# Patient Record
Sex: Female | Born: 1945 | Race: White | Hispanic: No | Smoking: Former smoker
Health system: Southern US, Community
[De-identification: ages and names within clinical notes are randomized; demographics above are authoritative.]

## PROBLEM LIST (undated history)

## (undated) DIAGNOSIS — I10 Essential (primary) hypertension: Secondary | ICD-10-CM

## (undated) DIAGNOSIS — F419 Anxiety disorder, unspecified: Secondary | ICD-10-CM

## (undated) DIAGNOSIS — I509 Heart failure, unspecified: Secondary | ICD-10-CM

## (undated) DIAGNOSIS — Z87891 Personal history of nicotine dependence: Principal | ICD-10-CM

## (undated) DIAGNOSIS — E785 Hyperlipidemia, unspecified: Secondary | ICD-10-CM

## (undated) DIAGNOSIS — J449 Chronic obstructive pulmonary disease, unspecified: Secondary | ICD-10-CM

## (undated) HISTORY — DX: Personal history of nicotine dependence: Z87.891

## (undated) HISTORY — DX: Essential (primary) hypertension: I10

## (undated) HISTORY — PX: OTHER SURGICAL HISTORY: SHX169

## (undated) HISTORY — PX: TUBAL LIGATION: SHX77

## (undated) HISTORY — PX: CATARACT EXTRACTION: SUR2

## (undated) HISTORY — DX: Heart failure, unspecified: I50.9

---

## 2004-12-01 ENCOUNTER — Ambulatory Visit: Payer: Self-pay

## 2005-01-19 ENCOUNTER — Ambulatory Visit: Payer: Self-pay

## 2005-12-18 ENCOUNTER — Ambulatory Visit: Payer: Self-pay | Admitting: Family Medicine

## 2006-12-23 ENCOUNTER — Ambulatory Visit: Payer: Self-pay | Admitting: Family Medicine

## 2008-01-20 ENCOUNTER — Ambulatory Visit: Payer: Self-pay | Admitting: Family Medicine

## 2009-05-18 ENCOUNTER — Ambulatory Visit: Payer: Self-pay | Admitting: Obstetrics and Gynecology

## 2010-12-05 ENCOUNTER — Ambulatory Visit: Payer: Self-pay | Admitting: Family Medicine

## 2011-01-30 ENCOUNTER — Ambulatory Visit: Payer: Self-pay | Admitting: Gastroenterology

## 2011-02-01 LAB — PATHOLOGY REPORT

## 2011-03-12 ENCOUNTER — Ambulatory Visit: Payer: Self-pay | Admitting: Gastroenterology

## 2011-03-14 LAB — PATHOLOGY REPORT

## 2012-01-08 ENCOUNTER — Ambulatory Visit: Payer: Self-pay | Admitting: Family Medicine

## 2013-01-15 ENCOUNTER — Ambulatory Visit: Payer: Self-pay | Admitting: Family Medicine

## 2014-03-15 ENCOUNTER — Ambulatory Visit: Payer: Self-pay | Admitting: Family Medicine

## 2014-03-25 ENCOUNTER — Ambulatory Visit: Payer: Self-pay | Admitting: Podiatry

## 2015-02-04 ENCOUNTER — Ambulatory Visit
Admit: 2015-02-04 | Disposition: A | Discharge status: Home / Self Care | Payer: Self-pay | Attending: Gastroenterology | Admitting: Gastroenterology

## 2015-02-11 ENCOUNTER — Other Ambulatory Visit: Payer: Self-pay | Admitting: Obstetrics and Gynecology

## 2015-02-11 DIAGNOSIS — Z1231 Encounter for screening mammogram for malignant neoplasm of breast: Secondary | ICD-10-CM

## 2015-02-18 ENCOUNTER — Ambulatory Visit
Admit: 2015-02-18 | Disposition: A | Discharge status: Home / Self Care | Payer: Self-pay | Attending: Gastroenterology | Admitting: Gastroenterology

## 2015-02-21 LAB — SURGICAL PATHOLOGY

## 2015-03-17 ENCOUNTER — Ambulatory Visit
Admission: RE | Admit: 2015-03-17 | Discharge: 2015-03-17 | Disposition: A | Discharge status: Home / Self Care | Payer: Medicare Other | Source: Ambulatory Visit | Attending: Obstetrics and Gynecology | Admitting: Obstetrics and Gynecology

## 2015-03-17 ENCOUNTER — Other Ambulatory Visit: Payer: Self-pay | Admitting: Obstetrics and Gynecology

## 2015-03-17 DIAGNOSIS — Z1231 Encounter for screening mammogram for malignant neoplasm of breast: Secondary | ICD-10-CM | POA: Insufficient documentation

## 2015-12-05 ENCOUNTER — Telehealth: Payer: Self-pay | Admitting: *Deleted

## 2015-12-05 NOTE — Telephone Encounter (Signed)
Received referral for low dose lung cancer screening CT scan from Dr. Feliberto Gottron . Voicemail left at phone number listed in EMR for patient to call me back to facilitate scheduling scan.

## 2015-12-06 ENCOUNTER — Other Ambulatory Visit: Payer: Self-pay | Admitting: Family Medicine

## 2015-12-06 ENCOUNTER — Encounter: Payer: Self-pay | Admitting: Family Medicine

## 2015-12-06 DIAGNOSIS — Z720 Tobacco use: Secondary | ICD-10-CM | POA: Insufficient documentation

## 2015-12-06 DIAGNOSIS — Z87891 Personal history of nicotine dependence: Secondary | ICD-10-CM

## 2015-12-06 HISTORY — DX: Personal history of nicotine dependence: Z87.891

## 2015-12-07 ENCOUNTER — Encounter: Payer: Self-pay | Admitting: Family Medicine

## 2015-12-07 ENCOUNTER — Inpatient Hospital Stay: Payer: Medicare Other | Attending: Family Medicine | Admitting: Family Medicine

## 2015-12-07 ENCOUNTER — Ambulatory Visit
Admission: RE | Admit: 2015-12-07 | Discharge: 2015-12-07 | Disposition: A | Discharge status: Home / Self Care | Payer: Medicare Other | Source: Ambulatory Visit | Attending: Family Medicine | Admitting: Family Medicine

## 2015-12-07 DIAGNOSIS — Z87891 Personal history of nicotine dependence: Secondary | ICD-10-CM

## 2015-12-07 DIAGNOSIS — Z122 Encounter for screening for malignant neoplasm of respiratory organs: Secondary | ICD-10-CM

## 2015-12-07 DIAGNOSIS — I2510 Atherosclerotic heart disease of native coronary artery without angina pectoris: Secondary | ICD-10-CM | POA: Insufficient documentation

## 2015-12-07 NOTE — Progress Notes (Signed)
In accordance with CMS guidelines, patient has meet eligibility criteria including age, absence of signs or symptoms of lung cancer, the specific calculation of cigarette smoking pack-years was 40 years and is a current smoker.   A shared decision-making session was conducted prior to the performance of CT scan. This includes one or more decision aids, includes benefits and harms of screening, follow-up diagnostic testing, over-diagnosis, false positive rate, and total radiation exposure.  Counseling on the importance of adherence to annual lung cancer LDCT screening, impact of co-morbidities, and ability or willingness to undergo diagnosis and treatment is imperative for compliance of the program.  Counseling on the importance of continued smoking cessation for former smokers; the importance of smoking cessation for current smokers and information about tobacco cessation interventions have been given to patient including the [place] [facility], [address] quit [place], as well as Cancer Center specific smoking cessation programs.  Written order for lung cancer screening with LDCT has been given to the patient and any and all questions have been answered to the best of my abilities.   Yearly follow up will be scheduled by Shawn Perkins, Thoracic Navigator.   

## 2015-12-09 ENCOUNTER — Telehealth: Payer: Self-pay | Admitting: *Deleted

## 2015-12-09 NOTE — Telephone Encounter (Signed)
Notified patient of LDCT lung cancer screening results of Lung Rads 1 finding with recommendation for 12 month follow up imaging. Also notified of incidental finding noted below. Patient verbalizes understanding.   IMPRESSION: 1. Lung-RADS Category 1, negative. Continue annual screening with low-dose chest CT without contrast in 12 months. 2. Coronary artery calcification.  

## 2016-04-09 ENCOUNTER — Other Ambulatory Visit: Payer: Self-pay | Admitting: Family Medicine

## 2016-04-09 DIAGNOSIS — Z1231 Encounter for screening mammogram for malignant neoplasm of breast: Secondary | ICD-10-CM

## 2016-04-18 ENCOUNTER — Ambulatory Visit
Admission: RE | Admit: 2016-04-18 | Discharge: 2016-04-18 | Disposition: A | Discharge status: Home / Self Care | Payer: Medicare Other | Source: Ambulatory Visit | Attending: Family Medicine | Admitting: Family Medicine

## 2016-04-18 ENCOUNTER — Other Ambulatory Visit: Payer: Self-pay | Admitting: Family Medicine

## 2016-04-18 DIAGNOSIS — Z1231 Encounter for screening mammogram for malignant neoplasm of breast: Secondary | ICD-10-CM

## 2016-11-20 ENCOUNTER — Telehealth: Payer: Self-pay | Admitting: *Deleted

## 2016-11-20 NOTE — Telephone Encounter (Signed)
Left voicemail for patient notifyng them that it is time to schedule annual low dose lung cancer screening CT scan. Instructed patient to call back to verify information prior to the scan being scheduled.  

## 2016-11-26 ENCOUNTER — Telehealth: Payer: Self-pay | Admitting: *Deleted

## 2016-11-26 DIAGNOSIS — Z87891 Personal history of nicotine dependence: Secondary | ICD-10-CM

## 2016-11-26 NOTE — Telephone Encounter (Signed)
Notified patient that annual lung cancer screening low dose CT scan is due. Confirmed that patient is within the age range of 55-77, and asymptomatic, (no signs or symptoms of lung cancer). Patient denies illness that would prevent curative treatment for lung cancer if found. The patient is a current smoker, with a 41 pack year history. The shared decision making visit was done 12/07/15. Patient is agreeable for CT scan being scheduled.

## 2016-12-10 ENCOUNTER — Ambulatory Visit: Payer: Medicare Other

## 2016-12-10 ENCOUNTER — Ambulatory Visit
Admission: RE | Admit: 2016-12-10 | Discharge: 2016-12-10 | Disposition: A | Discharge status: Home / Self Care | Payer: Medicare Other | Source: Ambulatory Visit | Attending: Oncology | Admitting: Oncology

## 2016-12-10 DIAGNOSIS — I2510 Atherosclerotic heart disease of native coronary artery without angina pectoris: Secondary | ICD-10-CM | POA: Insufficient documentation

## 2016-12-10 DIAGNOSIS — I700 Atherosclerosis of aorta: Secondary | ICD-10-CM | POA: Insufficient documentation

## 2016-12-10 DIAGNOSIS — J439 Emphysema, unspecified: Secondary | ICD-10-CM | POA: Insufficient documentation

## 2016-12-10 DIAGNOSIS — Z87891 Personal history of nicotine dependence: Secondary | ICD-10-CM

## 2016-12-14 ENCOUNTER — Encounter: Payer: Self-pay | Admitting: *Deleted

## 2017-03-21 ENCOUNTER — Other Ambulatory Visit: Payer: Self-pay | Admitting: Family Medicine

## 2017-03-21 DIAGNOSIS — Z1231 Encounter for screening mammogram for malignant neoplasm of breast: Secondary | ICD-10-CM

## 2017-04-19 ENCOUNTER — Ambulatory Visit
Admission: RE | Admit: 2017-04-19 | Discharge: 2017-04-19 | Disposition: A | Discharge status: Home / Self Care | Payer: Medicare Other | Source: Ambulatory Visit | Attending: Family Medicine | Admitting: Family Medicine

## 2017-04-19 DIAGNOSIS — Z1231 Encounter for screening mammogram for malignant neoplasm of breast: Secondary | ICD-10-CM | POA: Diagnosis present

## 2017-12-03 ENCOUNTER — Telehealth: Payer: Self-pay | Admitting: *Deleted

## 2017-12-03 DIAGNOSIS — Z87891 Personal history of nicotine dependence: Secondary | ICD-10-CM

## 2017-12-03 DIAGNOSIS — Z122 Encounter for screening for malignant neoplasm of respiratory organs: Secondary | ICD-10-CM

## 2017-12-03 NOTE — Telephone Encounter (Signed)
Notified patient that annual lung cancer screening low dose CT scan is due currently or will be in near future. Confirmed that patient is within the age range of 55-77, and asymptomatic, (no signs or symptoms of lung cancer). Patient denies illness that would prevent curative treatment for lung cancer if found. Verified smoking history, (current, 42 pack year). The shared decision making visit was done 12/07/15. Patient is agreeable for CT scan being scheduled.

## 2017-12-16 ENCOUNTER — Ambulatory Visit
Admission: RE | Admit: 2017-12-16 | Discharge: 2017-12-16 | Disposition: A | Discharge status: Home / Self Care | Payer: Medicare Other | Source: Ambulatory Visit | Attending: Nurse Practitioner | Admitting: Nurse Practitioner

## 2017-12-16 DIAGNOSIS — J439 Emphysema, unspecified: Secondary | ICD-10-CM | POA: Diagnosis not present

## 2017-12-16 DIAGNOSIS — Z87891 Personal history of nicotine dependence: Secondary | ICD-10-CM | POA: Diagnosis not present

## 2017-12-16 DIAGNOSIS — Z122 Encounter for screening for malignant neoplasm of respiratory organs: Secondary | ICD-10-CM | POA: Diagnosis not present

## 2017-12-16 DIAGNOSIS — I700 Atherosclerosis of aorta: Secondary | ICD-10-CM | POA: Insufficient documentation

## 2017-12-23 ENCOUNTER — Telehealth: Payer: Self-pay | Admitting: *Deleted

## 2017-12-23 NOTE — Telephone Encounter (Signed)
Notified patient of LDCT lung cancer screening program results with recommendation for 12 month follow up imaging. Also notified of incidental findings noted below and is encouraged to discuss further with PCP who will receive a copy of this note and/or the CT report. Patient verbalizes understanding.   IMPRESSION: 1. Lung-RADS 2, benign appearance or behavior. Continue annual screening with low-dose chest CT without contrast in 12 months. 2. Aortic atherosclerosis (ICD10-170.0). Coronary artery calcification. 3.  Emphysema (ICD10-J43.9). 

## 2017-12-31 ENCOUNTER — Inpatient Hospital Stay
Admission: EM | Admit: 2017-12-31 | Discharge: 2018-01-02 | DRG: 191 | Disposition: A | Discharge status: Home / Self Care | Payer: Medicare Other | Attending: Internal Medicine | Admitting: Internal Medicine

## 2017-12-31 ENCOUNTER — Emergency Department: Payer: Medicare Other

## 2017-12-31 ENCOUNTER — Encounter: Payer: Self-pay | Admitting: Emergency Medicine

## 2017-12-31 ENCOUNTER — Other Ambulatory Visit: Payer: Self-pay

## 2017-12-31 DIAGNOSIS — Z79899 Other long term (current) drug therapy: Secondary | ICD-10-CM

## 2017-12-31 DIAGNOSIS — E440 Moderate protein-calorie malnutrition: Secondary | ICD-10-CM | POA: Diagnosis present

## 2017-12-31 DIAGNOSIS — J209 Acute bronchitis, unspecified: Secondary | ICD-10-CM | POA: Diagnosis present

## 2017-12-31 DIAGNOSIS — F17210 Nicotine dependence, cigarettes, uncomplicated: Secondary | ICD-10-CM | POA: Diagnosis present

## 2017-12-31 DIAGNOSIS — E871 Hypo-osmolality and hyponatremia: Secondary | ICD-10-CM | POA: Diagnosis present

## 2017-12-31 DIAGNOSIS — R739 Hyperglycemia, unspecified: Secondary | ICD-10-CM | POA: Diagnosis present

## 2017-12-31 DIAGNOSIS — J440 Chronic obstructive pulmonary disease with acute lower respiratory infection: Secondary | ICD-10-CM | POA: Diagnosis present

## 2017-12-31 DIAGNOSIS — T380X5A Adverse effect of glucocorticoids and synthetic analogues, initial encounter: Secondary | ICD-10-CM | POA: Diagnosis present

## 2017-12-31 DIAGNOSIS — E785 Hyperlipidemia, unspecified: Secondary | ICD-10-CM | POA: Diagnosis present

## 2017-12-31 DIAGNOSIS — Z681 Body mass index (BMI) 19 or less, adult: Secondary | ICD-10-CM

## 2017-12-31 DIAGNOSIS — I10 Essential (primary) hypertension: Secondary | ICD-10-CM | POA: Diagnosis present

## 2017-12-31 DIAGNOSIS — F419 Anxiety disorder, unspecified: Secondary | ICD-10-CM | POA: Diagnosis present

## 2017-12-31 DIAGNOSIS — J441 Chronic obstructive pulmonary disease with (acute) exacerbation: Principal | ICD-10-CM

## 2017-12-31 DIAGNOSIS — Z720 Tobacco use: Secondary | ICD-10-CM | POA: Diagnosis present

## 2017-12-31 HISTORY — DX: Hyperlipidemia, unspecified: E78.5

## 2017-12-31 HISTORY — DX: Anxiety disorder, unspecified: F41.9

## 2017-12-31 HISTORY — DX: Chronic obstructive pulmonary disease, unspecified: J44.9

## 2017-12-31 LAB — CBC WITH DIFFERENTIAL/PLATELET
BASOS PCT: 1 %
Basophils Absolute: 0.1 10*3/uL (ref 0–0.1)
EOS ABS: 0 10*3/uL (ref 0–0.7)
EOS PCT: 0 %
HCT: 42.9 % (ref 35.0–47.0)
HEMOGLOBIN: 15.1 g/dL (ref 12.0–16.0)
Lymphocytes Relative: 6 %
Lymphs Abs: 0.7 10*3/uL — ABNORMAL LOW (ref 1.0–3.6)
MCH: 33.3 pg (ref 26.0–34.0)
MCHC: 35.3 g/dL (ref 32.0–36.0)
MCV: 94.4 fL (ref 80.0–100.0)
MONO ABS: 0.9 10*3/uL (ref 0.2–0.9)
Monocytes Relative: 8 %
NEUTROS PCT: 85 %
Neutro Abs: 10.4 10*3/uL — ABNORMAL HIGH (ref 1.4–6.5)
PLATELETS: 233 10*3/uL (ref 150–440)
RBC: 4.54 MIL/uL (ref 3.80–5.20)
RDW: 13.5 % (ref 11.5–14.5)
WBC: 12.1 10*3/uL — ABNORMAL HIGH (ref 3.6–11.0)

## 2017-12-31 MED ORDER — IPRATROPIUM-ALBUTEROL 0.5-2.5 (3) MG/3ML IN SOLN
3.0000 mL | Freq: Once | RESPIRATORY_TRACT | Status: AC
  Administered 2017-12-31: 3 mL via RESPIRATORY_TRACT

## 2017-12-31 MED ORDER — METHYLPREDNISOLONE SODIUM SUCC 125 MG IJ SOLR
125.0000 mg | Freq: Once | INTRAMUSCULAR | Status: AC
  Administered 2017-12-31: 125 mg via INTRAVENOUS

## 2017-12-31 MED ORDER — METHYLPREDNISOLONE SODIUM SUCC 125 MG IJ SOLR
INTRAMUSCULAR | Status: AC
  Administered 2017-12-31: 125 mg via INTRAVENOUS
  Filled 2017-12-31: qty 2

## 2017-12-31 NOTE — ED Triage Notes (Signed)
Pt with history of COPD arrived to the ED accompanied by friend for complaints of shortness of breath. Pt reports that she has been experiencing difficulty breathing. Pt arrived using accessory muscles and having retractions with O2 sats on 88% on room air. Pt was brought straight back to the room, Dr. Manson PasseyBrown at bedside.

## 2017-12-31 NOTE — ED Provider Notes (Signed)
[facility] Emergency Department Provider Note  ____________________________________________   First MD Initiated Contact with Patient 12/31/17 2315     (approximate)  I have reviewed the triage vital signs and the nursing notes.   HISTORY  Chief Complaint Shortness of Breath   HPI Ashley Brooks is a 72 y.o. female with history of COPD presents to the emergency department with a 1 day history of progressive dyspnea.  Patient currently speaking 2 word phrases with accessory respiratory muscle use and as such history limited at this time.  Patient however denies any fever no known sick contact.   Past Medical History:  Diagnosis Date  . Anxiety   . COPD (chronic obstructive pulmonary disease) (HCC)   . HLD (hyperlipidemia)   . Personal history of tobacco use, presenting hazards to health 12/06/2015    Patient Active Problem List   Diagnosis Date Noted  . COPD with acute exacerbation (HCC) 01/01/2018  . Anxiety 01/01/2018  . HLD (hyperlipidemia) 01/01/2018  . Tobacco use 12/06/2015    Past Surgical History:  Procedure Laterality Date  . CATARACT EXTRACTION    . right foot fusion    . TUBAL LIGATION      Prior to Admission medications   Medication Sig Start Date End Date Taking? Authorizing Provider  ALPRAZolam Prudy Feeler) 0.5 MG tablet Take 0.25 mg by mouth as needed. 04/08/17  Yes [provider]  FLUoxetine (PROZAC) 40 MG capsule TAKE ONE CAPSULE BY MOUTH DAILY 10/01/17  Yes [provider]  Multiple Vitamins-Minerals (ICAPS AREDS 2) CAPS Take 2 capsules by mouth daily.   Yes [provider]  simvastatin (ZOCOR) 40 MG tablet Take 1 tablet by mouth at bedtime. 08/28/16  Yes [provider]  albuterol (PROVENTIL HFA) 108 (90 Base) MCG/ACT inhaler Inhale 2 puffs into the lungs every 4 (four) hours. 04/08/17   [provider]    Allergies No known drug allergies  Family History  Problem Relation Age  of Onset  . Colon cancer Mother   . Breast cancer Neg Hx     Social History Social History   Tobacco Use  . Smoking status: Current Every Day Smoker    Packs/day: 1.00    Years: 50.00    Pack years: 50.00  . Smokeless tobacco: Never Used  Substance Use Topics  . Alcohol use: No    Frequency: Never  . Drug use: No    Review of Systems Constitutional: No fever/chills Eyes: No visual changes. ENT: No sore throat. Cardiovascular: Denies chest pain. Respiratory: Positive for shortness of breath. Gastrointestinal: No abdominal pain.  No nausea, no vomiting.  No diarrhea.  No constipation. Genitourinary: Negative for dysuria. Musculoskeletal: Negative for neck pain.  Negative for back pain. Integumentary: Negative for rash. Neurological: Negative for headaches, focal weakness or numbness.   ____________________________________________   PHYSICAL EXAM:  VITAL SIGNS: ED Triage Vitals  Enc Vitals Group     BP      Pulse      Resp      Temp      Temp src      SpO2      Weight      Height      Head Circumference      Peak Flow      Pain Score      Pain Loc      Pain Edu?      Excl. in GC?     Constitutional: Alert and oriented.  Apparent respiratory distress  eyes: Conjunctivae are normal.  Head: Atraumatic. Mouth/Throat: Mucous membranes are moist. Oropharynx non-erythematous. Neck: No stridor.   Cardiovascular: Normal rate, regular rhythm. Good peripheral circulation. Grossly normal heart sounds. Respiratory: Tachypnea, positive accessory respiratory muscle use, speaking 2-3 word phrases.  Gastrointestinal: Soft and nontender. No distention.  Musculoskeletal: No lower extremity tenderness nor edema. No gross deformities of extremities. Neurologic:  Normal speech and language. No gross focal neurologic deficits are appreciated.  Skin:  Skin is warm, dry and intact. No rash noted. Psychiatric: Mood and affect are normal. Speech and behavior are  normal.  ____________________________________________   LABS (all labs ordered are listed, but only abnormal results are displayed)  Labs Reviewed  CBC WITH DIFFERENTIAL/PLATELET - Abnormal; Notable for the following components:      Result Value   WBC 12.1 (*)    Neutro Abs 10.4 (*)    Lymphs Abs 0.7 (*)    All other components within normal limits  BASIC METABOLIC PANEL - Abnormal; Notable for the following components:   Sodium 129 (*)    Potassium 3.4 (*)    Chloride 92 (*)    Glucose, Bld 152 (*)    All other components within normal limits  CBC - Abnormal; Notable for the following components:   Platelets 128 (*)    All other components within normal limits  BASIC METABOLIC PANEL - Abnormal; Notable for the following components:   Sodium 129 (*)    Chloride 95 (*)    Glucose, Bld 160 (*)    Calcium 8.6 (*)    All other components within normal limits  TROPONIN I  INFLUENZA PANEL BY PCR (TYPE A & B)  BRAIN NATRIURETIC PEPTIDE  CREATININE, SERUM   ____________________________________________  EKG  ED ECG REPORT I, Mulhall N Timera Windt, the attending physician, personally viewed and interpreted this ECG.   Date: 12/31/2017  EKG Time: 11:21 PM  Rate: 101  Rhythm: Normal sinus rhythm  Axis: Normal  Intervals: Normal  ST&T Change: None  ____________________________________________  RADIOLOGY I, Nondalton N Errin Chewning, personally viewed and evaluated these images (plain radiographs) as part of my medical decision making, as well as reviewing the written report by the radiologist.  ED MD interpretation: No acute pulmonary findings  Official radiology report(s): Dg Chest Port 1 View  Result Date: 12/31/2017 CLINICAL DATA:  Shortness of breath.  Dyspnea. EXAM: PORTABLE CHEST 1 VIEW COMPARISON:  Chest CT 12/16/2017 FINDINGS: The lungs are hyperinflated. Emphysema on prior CT not well demonstrated radiographically. The cardiomediastinal contours are normal. Pulmonary  vasculature is normal. No consolidation, pleural effusion, or pneumothorax. No acute osseous abnormalities are seen. Remote left lower rib fractures. IMPRESSION: Chronic hyperinflation.  No acute abnormality. Electronically Signed   By: Rubye OaksMelanie  Ehinger M.D.   On: 12/31/2017 23:45    ____________________________________________   PROCEDURES  Critical Care performed: CRITICAL CARE Performed by: Darci CurrentANDOLPH N Eirene Rather   Total critical care time: 45 minutes  Critical care time was exclusive of separately billable procedures and treating other patients.  Critical care was necessary to treat or prevent imminent or life-threatening deterioration.  Critical care was time spent personally by me on the following activities: development of treatment plan with patient and/or surrogate as well as nursing, discussions with consultants, evaluation of patient's response to treatment, examination of patient, obtaining history from patient or surrogate, ordering and performing treatments and interventions, ordering and review of laboratory studies, ordering and review of radiographic studies, pulse oximetry and re-evaluation of patient's  condition.   Procedures   ____________________________________________   INITIAL IMPRESSION / ASSESSMENT AND PLAN / ED COURSE  As part of my medical decision making, I reviewed the following data within the electronic MEDICAL RECORD NUMBER   72 year old female presenting emerged department with above-stated history and physical exam consistent with possible COPD exacerbation versus pneumonia.Given presentation of respiratory distress Duoneb x 3 given Solu-Medrol 125 mg.  BiPAP applied to the patient as well given increased work of breathing.  Following these interventions patient's work of breathing dramatically improved no longer tachypneic no longer requiring accessory respiratory muscle use.  Patient discussed with Dr. Izola Price for hospital admission for further evaluation and  management for COPD exacerbation as chest x-ray did not reveal any evidence of pneumonia    ____________________________________________  FINAL CLINICAL IMPRESSION(S) / ED DIAGNOSES  Final diagnoses:  COPD exacerbation (HCC)     MEDICATIONS GIVEN DURING THIS VISIT:  Medications  ipratropium-albuterol (DUONEB) 0.5-2.5 (3) MG/3ML nebulizer solution 3 mL (not administered)  methylPREDNISolone sodium succinate (SOLU-MEDROL) 125 mg/2 mL injection 60 mg (60 mg Intravenous Given 01/01/18 0506)  ALPRAZolam (XANAX) tablet 0.25 mg (not administered)  FLUoxetine (PROZAC) capsule 40 mg (not administered)  simvastatin (ZOCOR) tablet 40 mg (not administered)  enoxaparin (LOVENOX) injection 40 mg (not administered)  acetaminophen (TYLENOL) tablet 650 mg (not administered)    Or  acetaminophen (TYLENOL) suppository 650 mg (not administered)  ondansetron (ZOFRAN) tablet 4 mg (not administered)    Or  ondansetron (ZOFRAN) injection 4 mg (not administered)  azithromycin (ZITHROMAX) tablet 500 mg (500 mg Oral Given 01/01/18 0325)  methylPREDNISolone sodium succinate (SOLU-MEDROL) 125 mg/2 mL injection 125 mg (125 mg Intravenous Given 12/31/17 2335)  ipratropium-albuterol (DUONEB) 0.5-2.5 (3) MG/3ML nebulizer solution 3 mL (3 mLs Nebulization Given 12/31/17 2334)  ipratropium-albuterol (DUONEB) 0.5-2.5 (3) MG/3ML nebulizer solution 3 mL (3 mLs Nebulization Given 12/31/17 2334)  ipratropium-albuterol (DUONEB) 0.5-2.5 (3) MG/3ML nebulizer solution 3 mL (3 mLs Nebulization Given 12/31/17 2334)     ED Discharge Orders    None       Note:  This document was prepared using Dragon voice recognition software and may include unintentional dictation errors.    Darci Current, MD 01/01/18 [phone]

## 2018-01-01 ENCOUNTER — Other Ambulatory Visit: Payer: Self-pay

## 2018-01-01 ENCOUNTER — Encounter: Payer: Self-pay | Admitting: Internal Medicine

## 2018-01-01 DIAGNOSIS — E785 Hyperlipidemia, unspecified: Secondary | ICD-10-CM | POA: Diagnosis present

## 2018-01-01 DIAGNOSIS — I10 Essential (primary) hypertension: Secondary | ICD-10-CM | POA: Diagnosis present

## 2018-01-01 DIAGNOSIS — F419 Anxiety disorder, unspecified: Secondary | ICD-10-CM | POA: Diagnosis present

## 2018-01-01 DIAGNOSIS — Z681 Body mass index (BMI) 19 or less, adult: Secondary | ICD-10-CM | POA: Diagnosis not present

## 2018-01-01 DIAGNOSIS — E440 Moderate protein-calorie malnutrition: Secondary | ICD-10-CM | POA: Diagnosis present

## 2018-01-01 DIAGNOSIS — Z79899 Other long term (current) drug therapy: Secondary | ICD-10-CM | POA: Diagnosis not present

## 2018-01-01 DIAGNOSIS — J441 Chronic obstructive pulmonary disease with (acute) exacerbation: Secondary | ICD-10-CM | POA: Diagnosis present

## 2018-01-01 DIAGNOSIS — E871 Hypo-osmolality and hyponatremia: Secondary | ICD-10-CM | POA: Diagnosis present

## 2018-01-01 DIAGNOSIS — J440 Chronic obstructive pulmonary disease with acute lower respiratory infection: Secondary | ICD-10-CM | POA: Diagnosis present

## 2018-01-01 DIAGNOSIS — T380X5A Adverse effect of glucocorticoids and synthetic analogues, initial encounter: Secondary | ICD-10-CM | POA: Diagnosis present

## 2018-01-01 DIAGNOSIS — R739 Hyperglycemia, unspecified: Secondary | ICD-10-CM | POA: Diagnosis present

## 2018-01-01 DIAGNOSIS — F17210 Nicotine dependence, cigarettes, uncomplicated: Secondary | ICD-10-CM | POA: Diagnosis present

## 2018-01-01 DIAGNOSIS — J209 Acute bronchitis, unspecified: Secondary | ICD-10-CM | POA: Diagnosis present

## 2018-01-01 LAB — BASIC METABOLIC PANEL
ANION GAP: 12 (ref 5–15)
Anion gap: 12 (ref 5–15)
BUN: 8 mg/dL (ref 6–20)
BUN: 8 mg/dL (ref 6–20)
CALCIUM: 8.6 mg/dL — AB (ref 8.9–10.3)
CO2: 25 mmol/L (ref 22–32)
CO2: 22 mmol/L (ref 22–32)
CREATININE: 0.61 mg/dL (ref 0.44–1.00)
Calcium: 9 mg/dL (ref 8.9–10.3)
Chloride: 92 mmol/L — ABNORMAL LOW (ref 101–111)
Chloride: 95 mmol/L — ABNORMAL LOW (ref 101–111)
Creatinine, Ser: 0.55 mg/dL (ref 0.44–1.00)
GFR calc Af Amer: >60 mL/min (ref >60)
GFR calc Af Amer: >60 mL/min (ref >60)
GFR calc non Af Amer: >60 mL/min (ref >60)
GFR calc non Af Amer: >60 mL/min (ref >60)
GLUCOSE: 152 mg/dL — AB (ref 65–99)
Glucose, Bld: 160 mg/dL — ABNORMAL HIGH (ref 65–99)
POTASSIUM: 3.4 mmol/L — AB (ref 3.5–5.1)
Potassium: 3.7 mmol/L (ref 3.5–5.1)
SODIUM: 129 mmol/L — AB (ref 135–145)
Sodium: 129 mmol/L — ABNORMAL LOW (ref 135–145)

## 2018-01-01 LAB — TROPONIN I

## 2018-01-01 LAB — GLUCOSE, CAPILLARY
Glucose-Capillary: 135 mg/dL — ABNORMAL HIGH (ref 65–99)
Glucose-Capillary: 255 mg/dL — ABNORMAL HIGH (ref 65–99)

## 2018-01-01 LAB — CBC
HEMATOCRIT: 39.5 % (ref 35.0–47.0)
HEMOGLOBIN: 13.9 g/dL (ref 12.0–16.0)
MCH: 33.4 pg (ref 26.0–34.0)
MCHC: 35.1 g/dL (ref 32.0–36.0)
MCV: 95.1 fL (ref 80.0–100.0)
Platelets: 128 10*3/uL — ABNORMAL LOW (ref 150–440)
RBC: 4.16 MIL/uL (ref 3.80–5.20)
RDW: 13.4 % (ref 11.5–14.5)
WBC: 8 10*3/uL (ref 3.6–11.0)

## 2018-01-01 LAB — INFLUENZA PANEL BY PCR (TYPE A & B)
Influenza A By PCR: NEGATIVE
Influenza B By PCR: NEGATIVE

## 2018-01-01 LAB — BRAIN NATRIURETIC PEPTIDE: B Natriuretic Peptide: 36 pg/mL (ref 0.0–100.0)

## 2018-01-01 LAB — CREATININE, SERUM
Creatinine, Ser: 0.46 mg/dL (ref 0.44–1.00)
GFR calc Af Amer: >60 mL/min (ref >60)
GFR calc non Af Amer: >60 mL/min (ref >60)

## 2018-01-01 LAB — HEMOGLOBIN A1C
Hgb A1c MFr Bld: 4.7 % — ABNORMAL LOW (ref 4.8–5.6)
MEAN PLASMA GLUCOSE: 88.19 mg/dL

## 2018-01-01 MED ORDER — ONDANSETRON HCL 4 MG PO TABS: 4.0000 mg | ORAL_TABLET | Freq: Four times a day (QID) | ORAL | Status: DC | PRN

## 2018-01-01 MED ORDER — INSULIN ASPART 100 UNIT/ML ~~LOC~~ SOLN: 0.0000 [IU] | Freq: Every day | SUBCUTANEOUS | Status: DC

## 2018-01-01 MED ORDER — SODIUM CHLORIDE 0.9 % IV SOLN
INTRAVENOUS | Status: DC
  Administered 2018-01-01 – 2018-01-02 (×2): via INTRAVENOUS

## 2018-01-01 MED ORDER — INSULIN ASPART 100 UNIT/ML ~~LOC~~ SOLN
0.0000 [IU] | Freq: Three times a day (TID) | SUBCUTANEOUS | Status: DC
  Administered 2018-01-01: 1 [IU] via SUBCUTANEOUS
  Administered 2018-01-02 (×2): 2 [IU] via SUBCUTANEOUS
  Filled 2018-01-01 (×3): qty 1

## 2018-01-01 MED ORDER — ACETAMINOPHEN 650 MG RE SUPP: 650.0000 mg | Freq: Four times a day (QID) | RECTAL | Status: DC | PRN

## 2018-01-01 MED ORDER — SIMVASTATIN 20 MG PO TABS
40.0000 mg | ORAL_TABLET | Freq: Every day | ORAL | Status: DC
  Administered 2018-01-01: 40 mg via ORAL
  Filled 2018-01-01: qty 2

## 2018-01-01 MED ORDER — GUAIFENESIN ER 600 MG PO TB12
600.0000 mg | ORAL_TABLET | Freq: Two times a day (BID) | ORAL | Status: DC
  Administered 2018-01-01 – 2018-01-02 (×3): 600 mg via ORAL
  Filled 2018-01-01 (×3): qty 1

## 2018-01-01 MED ORDER — IPRATROPIUM-ALBUTEROL 0.5-2.5 (3) MG/3ML IN SOLN
3.0000 mL | RESPIRATORY_TRACT | Status: DC | PRN
  Administered 2018-01-01: 3 mL via RESPIRATORY_TRACT
  Filled 2018-01-01: qty 3

## 2018-01-01 MED ORDER — AZITHROMYCIN 500 MG PO TABS
500.0000 mg | ORAL_TABLET | Freq: Every day | ORAL | Status: DC
  Administered 2018-01-01 – 2018-01-02 (×2): 500 mg via ORAL
  Filled 2018-01-01 (×3): qty 1

## 2018-01-01 MED ORDER — ENSURE ENLIVE PO LIQD
237.0000 mL | Freq: Two times a day (BID) | ORAL | Status: DC
  Administered 2018-01-02 (×2): 237 mL via ORAL

## 2018-01-01 MED ORDER — METHYLPREDNISOLONE SODIUM SUCC 125 MG IJ SOLR
60.0000 mg | Freq: Four times a day (QID) | INTRAMUSCULAR | Status: DC
  Administered 2018-01-01 – 2018-01-02 (×5): 60 mg via INTRAVENOUS
  Filled 2018-01-01 (×5): qty 2

## 2018-01-01 MED ORDER — ONDANSETRON HCL 4 MG/2ML IJ SOLN: 4.0000 mg | Freq: Four times a day (QID) | INTRAMUSCULAR | Status: DC | PRN

## 2018-01-01 MED ORDER — ADULT MULTIVITAMIN W/MINERALS CH
1.0000 | ORAL_TABLET | Freq: Every day | ORAL | Status: DC
  Administered 2018-01-02: 1 via ORAL
  Filled 2018-01-01: qty 1

## 2018-01-01 MED ORDER — ENOXAPARIN SODIUM 30 MG/0.3ML ~~LOC~~ SOLN
30.0000 mg | SUBCUTANEOUS | Status: DC
  Administered 2018-01-01: 30 mg via SUBCUTANEOUS
  Filled 2018-01-01: qty 0.3

## 2018-01-01 MED ORDER — ALPRAZOLAM 0.5 MG PO TABS: 0.2500 mg | ORAL_TABLET | Freq: Three times a day (TID) | ORAL | Status: DC | PRN

## 2018-01-01 MED ORDER — NICOTINE 21 MG/24HR TD PT24
21.0000 mg | MEDICATED_PATCH | Freq: Every day | TRANSDERMAL | Status: DC
  Administered 2018-01-01 – 2018-01-02 (×2): 21 mg via TRANSDERMAL
  Filled 2018-01-01 (×2): qty 1

## 2018-01-01 MED ORDER — ENOXAPARIN SODIUM 40 MG/0.4ML ~~LOC~~ SOLN: 40.0000 mg | SUBCUTANEOUS | Status: DC

## 2018-01-01 MED ORDER — ACETAMINOPHEN 325 MG PO TABS
650.0000 mg | ORAL_TABLET | Freq: Four times a day (QID) | ORAL | Status: DC | PRN
  Administered 2018-01-01: 650 mg via ORAL
  Filled 2018-01-01: qty 2

## 2018-01-01 MED ORDER — FLUOXETINE HCL 20 MG PO CAPS
40.0000 mg | ORAL_CAPSULE | Freq: Every day | ORAL | Status: DC
  Administered 2018-01-01 – 2018-01-02 (×2): 40 mg via ORAL
  Filled 2018-01-01 (×2): qty 2

## 2018-01-01 NOTE — Care Management (Signed)
Met with patient at bedside to discuss discharge planning. She lives at home with her boyfriend. She is independent, active and drive. No home O2. PCP is Dr. Bronstein. I do not anticipate any discharge needs at this time 

## 2018-01-01 NOTE — Progress Notes (Signed)
Anticoagulation monitoring(Lovenox):  72 yo  female ordered Lovenox 40 mg Q24h  Filed Weights   12/31/17 2329 01/01/18 0304  Weight: 102 lb (46.3 kg) 94 lb 4.8 oz (42.8 kg)   Body mass index is 18.42 kg/m.   Lab Results  Component Value Date   CREATININE 0.46 01/01/2018   CREATININE 0.61 01/01/2018   CREATININE 0.55 12/31/2017   Estimated Creatinine Clearance: 43.6 mL/min (by C-G formula based on SCr of 0.46 mg/dL). Hemoglobin & Hematocrit     Component Value Date/Time   HGB 13.9 01/01/2018 0319   HCT 39.5 01/01/2018 0319     Per Protocol for Patient with estCrcl > 30 ml/min and wt now <45 kg, will transition to Lovenox 30 mg Q24h.

## 2018-01-01 NOTE — Progress Notes (Signed)
[facility] [place] [facility]   PATIENT NAME: Ashley Brooks    MR#:  [id number]  DATE OF BIRTH:  [birth year]  SUBJECTIVE:  CHIEF COMPLAINT:   Chief Complaint  Patient presents with  . Shortness of Breath   - Just admitted this morning for COPD exacerbation with bronchitis. On 2 L oxygen which is acute. Complains of cough  REVIEW OF SYSTEMS:  Review of Systems  Constitutional: Positive for malaise/fatigue. Negative for chills and fever.  HENT: Positive for hearing loss. Negative for ear discharge and nosebleeds.   Respiratory: Positive for cough, sputum production, shortness of breath and wheezing.   Cardiovascular: Negative for chest pain, palpitations and leg swelling.  Gastrointestinal: Negative for abdominal pain, constipation, diarrhea, nausea and vomiting.  Genitourinary: Negative for dysuria.  Musculoskeletal: Negative for myalgias and neck pain.  Neurological: Negative for dizziness, speech change, focal weakness, seizures and headaches.  Psychiatric/Behavioral: Negative for depression.    DRUG ALLERGIES:  No Known Allergies  VITALS:  Blood pressure 139/65, pulse 91, temperature 97.8 F (36.6 C), resp. rate 19, height 5' (1.524 m), weight 42.8 kg (94 lb 4.8 oz), SpO2 92 %.  PHYSICAL EXAMINATION:  Physical Exam  GENERAL:  72 y.o.-year-old patient lying in the bed with no acute distress.  EYES: Pupils equal, round, reactive to light and accommodation. No scleral icterus. Extraocular muscles intact.  HEENT: Head atraumatic, normocephalic. Oropharynx and nasopharynx clear.  NECK:  Supple, no jugular venous distention. No thyroid enlargement, no tenderness.  LUNGS: diffuse scattered expiratory wheezing all over the lung fields. no rales,rhonchi or crepitation. No use of accessory muscles of respiration.  CARDIOVASCULAR: S1, S2 normal. No   rubs, or gallops. 2/6 systolic murmur present. ABDOMEN: Soft, nontender, nondistended. Bowel sounds  present. No organomegaly or mass.  EXTREMITIES: No pedal edema, cyanosis, or clubbing.  NEUROLOGIC: Cranial nerves II through XII are intact. Muscle strength 5/5 in all extremities. Sensation intact. Gait not checked.  PSYCHIATRIC: The patient is alert and oriented x 3.  SKIN: No obvious rash, lesion, or ulcer.    LABORATORY PANEL:   CBC Recent Labs  Lab 01/01/18 0319  WBC 8.0  HGB 13.9  HCT 39.5  PLT 128*   ------------------------------------------------------------------------------------------------------------------  Chemistries  Recent Labs  Lab 01/01/18 0319  NA 129*  K 3.7  CL 95*  CO2 22  GLUCOSE 160*  BUN 8  CREATININE 0.46  0.61  CALCIUM 8.6*   ------------------------------------------------------------------------------------------------------------------  Cardiac Enzymes Recent Labs  Lab 12/31/17 2324  TROPONINI <0.03   ------------------------------------------------------------------------------------------------------------------  RADIOLOGY:  Dg Chest Port 1 View  Result Date: 12/31/2017 CLINICAL DATA:  Shortness of breath.  Dyspnea. EXAM: PORTABLE CHEST 1 VIEW COMPARISON:  Chest CT 12/16/2017 FINDINGS: The lungs are hyperinflated. Emphysema on prior CT not well demonstrated radiographically. The cardiomediastinal contours are normal. Pulmonary vasculature is normal. No consolidation, pleural effusion, or pneumothorax. No acute osseous abnormalities are seen. Remote left lower rib fractures. IMPRESSION: Chronic hyperinflation.  No acute abnormality. Electronically Signed   By: Rubye OaksMelanie  Ehinger M.D.   On: 12/31/2017 23:45    EKG:   Orders placed or performed during the hospital encounter of 12/31/17  . ED EKG  . ED EKG  . EKG 12-Lead  . EKG 12-Lead    ASSESSMENT AND PLAN:   72 year old female with past medical history significant for COPD, hypertension, smoking, anxiety presents to hospital secondary to worsening shortness of breath and  congestion.  1. Acute on chronic COPD exacerbation with acute bronchitis-was  initially tachypneic on presentation to the ED and hypoxic. Required BiPAP for a very brief period of time. -Currently on 2 L per nasal cannula. Continue to work on weaning off the oxygen -Still has diffuse scattered wheezing. Continue Solu-Medrol, duo nebs. -Patient not on any maintenance inhalers at home. Will need a prescription for some at discharge. -On azithromycin for bronchitis  2. Tobacco use disorder-counseled strongly. - nicotine patch while in the hospital  3. Hyponatremia-dehydrated, gentle IV hydration for 1 day  4. Depression and anxiety-on Prozac  5. DVT prophylaxis-Lovenox   Patient ambulating well to the bathroom by herself. No indication for physical therapy consult   All the records are reviewed and case discussed with Care Management/Social Workerr. Management plans discussed with the patient, family and they are in agreement.  CODE STATUS: Full code  TOTAL TIME TAKING CARE OF THIS PATIENT: 36 minutes.   POSSIBLE D/C IN 1-2 DAYS, DEPENDING ON CLINICAL CONDITION.   Enid Baas M.D on 01/01/2018 at 8:48 AM  Between 7am to 6pm - Pager - [phone]  After 6pm go to [email or web address] - Social research officer, government  [facility]  Office  [phone]  CC: Primary care physician; Dorothey Baseman, MD

## 2018-01-01 NOTE — Progress Notes (Signed)
Initial Nutrition Assessment  DOCUMENTATION CODES:   Non-severe (moderate) malnutrition in context of chronic illness, Underweight  INTERVENTION:  Recommend liberalizing diet to regular.  Provide Ensure Enlive po BID, each supplement provides 350 kcal and 20 grams of protein. Patient prefers chocolate or vanilla.  Provide Magic cup TID with meals, each supplement provides 290 kcal and 9 grams of protein.  Provide daily MVI.   Encouraged adequate intake of calories and protein from meals, snacks, and oral nutrition supplements.  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  GOAL:   Patient will meet greater than or equal to 90% of their needs  MONITOR:   PO intake, Supplement acceptance, Labs, Weight trends, I & O's  REASON FOR ASSESSMENT:   Other (Comment)(Low BMI)    ASSESSMENT:   72 year old female with PMHx of anxiety, COPD, HLD who is admitted with acute exacerbation of COPD and bronchitis.   Met with patient at bedside. She reports she has had a poor appetite for a while now. She is only eating one meal per day now. It is usually bites of a meal or a bottle of Ensure. She reports something "devastating" has happened and now she just does not have an appetite. She had a few bites of eggs this morning for breakfast and some bites of chicken at lunch.   Her UBW was 105-110 lbs. Per chart she was 102.8 lbs on 04/08/2017 and has lost 8.5 lbs (8.3% body weight) over 9 months, which is not significant for time frame.  Medications reviewed and include: azithromycin, Novolog 0-9 units TID, Novolog 0-5 units QHS, Solu-Medrol 60 mg Q6hrs IV, NS @ 60 mL/hr.  Labs reviewed: Sodium 129, Chloride 95.  NUTRITION - FOCUSED PHYSICAL EXAM:    Most Recent Value  Orbital Region  Moderate depletion  Upper Arm Region  Severe depletion  Thoracic and Lumbar Region  Moderate depletion   Buccal Region  Moderate depletion  Temple Region  Moderate depletion  Clavicle Bone Region  Moderate depletion  Clavicle and Acromion Bone Region  Moderate depletion  Scapular Bone Region  Moderate depletion  Dorsal Hand  Severe depletion  Patellar Region  Moderate depletion  Anterior Thigh Region  Moderate depletion  Posterior Calf Region  Moderate depletion  Edema (RD Assessment)  None  Hair  Reviewed  Eyes  Reviewed  Mouth  Reviewed  Skin  Reviewed  Nails  Reviewed     Diet Order:  Diet Heart Room service appropriate? Yes; Fluid consistency: Thin  EDUCATION NEEDS:   Education needs have been addressed  Skin:  Skin Assessment: Reviewed RN Assessment  Last BM:  01/01/2018 - large type 6  Height:   Ht Readings from Last 1 Encounters:  01/01/18 5' (1.524 m)    Weight:   Wt Readings from Last 1 Encounters:  01/01/18 94 lb 4.8 oz (42.8 kg)    Ideal Body Weight:  45.5 kg  BMI:  Body mass index is 18.42 kg/m.  Estimated Nutritional Needs:   Kcal:  1285-1500 (30-35 kcal/kg)  Protein:  65-73 grams (1.5-1.7 grams/kg)  Fluid:  1.2-1.5 L/day (1 mL/kcal)  Willey Blade, MS, RD, LDN Office: [phone] Pager: [phone] After Hours/Weekend Pager: [phone]

## 2018-01-01 NOTE — Care Management Obs Status (Signed)
MEDICARE OBSERVATION STATUS NOTIFICATION   Patient Details  Name: Ashley Brooks MRN: [id number] Date of Birth: [birth year]   Medicare Observation Status Notification Given:  Yes    Marily MemosLisa M Lalo Tromp, RN 01/01/2018, 9:53 AM

## 2018-01-01 NOTE — H&P (Signed)
[facility] [place] at [facility]   PATIENT NAME: Ashley Brooks    MR#:  [id number]  DATE OF BIRTH:  [birth year]  DATE OF ADMISSION:  12/31/2017  PRIMARY CARE PHYSICIAN: Dorothey Baseman, MD   REQUESTING/REFERRING PHYSICIAN: Manson Passey, MD  CHIEF COMPLAINT:   Chief Complaint  Patient presents with  . Shortness of Breath    HISTORY OF PRESENT ILLNESS:  Tinika Bucknam  is a 72 y.o. female who presents with shortness of breath which is been progressive over the last 3 days or so.  Patient starts that she began having some increased cough with clear sputum production around 3 days ago.  She denies any significant upper respiratory congestion, she denies any fevers or chills.  She does have a history of COPD, though she states she is not on regular inhalers at home, using only a rescue inhaler occasionally.  Today her shortness of breath became severe, and she came to the ED for evaluation.  Initial workup here in the ED is consistent with COPD.  She was initially placed on BiPAP for a brief period of time, but was able to wean off of this in the ED to 2 L nasal cannula supplemental oxygen.  Hospitalist were called for admission  PAST MEDICAL HISTORY:   Past Medical History:  Diagnosis Date  . Anxiety   . COPD (chronic obstructive pulmonary disease) (HCC)   . HLD (hyperlipidemia)   . Personal history of tobacco use, presenting hazards to health 12/06/2015     PAST SURGICAL HISTORY:   Past Surgical History:  Procedure Laterality Date  . CATARACT EXTRACTION    . right foot fusion    . TUBAL LIGATION       SOCIAL HISTORY:   Social History   Tobacco Use  . Smoking status: Current Every Day Smoker    Packs/day: 1.00    Years: 50.00    Pack years: 50.00  . Smokeless tobacco: Never Used  Substance Use Topics  . Alcohol use: No    Frequency: Never     FAMILY HISTORY:   Family History  Problem Relation Age of Onset  . Colon cancer Mother   .  Breast cancer Neg Hx      DRUG ALLERGIES:  No Known Allergies  MEDICATIONS AT HOME:   Prior to Admission medications   Medication Sig Start Date End Date Taking? Authorizing Provider  ALPRAZolam Prudy Feeler) 0.5 MG tablet Take 0.25 mg by mouth as needed. 04/08/17  Yes [provider]  FLUoxetine (PROZAC) 40 MG capsule TAKE ONE CAPSULE BY MOUTH DAILY 10/01/17  Yes [provider]  Multiple Vitamins-Minerals (ICAPS AREDS 2) CAPS Take 2 capsules by mouth daily.   Yes [provider]  simvastatin (ZOCOR) 40 MG tablet Take 1 tablet by mouth at bedtime. 08/28/16  Yes [provider]  albuterol (PROVENTIL HFA) 108 (90 Base) MCG/ACT inhaler Inhale 2 puffs into the lungs every 4 (four) hours. 04/08/17   [provider]    REVIEW OF SYSTEMS:  Review of Systems  Constitutional: Negative for chills, fever, malaise/fatigue and weight loss.  HENT: Negative for ear pain, hearing loss and tinnitus.   Eyes: Negative for blurred vision, double vision, pain and redness.  Respiratory: Positive for cough, sputum production (Clear), shortness of breath and wheezing. Negative for hemoptysis.   Cardiovascular: Negative for chest pain, palpitations, orthopnea and leg swelling.  Gastrointestinal: Negative for abdominal pain, constipation, diarrhea, nausea and vomiting.  Genitourinary: Negative for dysuria, frequency and  hematuria.  Musculoskeletal: Negative for back pain, joint pain and neck pain.  Skin:       No acne, rash, or lesions  Neurological: Negative for dizziness, tremors, focal weakness and weakness.  Endo/Heme/Allergies: Negative for polydipsia. Does not bruise/bleed easily.  Psychiatric/Behavioral: Negative for depression. The patient is not nervous/anxious and does not have insomnia.      VITAL SIGNS:   Vitals:   12/31/17 2330 01/01/18 0000 01/01/18 0030 01/01/18 0100  BP: (!) 145/81 (!) 152/83 (!) 150/81 (!) 145/80  Pulse: 92 91 86 89  Resp: (!) 26  (!) 29 (!) 25 (!) 34  Temp:      TempSrc:      SpO2: 100% 100% 100% 100%  Weight:      Height:       Wt Readings from Last 3 Encounters:  12/31/17 46.3 kg (102 lb)  12/16/17 47.6 kg (105 lb)  12/10/16 49.9 kg (110 lb)    PHYSICAL EXAMINATION:  Physical Exam  Vitals reviewed. Constitutional: She is oriented to person, place, and time. She appears well-developed and well-nourished. No distress.  HENT:  Head: Normocephalic and atraumatic.  Mouth/Throat: Oropharynx is clear and moist.  Eyes: Conjunctivae and EOM are normal. Pupils are equal, round, and reactive to light. No scleral icterus.  Neck: Normal range of motion. Neck supple. No JVD present. No thyromegaly present.  Cardiovascular: Normal rate, regular rhythm and intact distal pulses. Exam reveals no gallop and no friction rub.  No murmur heard. Respiratory: Effort normal. No respiratory distress. She has no wheezes. She has no rales.  Bilateral coarse breath sounds greater in upper lobes as compared to lower lobes  GI: Soft. Bowel sounds are normal. She exhibits no distension. There is no tenderness.  Musculoskeletal: Normal range of motion. She exhibits no edema.  No arthritis, no gout  Lymphadenopathy:    She has no cervical adenopathy.  Neurological: She is alert and oriented to person, place, and time. No cranial nerve deficit.  No dysarthria, no aphasia  Skin: Skin is warm and dry. No rash noted. No erythema.  Psychiatric: She has a normal mood and affect. Her behavior is normal. Judgment and thought content normal.    LABORATORY PANEL:   CBC Recent Labs  Lab 12/31/17 2324  WBC 12.1*  HGB 15.1  HCT 42.9  PLT 233   ------------------------------------------------------------------------------------------------------------------  Chemistries  Recent Labs  Lab 12/31/17 2324  NA 129*  K 3.4*  CL 92*  CO2 25  GLUCOSE 152*  BUN 8  CREATININE 0.55  CALCIUM 9.0    ------------------------------------------------------------------------------------------------------------------  Cardiac Enzymes Recent Labs  Lab 12/31/17 2324  TROPONINI <0.03   ------------------------------------------------------------------------------------------------------------------  RADIOLOGY:  Dg Chest Port 1 View  Result Date: 12/31/2017 CLINICAL DATA:  Shortness of breath.  Dyspnea. EXAM: PORTABLE CHEST 1 VIEW COMPARISON:  Chest CT 12/16/2017 FINDINGS: The lungs are hyperinflated. Emphysema on prior CT not well demonstrated radiographically. The cardiomediastinal contours are normal. Pulmonary vasculature is normal. No consolidation, pleural effusion, or pneumothorax. No acute osseous abnormalities are seen. Remote left lower rib fractures. IMPRESSION: Chronic hyperinflation.  No acute abnormality. Electronically Signed   By: Rubye Oaks M.D.   On: 12/31/2017 23:45    EKG:   Orders placed or performed during the hospital encounter of 12/31/17  . ED EKG  . ED EKG  . EKG 12-Lead  . EKG 12-Lead    IMPRESSION AND PLAN:  Principal Problem:   COPD with acute exacerbation (HCC) -patient was initially treated with  IV Solu-Medrol and several doses of duo nebs, and initially placed on BiPAP.  She has been able to wean off of BiPAP and has stable oxygen and work of breathing on supplemental oxygen, 2 L via nasal cannula.  We will admit and continue to treat with IV Solu-Medrol, azithromycin, duo nebs, and other supportive treatment PRN Active Problems:   Tobacco use -counseled smoking cessation.  Patient states that she is not been hospitalized with COPD issues prior to this, and is not on regular inhalers for her COPD.  Discussed that she will want to follow-up with primary care physician to determine if she needs to initiate any this medication or perhaps see a pulmonologist.   Anxiety -home dose anxiolytics   HLD (hyperlipidemia) -home dose statin  All the records  are reviewed and case discussed with ED provider. Management plans discussed with the patient and/or family.  DVT PROPHYLAXIS: SubQ lovenox  GI PROPHYLAXIS: None  ADMISSION STATUS: Observation  CODE STATUS: Full Code Status History    This patient does not have a recorded code status. Please follow your organizational policy for patients in this situation.      TOTAL TIME TAKING CARE OF THIS PATIENT: 40 minutes.   Amaal Dimartino FIELDING 01/01/2018, 1:48 AM  [facility]  Office  [phone]  CC: Primary care physician; Dorothey BasemanBronstein, Horrace Hanak, MD  Note:  This document was prepared using Dragon voice recognition software and may include unintentional dictation errors.

## 2018-01-01 NOTE — ED Notes (Signed)
Dr. Manson PasseyBrown requested that the Pt be removed from BIPAP as a trial. Pt was taken off of BIPAP.

## 2018-01-01 NOTE — Progress Notes (Signed)
T[place] over care of [place] Pt alert resting in room. Pt on room air at this time. IV infusing NS. No complaints from pt at this time.

## 2018-01-02 DIAGNOSIS — E440 Moderate protein-calorie malnutrition: Secondary | ICD-10-CM

## 2018-01-02 LAB — BASIC METABOLIC PANEL
ANION GAP: 9 (ref 5–15)
BUN: 9 mg/dL (ref 6–20)
CHLORIDE: 99 mmol/L — AB (ref 101–111)
CO2: 26 mmol/L (ref 22–32)
Calcium: 8.6 mg/dL — ABNORMAL LOW (ref 8.9–10.3)
Creatinine, Ser: 0.62 mg/dL (ref 0.44–1.00)
GFR calc Af Amer: >60 mL/min (ref >60)
GLUCOSE: 162 mg/dL — AB (ref 65–99)
POTASSIUM: 4 mmol/L (ref 3.5–5.1)
SODIUM: 134 mmol/L — AB (ref 135–145)

## 2018-01-02 LAB — CBC
HEMATOCRIT: 37.5 % (ref 35.0–47.0)
HEMOGLOBIN: 13.5 g/dL (ref 12.0–16.0)
MCH: 33.9 pg (ref 26.0–34.0)
MCHC: 35.9 g/dL (ref 32.0–36.0)
MCV: 94.3 fL (ref 80.0–100.0)
Platelets: 128 10*3/uL — ABNORMAL LOW (ref 150–440)
RBC: 3.98 MIL/uL (ref 3.80–5.20)
RDW: 13.4 % (ref 11.5–14.5)
WBC: 6.5 10*3/uL (ref 3.6–11.0)

## 2018-01-02 LAB — GLUCOSE, CAPILLARY
Glucose-Capillary: 154 mg/dL — ABNORMAL HIGH (ref 65–99)
Glucose-Capillary: 156 mg/dL — ABNORMAL HIGH (ref 65–99)
Glucose-Capillary: 159 mg/dL — ABNORMAL HIGH (ref 65–99)

## 2018-01-02 MED ORDER — GUAIFENESIN ER 600 MG PO TB12: 600.0000 mg | ORAL_TABLET | Freq: Two times a day (BID) | ORAL | 0 refills | Status: DC

## 2018-01-02 MED ORDER — IPRATROPIUM-ALBUTEROL 0.5-2.5 (3) MG/3ML IN SOLN: 3.0000 mL | RESPIRATORY_TRACT | 1 refills | Status: DC | PRN

## 2018-01-02 MED ORDER — MOMETASONE FURO-FORMOTEROL FUM 100-5 MCG/ACT IN AERO: 2.0000 | INHALATION_SPRAY | Freq: Two times a day (BID) | RESPIRATORY_TRACT | 0 refills | Status: DC

## 2018-01-02 MED ORDER — IPRATROPIUM-ALBUTEROL 20-100 MCG/ACT IN AERS: 1.0000 | INHALATION_SPRAY | Freq: Four times a day (QID) | RESPIRATORY_TRACT | Status: DC | PRN

## 2018-01-02 MED ORDER — METHYLPREDNISOLONE SODIUM SUCC 125 MG IJ SOLR
60.0000 mg | Freq: Once | INTRAMUSCULAR | Status: AC
  Administered 2018-01-02: 125 mg via INTRAVENOUS
  Filled 2018-01-02: qty 2

## 2018-01-02 MED ORDER — AZITHROMYCIN 500 MG PO TABS: 500.0000 mg | ORAL_TABLET | Freq: Every day | ORAL | 0 refills | Status: DC

## 2018-01-02 MED ORDER — ENSURE ENLIVE PO LIQD: 237.0000 mL | Freq: Two times a day (BID) | ORAL | 12 refills | Status: DC

## 2018-01-02 MED ORDER — PREDNISONE 20 MG PO TABS: ORAL_TABLET | ORAL | 0 refills | Status: DC

## 2018-01-02 MED ORDER — PREDNISONE 50 MG PO TABS: 50.0000 mg | ORAL_TABLET | Freq: Every day | ORAL | Status: DC

## 2018-01-02 MED ORDER — MOMETASONE FURO-FORMOTEROL FUM 100-5 MCG/ACT IN AERO
2.0000 | INHALATION_SPRAY | Freq: Two times a day (BID) | RESPIRATORY_TRACT | Status: DC
  Administered 2018-01-02: 2 via RESPIRATORY_TRACT
  Filled 2018-01-02: qty 8.8

## 2018-01-02 NOTE — Discharge Instructions (Signed)
Please take antibiotics until completely gone even if you feel better.

## 2018-01-02 NOTE — Discharge Summary (Signed)
[facility] [place] at [facility]   PATIENT NAME: Ashley Brooks    MR#:  [id number]  DATE OF BIRTH:  May 02, 1946  DATE OF ADMISSION:  12/31/2017 ADMITTING PHYSICIAN: Oralia Manisavid Willis, MD  DATE OF DISCHARGE: 01/02/2018  PRIMARY CARE PHYSICIAN: Dorothey BasemanBronstein, David, MD    ADMISSION DIAGNOSIS:  Shortness of breath  DISCHARGE DIAGNOSIS:  Acute on chronic COPD exacerbation Acute bronchitis Tobacco abuse  SECONDARY DIAGNOSIS:   Past Medical History:  Diagnosis Date  . Anxiety   . COPD (chronic obstructive pulmonary disease) (HCC)   . HLD (hyperlipidemia)   . Personal history of tobacco use, presenting hazards to health 12/06/2015    HOSPITAL COURSE:   72 year old female with past medical history significant for COPD, hypertension, smoking, anxiety presents to hospital secondary to worsening shortness of breath and congestion.  1. Acute on chronic COPD exacerbation with acute bronchitis-was initially tachypneic on presentation to the ED and hypoxic. Required BiPAP for a very brief period of time. -Currently on 2 L per nasal cannula--now weaned off to room air.  Sats 94-97% on room air. -Still has diffuse scattered wheezing. Continue Solu-Medrol today morning dose and then change to p.o. prednisone taper, duo nebs--nebulizer at home, Combivent and dulera added -On azithromycin for bronchitis  2. Tobacco use disorder-counseled strongly. - nicotine patch while in the hospital  3. Hyponatremia-dehydrated, gentle IV hydration for 1 day -Improved  4. Depression and anxiety-on Prozac  5. DVT prophylaxis-Lovenox  6.  Steroid-induced hyperglycemia  Patient ambulating well to the bathroom by herself. No indication for physical therapy consult Patient advised not to smoke.  She will follow with Dr. Terance HartBronstein as outpatient. Will discharge her later to home. CONSULTS OBTAINED:    DRUG ALLERGIES:  No Known Allergies  DISCHARGE MEDICATIONS:   Allergies  as of 01/02/2018   No Known Allergies     Medication List    STOP taking these medications   PROVENTIL HFA 108 (90 Base) MCG/ACT inhaler Generic drug:  albuterol     TAKE these medications   ALPRAZolam 0.5 MG tablet Commonly known as:  XANAX Take 0.25 mg by mouth as needed.   azithromycin 500 MG tablet Commonly known as:  ZITHROMAX Take 1 tablet (500 mg total) by mouth daily.   feeding supplement (ENSURE ENLIVE) Liqd Take 237 mLs by mouth 2 (two) times daily between meals.   FLUoxetine 40 MG capsule Commonly known as:  PROZAC TAKE ONE CAPSULE BY MOUTH DAILY   guaiFENesin 600 MG 12 hr tablet Commonly known as:  MUCINEX Take 1 tablet (600 mg total) by mouth 2 (two) times daily.   ICAPS AREDS 2 Caps Take 2 capsules by mouth daily.   ipratropium-albuterol 0.5-2.5 (3) MG/3ML Soln Commonly known as:  DUONEB Take 3 mLs by nebulization every 2 (two) hours as needed.   mometasone-formoterol 100-5 MCG/ACT Aero Commonly known as:  DULERA Inhale 2 puffs into the lungs 2 (two) times daily.   predniSONE 20 MG tablet Commonly known as:  DELTASONE Take 50 mg daily taper pwith 10 mg daily then stop Start taking on:  01/03/2018   simvastatin 40 MG tablet Commonly known as:  ZOCOR Take 1 tablet by mouth at bedtime.            Durable Medical Equipment  (From admission, onward)        Start     Ordered   01/02/18 0804  For home use only DME Nebulizer machine  Once    Question:  Patient  needs a nebulizer to treat with the following condition  Answer:  COPD (chronic obstructive pulmonary disease) (HCC)   01/02/18 0803      If you experience worsening of your admission symptoms, develop shortness of breath, life threatening emergency, suicidal or homicidal thoughts you must seek medical attention immediately by calling 911 or calling your MD immediately  if symptoms less severe.  You Must read complete instructions/literature along with all the possible adverse  reactions/side effects for all the Medicines you take and that have been prescribed to you. Take any new Medicines after you have completely understood and accept all the possible adverse reactions/side effects.   Please note  You were cared for by a hospitalist during your hospital stay. If you have any questions about your discharge medications or the care you received while you were in the hospital after you are discharged, you can call the unit and asked to speak with the hospitalist on call if the hospitalist that took care of you is not available. Once you are discharged, your primary care physician will handle any further medical issues. Please note that NO REFILLS for any discharge medications will be authorized once you are discharged, as it is imperative that you return to your primary care physician (or establish a relationship with a primary care physician if you do not have one) for your aftercare needs so that they can reassess your need for medications and monitor your lab values. Today   SUBJECTIVE   Cough and congestion  VITAL SIGNS:  Blood pressure (!) 146/70, pulse 97, temperature 98.5 F (36.9 C), temperature source Oral, resp. rate 18, height 5' (1.524 m), weight 42.8 kg (94 lb 4.8 oz), SpO2 96 %.  I/O:    Intake/Output Summary (Last 24 hours) at 01/02/2018 0804 Last data filed at 01/02/2018 0702 Gross per 24 hour  Intake 1548 ml  Output -  Net 1548 ml    PHYSICAL EXAMINATION:  GENERAL:  72 y.o.-year-old patient lying in the bed with no acute distress.  EYES: Pupils equal, round, reactive to light and accommodation. No scleral icterus. Extraocular muscles intact.  HEENT: Head atraumatic, normocephalic. Oropharynx and nasopharynx clear.  NECK:  Supple, no jugular venous distention. No thyroid enlargement, no tenderness.  LUNGS: Normal breath sounds bilaterally, scattered bilateral wheezing, no rales,rhonchi or crepitation. No use of accessory muscles of respiration.   CARDIOVASCULAR: S1, S2 normal. No murmurs, rubs, or gallops.  ABDOMEN: Soft, non-tender, non-distended. Bowel sounds present. No organomegaly or mass.  EXTREMITIES: No pedal edema, cyanosis, or clubbing.  NEUROLOGIC: Cranial nerves II through XII are intact. Muscle strength 5/5 in all extremities. Sensation intact. Gait not checked.  PSYCHIATRIC: The patient is alert and oriented x 3.  SKIN: No obvious rash, lesion, or ulcer.   DATA REVIEW:   CBC  Recent Labs  Lab 01/02/18 0414  WBC 6.5  HGB 13.5  HCT 37.5  PLT 128*    Chemistries  Recent Labs  Lab 01/02/18 0414  NA 134*  K 4.0  CL 99*  CO2 26  GLUCOSE 162*  BUN 9  CREATININE 0.62  CALCIUM 8.6*    Microbiology Results   No results found for this or any previous visit (from the past 240 hour(s)).  RADIOLOGY:  Dg Chest Port 1 View  Result Date: 12/31/2017 CLINICAL DATA:  Shortness of breath.  Dyspnea. EXAM: PORTABLE CHEST 1 VIEW COMPARISON:  Chest CT 12/16/2017 FINDINGS: The lungs are hyperinflated. Emphysema on prior CT not well demonstrated radiographically. The  cardiomediastinal contours are normal. Pulmonary vasculature is normal. No consolidation, pleural effusion, or pneumothorax. No acute osseous abnormalities are seen. Remote left lower rib fractures. IMPRESSION: Chronic hyperinflation.  No acute abnormality. Electronically Signed   By: Rubye Oaks M.D.   On: 12/31/2017 23:45     Management plans discussed with the patient, family and they are in agreement.  CODE STATUS:     Code Status Orders  (From admission, onward)        Start     Ordered   01/01/18 0300  Full code  Continuous     01/01/18 0259    Code Status History    Date Active Date Inactive Code Status Order ID Comments User Context   This patient has a current code status but no historical code status.      TOTAL TIME TAKING CARE OF THIS PATIENT: *40* minutes.    Enedina Finner M.D on 01/02/2018 at 8:04 AM  Between 7am to 6pm -  Pager - [phone] After 6pm go to [email or web address] - Social research officer, government  [facility]  Office  [phone]  CC: Primary care physician; Dorothey Baseman, MD

## 2018-01-02 NOTE — Progress Notes (Signed)
Discharge instructions and prescriptions reviewed with patient. Patient verbalized understanding. IV discontinued without difficulty. Patient escorted out by nursing via wheelchair to her significant other.

## 2018-01-02 NOTE — Care Management (Signed)
[place] [facility]

## 2018-01-06 ENCOUNTER — Telehealth: Payer: Self-pay

## 2018-01-06 NOTE — Telephone Encounter (Signed)
Flagged on EMMI report for not knowing who to contact about changes in condition. Attempted to reach patient on 01/06/18 at 10:32am, though had to leave voicemail.  Will attempt at later time.

## 2018-01-07 NOTE — Telephone Encounter (Signed)
2nd attempt made to reach patient done at 01/07/18 at 9:24am.  Left message encouraging patient to call back.

## 2018-01-09 ENCOUNTER — Telehealth: Payer: Self-pay | Admitting: Licensed Clinical Social Worker

## 2018-01-09 NOTE — Telephone Encounter (Signed)
EMMI flagged patient for a call back for answering yes to lost of interest in things and yes to feeling sad/hopeless/anxous/empty. CSW attempted to contact patient twice however she did not answer. CSW left a voicemail at 12:29 pm and 1:52 pm encouraging patient to call CSW back.   Baker Hughes IncorporatedBailey Sylvain Hasten, LCSW [phone]

## 2018-01-10 ENCOUNTER — Telehealth: Payer: Self-pay | Admitting: Licensed Clinical Social Worker

## 2018-01-10 ENCOUNTER — Telehealth: Payer: Self-pay | Admitting: Specialist

## 2018-01-10 NOTE — Telephone Encounter (Signed)
Patient did call Clinical Child psychotherapistocial Worker (CSW) back. Patient reported that she has a lot going on with her health and she is not feeling 100% yet. Patient reported that she has a PCP appointment next week that she plans to attend. Patient reported that she is not depressed and is not having thoughts of hurting herself. Patient reported that she is not interested in counseling resources at this time. Patient reported no other needs or concerns. No future call is needed.   [employer] Hughes IncorporatedBailey Pradyun Ishman, LCSW [phone]

## 2018-01-10 NOTE — Telephone Encounter (Signed)
EMMI flagged patient for a call back for answering yes to lost of interest in things and yes to feeling sad/hopeless/anxous/empty. CSW attempted to call patient for a 3rd time and she did not answer. CSW left a voicemail at 5:28 pm encouraging patient to call back. This is CSW's 3rd attempt. No future call will be placed.   [employer]orporatedBailey Loryn Haacke, LCSW [phone]

## 2018-08-25 ENCOUNTER — Encounter: Payer: Self-pay | Admitting: Emergency Medicine

## 2018-08-25 ENCOUNTER — Emergency Department: Payer: Medicare Other

## 2018-08-25 ENCOUNTER — Other Ambulatory Visit: Payer: Self-pay

## 2018-08-25 ENCOUNTER — Inpatient Hospital Stay
Admission: EM | Admit: 2018-08-25 | Discharge: 2018-08-29 | DRG: 190 | Disposition: A | Discharge status: Home / Self Care | Payer: Medicare Other | Attending: Internal Medicine | Admitting: Internal Medicine

## 2018-08-25 DIAGNOSIS — R000 Tachycardia, unspecified: Secondary | ICD-10-CM | POA: Diagnosis not present

## 2018-08-25 DIAGNOSIS — Z79899 Other long term (current) drug therapy: Secondary | ICD-10-CM | POA: Diagnosis not present

## 2018-08-25 DIAGNOSIS — F419 Anxiety disorder, unspecified: Secondary | ICD-10-CM | POA: Diagnosis present

## 2018-08-25 DIAGNOSIS — F329 Major depressive disorder, single episode, unspecified: Secondary | ICD-10-CM | POA: Diagnosis present

## 2018-08-25 DIAGNOSIS — R739 Hyperglycemia, unspecified: Secondary | ICD-10-CM | POA: Diagnosis present

## 2018-08-25 DIAGNOSIS — Z981 Arthrodesis status: Secondary | ICD-10-CM | POA: Diagnosis not present

## 2018-08-25 DIAGNOSIS — Z9849 Cataract extraction status, unspecified eye: Secondary | ICD-10-CM | POA: Diagnosis not present

## 2018-08-25 DIAGNOSIS — Z7952 Long term (current) use of systemic steroids: Secondary | ICD-10-CM | POA: Diagnosis not present

## 2018-08-25 DIAGNOSIS — J9601 Acute respiratory failure with hypoxia: Secondary | ICD-10-CM | POA: Diagnosis present

## 2018-08-25 DIAGNOSIS — T380X5A Adverse effect of glucocorticoids and synthetic analogues, initial encounter: Secondary | ICD-10-CM | POA: Diagnosis present

## 2018-08-25 DIAGNOSIS — E871 Hypo-osmolality and hyponatremia: Secondary | ICD-10-CM | POA: Diagnosis present

## 2018-08-25 DIAGNOSIS — J441 Chronic obstructive pulmonary disease with (acute) exacerbation: Principal | ICD-10-CM

## 2018-08-25 DIAGNOSIS — Z87891 Personal history of nicotine dependence: Secondary | ICD-10-CM

## 2018-08-25 DIAGNOSIS — Z23 Encounter for immunization: Secondary | ICD-10-CM | POA: Diagnosis present

## 2018-08-25 DIAGNOSIS — E785 Hyperlipidemia, unspecified: Secondary | ICD-10-CM | POA: Diagnosis present

## 2018-08-25 DIAGNOSIS — Z7951 Long term (current) use of inhaled steroids: Secondary | ICD-10-CM

## 2018-08-25 DIAGNOSIS — R0602 Shortness of breath: Secondary | ICD-10-CM

## 2018-08-25 HISTORY — DX: Chronic obstructive pulmonary disease with (acute) exacerbation: J44.1

## 2018-08-25 LAB — CBC
HEMATOCRIT: 40.5 % (ref 36.0–46.0)
HEMOGLOBIN: 14.1 g/dL (ref 12.0–15.0)
MCH: 33 pg (ref 26.0–34.0)
MCHC: 34.8 g/dL (ref 30.0–36.0)
MCV: 94.8 fL (ref 80.0–100.0)
NRBC: 0 % (ref 0.0–0.2)
Platelets: 165 10*3/uL (ref 150–400)
RBC: 4.27 MIL/uL (ref 3.87–5.11)
RDW: 13.2 % (ref 11.5–15.5)
WBC: 6.6 10*3/uL (ref 4.0–10.5)

## 2018-08-25 LAB — TROPONIN I

## 2018-08-25 LAB — BASIC METABOLIC PANEL
ANION GAP: 9 (ref 5–15)
BUN: 14 mg/dL (ref 8–23)
CHLORIDE: 99 mmol/L (ref 98–111)
CO2: 26 mmol/L (ref 22–32)
Calcium: 9 mg/dL (ref 8.9–10.3)
Creatinine, Ser: 0.68 mg/dL (ref 0.44–1.00)
Glucose, Bld: 118 mg/dL — ABNORMAL HIGH (ref 70–99)
POTASSIUM: 4.7 mmol/L (ref 3.5–5.1)
SODIUM: 134 mmol/L — AB (ref 135–145)

## 2018-08-25 MED ORDER — SODIUM CHLORIDE 0.9 % IV SOLN: 250.0000 mL | INTRAVENOUS | Status: DC | PRN

## 2018-08-25 MED ORDER — METHYLPREDNISOLONE SODIUM SUCC 125 MG IJ SOLR
125.0000 mg | Freq: Once | INTRAMUSCULAR | Status: AC
  Administered 2018-08-25: 125 mg via INTRAVENOUS
  Filled 2018-08-25: qty 2

## 2018-08-25 MED ORDER — ALBUTEROL SULFATE (2.5 MG/3ML) 0.083% IN NEBU
INHALATION_SOLUTION | RESPIRATORY_TRACT | Status: AC
  Filled 2018-08-25: qty 6

## 2018-08-25 MED ORDER — ALBUTEROL SULFATE (2.5 MG/3ML) 0.083% IN NEBU
10.0000 mg | INHALATION_SOLUTION | Freq: Once | RESPIRATORY_TRACT | Status: AC
  Administered 2018-08-25: 10 mg via RESPIRATORY_TRACT
  Filled 2018-08-25: qty 12

## 2018-08-25 MED ORDER — ALBUTEROL SULFATE (2.5 MG/3ML) 0.083% IN NEBU
5.0000 mg | INHALATION_SOLUTION | Freq: Once | RESPIRATORY_TRACT | Status: AC
  Administered 2018-08-25: 5 mg via RESPIRATORY_TRACT

## 2018-08-25 MED ORDER — ACETAMINOPHEN 650 MG RE SUPP: 650.0000 mg | Freq: Four times a day (QID) | RECTAL | Status: DC | PRN

## 2018-08-25 MED ORDER — ONDANSETRON HCL 4 MG PO TABS: 4.0000 mg | ORAL_TABLET | Freq: Four times a day (QID) | ORAL | Status: DC | PRN

## 2018-08-25 MED ORDER — ONDANSETRON HCL 4 MG/2ML IJ SOLN: 4.0000 mg | Freq: Four times a day (QID) | INTRAMUSCULAR | Status: DC | PRN

## 2018-08-25 MED ORDER — IPRATROPIUM-ALBUTEROL 0.5-2.5 (3) MG/3ML IN SOLN
3.0000 mL | Freq: Once | RESPIRATORY_TRACT | Status: AC
  Administered 2018-08-25: 3 mL via RESPIRATORY_TRACT
  Filled 2018-08-25: qty 3

## 2018-08-25 MED ORDER — ACETAMINOPHEN 325 MG PO TABS
650.0000 mg | ORAL_TABLET | Freq: Four times a day (QID) | ORAL | Status: DC | PRN
  Administered 2018-08-26: 12:00:00 650 mg via ORAL
  Filled 2018-08-25: qty 2

## 2018-08-25 MED ORDER — SODIUM CHLORIDE 0.9% FLUSH: 3.0000 mL | INTRAVENOUS | Status: DC | PRN

## 2018-08-25 MED ORDER — POLYETHYLENE GLYCOL 3350 17 G PO PACK: 17.0000 g | PACK | Freq: Every day | ORAL | Status: DC | PRN

## 2018-08-25 MED ORDER — IPRATROPIUM-ALBUTEROL 0.5-2.5 (3) MG/3ML IN SOLN
3.0000 mL | Freq: Once | RESPIRATORY_TRACT | Status: AC
  Administered 2018-08-25: 3 mL via RESPIRATORY_TRACT
  Filled 2018-08-25: qty 9

## 2018-08-25 MED ORDER — PNEUMOCOCCAL VAC POLYVALENT 25 MCG/0.5ML IJ INJ
0.5000 mL | INJECTION | INTRAMUSCULAR | Status: AC
  Administered 2018-08-26: 11:00:00 0.5 mL via INTRAMUSCULAR
  Filled 2018-08-25: qty 0.5

## 2018-08-25 MED ORDER — IPRATROPIUM-ALBUTEROL 0.5-2.5 (3) MG/3ML IN SOLN
3.0000 mL | RESPIRATORY_TRACT | Status: DC
  Administered 2018-08-25 – 2018-08-28 (×18): 3 mL via RESPIRATORY_TRACT
  Filled 2018-08-25 (×19): qty 3

## 2018-08-25 MED ORDER — ALBUTEROL SULFATE (2.5 MG/3ML) 0.083% IN NEBU: 2.5000 mg | INHALATION_SOLUTION | RESPIRATORY_TRACT | Status: DC | PRN

## 2018-08-25 MED ORDER — INFLUENZA VAC SPLIT HIGH-DOSE 0.5 ML IM SUSY
0.5000 mL | PREFILLED_SYRINGE | INTRAMUSCULAR | Status: AC
  Administered 2018-08-26: 12:00:00 0.5 mL via INTRAMUSCULAR
  Filled 2018-08-25 (×2): qty 0.5

## 2018-08-25 MED ORDER — SODIUM CHLORIDE 0.9% FLUSH
3.0000 mL | Freq: Two times a day (BID) | INTRAVENOUS | Status: DC
  Administered 2018-08-26 – 2018-08-29 (×8): 3 mL via INTRAVENOUS

## 2018-08-25 MED ORDER — ENOXAPARIN SODIUM 40 MG/0.4ML ~~LOC~~ SOLN
40.0000 mg | SUBCUTANEOUS | Status: DC
  Administered 2018-08-26 – 2018-08-28 (×4): 40 mg via SUBCUTANEOUS
  Filled 2018-08-25 (×5): qty 0.4

## 2018-08-25 MED ORDER — ORAL CARE MOUTH RINSE
15.0000 mL | Freq: Two times a day (BID) | OROMUCOSAL | Status: DC
  Administered 2018-08-26 – 2018-08-29 (×8): 15 mL via OROMUCOSAL

## 2018-08-25 MED ORDER — IPRATROPIUM-ALBUTEROL 0.5-2.5 (3) MG/3ML IN SOLN
3.0000 mL | Freq: Once | RESPIRATORY_TRACT | Status: AC
  Administered 2018-08-25: 3 mL via RESPIRATORY_TRACT

## 2018-08-25 MED ORDER — METHYLPREDNISOLONE SODIUM SUCC 125 MG IJ SOLR
60.0000 mg | Freq: Two times a day (BID) | INTRAMUSCULAR | Status: DC
  Administered 2018-08-26 (×2): 60 mg via INTRAVENOUS
  Filled 2018-08-25 (×4): qty 2

## 2018-08-25 NOTE — ED Notes (Signed)
Report called to [facility], pt admitted to 118 via stretcher.

## 2018-08-25 NOTE — ED Notes (Signed)
Pt in with co shob x 2 days states hx of copd. Has had productive cough yellow sputum. Denies any fever or recent illness.

## 2018-08-25 NOTE — ED Triage Notes (Signed)
Pt with respiratory distress since this AM. Pt having a hard time making sentences. Pt placed on oxygen due to O2 in 80s. Pt has hx/o COPD. Pt has used breathing treatments at home without relief. Auditory wheezing in all fields, diaphragmatic breathing.

## 2018-08-25 NOTE — ED Notes (Signed)
Pt >90% on RA 

## 2018-08-25 NOTE — ED Provider Notes (Signed)
[facility] Emergency Department Provider Note  ____________________________________________  Time seen: Approximately 7:44 PM  I have reviewed the triage vital signs and the nursing notes.   HISTORY  Chief Complaint Respiratory Distress   HPI Ashley Brooks is a 72 y.o. female with a history of COPD and hyperlipidemia who presents for evaluation of shortness of breath.  Patient reports 2 to 3 days of progressively worsening shortness of breath.  Initially her inhalers were helping however today there is no longer helping.  She has been wheezing.  No changes in her chronic cough, no chest pain, no fever or chills, no nausea or vomiting.  No personal or family history of blood clots, no recent travel immobilization, no leg pain or swelling, no hemoptysis.  Patient is complaining of constant moderate to severe shortness of breath at this time.  Past Medical History:  Diagnosis Date  . Anxiety   . COPD (chronic obstructive pulmonary disease) (HCC)   . HLD (hyperlipidemia)   . Personal history of tobacco use, presenting hazards to health 12/06/2015    Patient Active Problem List   Diagnosis Date Noted  . Malnutrition of moderate degree 01/02/2018  . COPD with acute exacerbation (HCC) 01/01/2018  . Anxiety 01/01/2018  . HLD (hyperlipidemia) 01/01/2018  . Tobacco use 12/06/2015    Past Surgical History:  Procedure Laterality Date  . CATARACT EXTRACTION    . right foot fusion    . TUBAL LIGATION      Prior to Admission medications   Medication Sig Start Date End Date Taking? Authorizing Provider  ALPRAZolam Prudy Feeler) 0.5 MG tablet Take 0.25 mg by mouth as needed. 04/08/17   [provider]  azithromycin (ZITHROMAX) 500 MG tablet Take 1 tablet (500 mg total) by mouth daily. Patient not taking: Reported on 08/25/2018 01/02/18   Enedina Finner, MD  feeding supplement, ENSURE ENLIVE, (ENSURE ENLIVE) LIQD Take 237 mLs by mouth 2 (two) times daily between  meals. 01/02/18   Enedina Finner, MD  FLUoxetine (PROZAC) 40 MG capsule TAKE ONE CAPSULE BY MOUTH DAILY 10/01/17   [provider]  guaiFENesin (MUCINEX) 600 MG 12 hr tablet Take 1 tablet (600 mg total) by mouth 2 (two) times daily. 01/02/18   Enedina Finner, MD  ipratropium-albuterol (DUONEB) 0.5-2.5 (3) MG/3ML SOLN Take 3 mLs by nebulization every 2 (two) hours as needed. 01/02/18   Enedina Finner, MD  mometasone-formoterol [facility]) 100-5 MCG/ACT AERO Inhale 2 puffs into the lungs 2 (two) times daily. 01/02/18   Enedina Finner, MD  Multiple Vitamins-Minerals (ICAPS AREDS 2) CAPS Take 2 capsules by mouth daily.    [provider]  predniSONE (DELTASONE) 20 MG tablet Take 50 mg daily taper pwith 10 mg daily then stop 01/03/18   Enedina Finner, MD  simvastatin (ZOCOR) 40 MG tablet Take 1 tablet by mouth at bedtime. 08/28/16   [provider]    Allergies Patient has no known allergies.  Family History  Problem Relation Age of Onset  . Colon cancer Mother   . Breast cancer Neg Hx     Social History Social History   Tobacco Use  . Smoking status: Current Every Day Smoker    Packs/day: 1.00    Years: 50.00    Pack years: 50.00  . Smokeless tobacco: Never Used  Substance Use Topics  . Alcohol use: No    Frequency: Never  . Drug use: No    Review of Systems  Constitutional: Negative for fever. Eyes: Negative for  visual changes. ENT: Negative for sore throat. Neck: No neck pain  Cardiovascular: Negative for chest pain. Respiratory: + shortness of breath. Gastrointestinal: Negative for abdominal pain, vomiting or diarrhea. Genitourinary: Negative for dysuria. Musculoskeletal: Negative for back pain. Skin: Negative for rash. Neurological: Negative for headaches, weakness or numbness. Psych: No SI or HI  ____________________________________________   PHYSICAL EXAM:  VITAL SIGNS: ED Triage Vitals  Enc Vitals Group     BP 08/25/18 1443 (!) 155/80     Pulse Rate 08/25/18  1443 94     Resp 08/25/18 1443 (!) 32     Temp 08/25/18 1443 98 F (36.7 C)     Temp Source 08/25/18 1443 Oral     SpO2 08/25/18 1443 (!) 86 %     Weight --      Height --      Head Circumference --      Peak Flow --      Pain Score 08/25/18 1913 0     Pain Loc --      Pain Edu? --      Excl. in GC? --     Constitutional: Alert and oriented. Well appearing and in no apparent distress. HEENT:      Head: Normocephalic and atraumatic.         Eyes: Conjunctivae are normal. Sclera is non-icteric.       Mouth/Throat: Mucous membranes are moist.       Neck: Supple with no signs of meningismus. Cardiovascular: Regular rate and rhythm. No murmurs, gallops, or rubs. 2+ symmetrical distal pulses are present in all extremities. No JVD. Respiratory: Moderate increased work of breathing, tachypneic, hypoxic on room air, severely diminished air movement bilaterally with expiratory wheezes  Gastrointestinal: Soft, non tender, and non distended with positive bowel sounds. No rebound or guarding. Musculoskeletal: Nontender with normal range of motion in all extremities. No edema, cyanosis, or erythema of extremities. Neurologic: Normal speech and language. Face is symmetric. Moving all extremities. No gross focal neurologic deficits are appreciated. Skin: Skin is warm, dry and intact. No rash noted. Psychiatric: Mood and affect are normal. Speech and behavior are normal.  ____________________________________________   LABS (all labs ordered are listed, but only abnormal results are displayed)  Labs Reviewed  BASIC METABOLIC PANEL - Abnormal; Notable for the following components:      Result Value   Sodium 134 (*)    Glucose, Bld 118 (*)    All other components within normal limits  CBC  TROPONIN I  TROPONIN I   ____________________________________________  EKG  ED ECG REPORT I, Nita Sickle, the attending physician, personally viewed and interpreted this ECG.  Normal sinus  rhythm, rate of 96, normal intervals, normal axis, Q waves on anterior leads, no ST elevations or depressions.  No significant changes when compared to prior. ____________________________________________  RADIOLOGY  I have personally reviewed the images performed during this visit and I agree with the Radiologist's read.   Interpretation by Radiologist:  Dg Chest 2 View  Result Date: 08/25/2018 CLINICAL DATA:  Respiratory distress since this morning. EXAM: CHEST - 2 VIEW COMPARISON:  12/31/2017 FINDINGS: Emphysematous hyperinflation of the lungs. Normal heart size. Mild aortic atherosclerosis without aneurysmal dilatation. No acute osseous abnormality. IMPRESSION: Similar findings of the chest with hyperinflated lungs and aortic atherosclerosis. Electronically Signed   By: Tollie Eth M.D.   On: 08/25/2018 15:14     ____________________________________________   PROCEDURES  Procedure(s) performed: None Procedures Critical Care performed: yes  CRITICAL  CARE Performed by: Nita Sickle  ?  Total critical care time: 35 min  Critical care time was exclusive of separately billable procedures and treating other patients.  Critical care was necessary to treat or prevent imminent or life-threatening deterioration.  Critical care was time spent personally by me on the following activities: development of treatment plan with patient and/or surrogate as well as nursing, discussions with consultants, evaluation of patient's response to treatment, examination of patient, obtaining history from patient or surrogate, ordering and performing treatments and interventions, ordering and review of laboratory studies, ordering and review of radiographic studies, pulse oximetry and re-evaluation of patient's condition.  ____________________________________________   INITIAL IMPRESSION / ASSESSMENT AND PLAN / ED COURSE   72 y.o. female with a history of COPD and hyperlipidemia who presents for  evaluation of shortness of breath.  Patient with increased work of breathing, hypoxic, severely diminished air movement and expiratory wheezes consistent with a COPD exacerbation.  Patient has no risk factors for PE.  She has had no fever, no chills, normal white count with no evidence of pneumonia or flu.  Patient received 3 DuoNeb's prior to my evaluation and continues to have significantly increased work of breathing and hypoxia therefore patient will be admitted for acute hypoxic respiratory failure.  She received Solu-Medrol.  EKG and troponin showing no ischemic changes.  Discussed with hospitalist for admission.      As part of my medical decision making, I reviewed the following data within the electronic MEDICAL RECORD NUMBER Nursing notes reviewed and incorporated, Labs reviewed , EKG interpreted , Old EKG reviewed, Old chart reviewed, Radiograph reviewed , Discussed with admitting physician , Notes from prior ED visits and [place] Controlled Substance Database    Pertinent labs & imaging results that were available during my care of the patient were reviewed by me and considered in my medical decision making (see chart for details).    ____________________________________________   FINAL CLINICAL IMPRESSION(S) / ED DIAGNOSES  Final diagnoses:  Acute respiratory failure with hypoxia (HCC)  COPD exacerbation (HCC)      NEW MEDICATIONS STARTED DURING THIS VISIT:  ED Discharge Orders    None       Note:  This document was prepared using Dragon voice recognition software and may include unintentional dictation errors.    Nita Sickle, MD 08/25/18 [phone]

## 2018-08-25 NOTE — H&P (Signed)
[facility] [place] at [facility]   PATIENT NAME: Ashley Brooks    MR#:  [id number]  DATE OF BIRTH:  10-29-1946  DATE OF ADMISSION:  08/25/2018  PRIMARY CARE PHYSICIAN: Dorothey Baseman, MD   REQUESTING/REFERRING PHYSICIAN: Dr. Don Perking  CHIEF COMPLAINT:   Chief Complaint  Patient presents with  . Respiratory Distress    HISTORY OF PRESENT ILLNESS:  Ashley Brooks  is a 71 y.o. female with a known history of COPD, past tobacco use presents to the hospital complaining of worsening shortness of breath over 3 days.  Patient had COPD exacerbation and needed BiPAP in February 2019 when she was admitted to the hospital.  Since then she has had frequent flareups but over the past 3 days has been severe and she decided to come to the emergency room.  Here patient was found to be tachypneic, hypoxic and placed on 6 L oxygen.  Received multiple doses of albuterol and DuoNeb with only minimal improvement.  Also received IV Solu-Medrol.  No orthopnea no lower extremity edema.  Quit smoking 7 months back.  Patient needs admission to the hospital for further treatment.  Presently she is on 6 L oxygen.  PAST MEDICAL HISTORY:   Past Medical History:  Diagnosis Date  . Anxiety   . COPD (chronic obstructive pulmonary disease) (HCC)   . HLD (hyperlipidemia)   . Personal history of tobacco use, presenting hazards to health 12/06/2015    PAST SURGICAL HISTORY:   Past Surgical History:  Procedure Laterality Date  . CATARACT EXTRACTION    . right foot fusion    . TUBAL LIGATION      SOCIAL HISTORY:   Social History   Tobacco Use  . Smoking status: Current Every Day Smoker    Packs/day: 1.00    Years: 50.00    Pack years: 50.00  . Smokeless tobacco: Never Used  Substance Use Topics  . Alcohol use: No    Frequency: Never    FAMILY HISTORY:   Family History  Problem Relation Age of Onset  . Colon cancer Mother   . Breast cancer Neg Hx     DRUG ALLERGIES:  No  Known Allergies  REVIEW OF SYSTEMS:   Review of Systems  Constitutional: Positive for malaise/fatigue. Negative for chills and fever.  HENT: Negative for sore throat.   Eyes: Negative for blurred vision, double vision and pain.  Respiratory: Negative for cough, hemoptysis, shortness of breath and wheezing.   Cardiovascular: Negative for chest pain, palpitations, orthopnea and leg swelling.  Gastrointestinal: Negative for abdominal pain, constipation, diarrhea, heartburn, nausea and vomiting.  Genitourinary: Negative for dysuria and hematuria.  Musculoskeletal: Negative for back pain and joint pain.  Skin: Negative for rash.  Neurological: Negative for sensory change, speech change, focal weakness and headaches.  Endo/Heme/Allergies: Does not bruise/bleed easily.  Psychiatric/Behavioral: Negative for depression. The patient is not nervous/anxious.     MEDICATIONS AT HOME:   Prior to Admission medications   Medication Sig Start Date End Date Taking? Authorizing Provider  ALPRAZolam Prudy Feeler) 0.5 MG tablet Take 0.25 mg by mouth at bedtime as needed for anxiety or sleep.    Yes [provider]  ANORO ELLIPTA 62.5-25 MCG/INH AEPB Inhale 1 puff into the lungs daily.  07/11/18  Yes [provider]  aspirin 325 MG tablet Take 325 mg by mouth daily as needed for headache.   Yes [provider]  diphenhydrAMINE (BENADRYL) 25 mg capsule Take 25 mg by mouth every 6 (  six) hours as needed for itching or allergies.   Yes [provider]  feeding supplement, ENSURE ENLIVE, (ENSURE ENLIVE) LIQD Take 237 mLs by mouth 2 (two) times daily between meals. 01/02/18  Yes Enedina Finner, MD  FLUoxetine (PROZAC) 40 MG capsule Take 40 mg by mouth daily.    Yes [provider]  ipratropium-albuterol (DUONEB) 0.5-2.5 (3) MG/3ML SOLN Take 3 mLs by nebulization every 2 (two) hours as needed. 01/02/18  Yes Enedina Finner, MD  simvastatin (ZOCOR) 40 MG tablet Take 1 tablet by mouth at  bedtime. 08/28/16  Yes [provider]  VENTOLIN HFA 108 (90 Base) MCG/ACT inhaler Inhale 2 puffs into the lungs every 4 (four) hours as needed for wheezing. 06/03/18  Yes [provider]  azithromycin (ZITHROMAX) 500 MG tablet Take 1 tablet (500 mg total) by mouth daily. Patient not taking: Reported on 08/25/2018 01/02/18   Enedina Finner, MD  guaiFENesin (MUCINEX) 600 MG 12 hr tablet Take 1 tablet (600 mg total) by mouth 2 (two) times daily. Patient not taking: Reported on 08/25/2018 01/02/18   Enedina Finner, MD  mometasone-formoterol [facility]) 100-5 MCG/ACT AERO Inhale 2 puffs into the lungs 2 (two) times daily. Patient not taking: Reported on 08/25/2018 01/02/18   Enedina Finner, MD  predniSONE (DELTASONE) 20 MG tablet Take 50 mg daily taper pwith 10 mg daily then stop Patient not taking: Reported on 08/25/2018 01/03/18   Enedina Finner, MD     VITAL SIGNS:  Blood pressure 139/67, pulse (!) 104, temperature 98 F (36.7 C), temperature source Oral, resp. rate (!) 30, SpO2 92 %.  PHYSICAL EXAMINATION:  Physical Exam  GENERAL:  72 y.o.-year-old patient lying in the bed with respiratory distress EYES: Pupils equal, round, reactive to light and accommodation. No scleral icterus. Extraocular muscles intact.  HEENT: Head atraumatic, normocephalic. Oropharynx and nasopharynx clear. No oropharyngeal erythema, moist oral mucosa  NECK:  Supple, no jugular venous distention. No thyroid enlargement, no tenderness.  LUNGS: Decreased breath sounds bilaterally with wheezing.  Increased work of breathing CARDIOVASCULAR: S1, S2 normal. No murmurs, rubs, or gallops.  ABDOMEN: Soft, nontender, nondistended. Bowel sounds present. No organomegaly or mass.  EXTREMITIES: No pedal edema, cyanosis, or clubbing. + 2 pedal & radial pulses b/l.   NEUROLOGIC: Cranial nerves II through XII are intact. No focal Motor or sensory deficits appreciated b/l PSYCHIATRIC: The patient is alert and oriented x 3. Good affect.   SKIN: No obvious rash, lesion, or ulcer.   LABORATORY PANEL:   CBC Recent Labs  Lab 08/25/18 1445  WBC 6.6  HGB 14.1  HCT 40.5  PLT 165   ------------------------------------------------------------------------------------------------------------------  Chemistries  Recent Labs  Lab 08/25/18 1445  NA 134*  K 4.7  CL 99  CO2 26  GLUCOSE 118*  BUN 14  CREATININE 0.68  CALCIUM 9.0   ------------------------------------------------------------------------------------------------------------------  Cardiac Enzymes Recent Labs  Lab 08/25/18 1949  TROPONINI <0.03   ------------------------------------------------------------------------------------------------------------------  RADIOLOGY:  Dg Chest 2 View  Result Date: 08/25/2018 CLINICAL DATA:  Respiratory distress since this morning. EXAM: CHEST - 2 VIEW COMPARISON:  12/31/2017 FINDINGS: Emphysematous hyperinflation of the lungs. Normal heart size. Mild aortic atherosclerosis without aneurysmal dilatation. No acute osseous abnormality. IMPRESSION: Similar findings of the chest with hyperinflated lungs and aortic atherosclerosis. Electronically Signed   By: Tollie Eth M.D.   On: 08/25/2018 15:14   IMPRESSION AND PLAN:   *Acute COPD exacerbation with acute hypoxic respiratory failure -IV steroids - Scheduled Nebulizers - Inhalers -Wean O2 as tolerated -  Consult pulmonary if no improvement  *Sinus tachycardia secondary to multiple nebulizer treatments.  Monitor.  *Mild hyponatremia, asymptomatic.  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  Repeat labs in the morning.  *DVT prophylaxis with Lovenox   All the records are reviewed and case discussed with ED provider. Management plans discussed with the patient, family and they are in agreement.  CODE STATUS: Partial code.  No intubation.  Okay for CPR and defibrillation  TOTAL TIME TAKING CARE OF THIS PATIENT: 40 minutes.   Molinda Bailiff Zebulin Siegel M.D on 08/25/2018 at  8:53 PM  Between 7am to 6pm - Pager - [phone]  After 6pm go to [email or web address] - password [email or web address]  [facility]  Office  [phone]  CC: Primary care physician; Dorothey Baseman, MD  Note: This dictation was prepared with Dragon dictation along with smaller phrase technology. Any transcriptional errors that result from this process are unintentional.

## 2018-08-26 LAB — CBC
HEMATOCRIT: 39.5 % (ref 36.0–46.0)
HEMOGLOBIN: 13.5 g/dL (ref 12.0–15.0)
MCH: 32.6 pg (ref 26.0–34.0)
MCHC: 34.2 g/dL (ref 30.0–36.0)
MCV: 95.4 fL (ref 80.0–100.0)
Platelets: 169 10*3/uL (ref 150–400)
RBC: 4.14 MIL/uL (ref 3.87–5.11)
RDW: 12.8 % (ref 11.5–15.5)
WBC: 3.5 10*3/uL — AB (ref 4.0–10.5)
nRBC: 0 % (ref 0.0–0.2)

## 2018-08-26 LAB — BASIC METABOLIC PANEL
ANION GAP: 9 (ref 5–15)
BUN: 15 mg/dL (ref 8–23)
CHLORIDE: 99 mmol/L (ref 98–111)
CO2: 28 mmol/L (ref 22–32)
Calcium: 8.7 mg/dL — ABNORMAL LOW (ref 8.9–10.3)
Creatinine, Ser: 0.61 mg/dL (ref 0.44–1.00)
GFR calc Af Amer: >60 mL/min (ref >60)
GFR calc non Af Amer: >60 mL/min (ref >60)
Glucose, Bld: 198 mg/dL — ABNORMAL HIGH (ref 70–99)
POTASSIUM: 4.2 mmol/L (ref 3.5–5.1)
Sodium: 136 mmol/L (ref 135–145)

## 2018-08-26 MED ORDER — ADULT MULTIVITAMIN W/MINERALS CH
1.0000 | ORAL_TABLET | Freq: Every day | ORAL | Status: DC
  Administered 2018-08-27 – 2018-08-29 (×3): 1 via ORAL
  Filled 2018-08-26 (×3): qty 1

## 2018-08-26 MED ORDER — ENSURE ENLIVE PO LIQD
237.0000 mL | Freq: Two times a day (BID) | ORAL | Status: DC
  Administered 2018-08-27 – 2018-08-29 (×4): 237 mL via ORAL

## 2018-08-26 NOTE — Progress Notes (Signed)
[facility] - [place] at [facility]   PATIENT NAME: Ashley Brooks    MR#:  [id number]  DATE OF BIRTH:  04/29/1946  SUBJECTIVE: admitted for COPD exacerbation.  CHIEF COMPLAINT:   Chief Complaint  Patient presents with  . Respiratory Distress  Feels dyspneic on exertion .  REVIEW OF SYSTEMS:   ROS CONSTITUTIONAL: No fever, fatigue or weakness.  EYES: No blurred or double vision.  EARS, NOSE, AND THROAT: No tinnitus or ear pain.  RESPIRATORY:CARDIOVASCULAR: No chest pain, orthopnea, edema.  GASTROINTESTINAL: No nausea, vomiting, diarrhea or abdominal pain.  GENITOURINARY: No dysuria, hematuria.  ENDOCRINE: No polyuria, nocturia,  HEMATOLOGY: No anemia, easy bruising or bleeding SKIN: No rash or lesion. MUSCULOSKELETAL: No joint pain or arthritis.   NEUROLOGIC: No tingling, numbness, weakness.  PSYCHIATRY: No anxiety or depression.   DRUG ALLERGIES:  No Known Allergies  VITALS:  Blood pressure 118/66, pulse (!) 103, temperature 97.6 F (36.4 C), temperature source Oral, resp. rate 20, weight 45 kg, SpO2 95 %.  PHYSICAL EXAMINATION:  GENERAL:  72 y.o.-year-old patient lying in the bed with no acute distress.  EYES: Pupils equal, round, reactive to light and accommodation. No scleral icterus. Extraocular muscles intact.  HEENT: Head atraumatic, normocephalic. Oropharynx and nasopharynx clear.  NECK:  Supple, no jugular venous distention. No thyroid enlargement, no tenderness.  LUNGS: Clear to auscultation, no wheezing.  CARDIOVASCULAR: S1, S2 normal. No murmurs, rubs, or gallops.  ABDOMEN: Soft, nontender, nondistended. Bowel sounds present. No organomegaly or mass.  EXTREMITIES: No pedal edema, cyanosis, or clubbing.  NEUROLOGIC: Cranial nerves II through XII are intact. Muscle strength 5/5 in all extremities. Sensation intact. Gait not checked.  PSYCHIATRIC: The patient is alert and oriented x 3.  SKIN: No obvious rash, lesion, or ulcer.     LABORATORY PANEL:   CBC Recent Labs  Lab 08/26/18 0602  WBC 3.5*  HGB 13.5  HCT 39.5  PLT 169   ------------------------------------------------------------------------------------------------------------------  Chemistries  Recent Labs  Lab 08/26/18 0602  NA 136  K 4.2  CL 99  CO2 28  GLUCOSE 198*  BUN 15  CREATININE 0.61  CALCIUM 8.7*   ------------------------------------------------------------------------------------------------------------------  Cardiac Enzymes Recent Labs  Lab 08/25/18 1949  TROPONINI <0.03   ------------------------------------------------------------------------------------------------------------------  RADIOLOGY:  Dg Chest 2 View  Result Date: 08/25/2018 CLINICAL DATA:  Respiratory distress since this morning. EXAM: CHEST - 2 VIEW COMPARISON:  12/31/2017 FINDINGS: Emphysematous hyperinflation of the lungs. Normal heart size. Mild aortic atherosclerosis without aneurysmal dilatation. No acute osseous abnormality. IMPRESSION: Similar findings of the chest with hyperinflated lungs and aortic atherosclerosis. Electronically Signed   By: Tollie Eth M.D.   On: 08/25/2018 15:14    EKG:   Orders placed or performed during the hospital encounter of 08/25/18  . EKG 12-Lead  . EKG 12-Lead  . ED EKG within 10 minutes  . ED EKG within 10 minutes    ASSESSMENT AND PLAN:   Respiratory distress secondary to COPD exacerbation: Precipitated by the paint that she used a new apartment.  Continue bronchodilators, IV steroids, clinically lung sounds better but she feels still short of breath on talking so continue bronchodilators.   #2 sinus tachycardia secondary to #1: Monitor 3.  Mild hyponatremia improved 4.  Patient told me that she has no family and she is trying to move to different apartment from [place] to [place] and this new apartment she is inhaling all the fumes from the patient and she thinks that is caused her shortness  of  breath.  Advised her to put a mask when she is involved in anything the paints.  All the records are reviewed and case discussed with Care Management/Social Workerr. Management plans discussed with the patient, family and they are in agreement.  CODE STATUS: full  TOTAL TIME TAKING CARE OF THIS PATIENT: 35 minutes.   POSSIBLE D/C IN 1-2 DAYS, DEPENDING ON CLINICAL CONDITION.  more than 50% time spent in counseling, coordination of care  Katha Hamming M.D on 08/26/2018 at 1:58 PM  Between 7am to 6pm - Pager - [phone]  After 6pm go to [email or web address] - password [email or web address]  [facility]  Office  [phone]  CC: Primary care physician; Dorothey Baseman, MD   Note: This dictation was prepared with Dragon dictation along with smaller phrase technology. Any transcriptional errors that result from this process are unintentional.

## 2018-08-26 NOTE — Progress Notes (Signed)
   08/26/18 1350  Clinical Encounter Type  Visited With Patient and family together  Visit Type Follow-up;Social support  Referral From Nurse  Consult/Referral To Chaplain  Spiritual Encounters  Spiritual Needs Brochure;Other (Comment)   CH followed up on a OR from this morning. I educated the patient on the AD process and will follow up if needed.

## 2018-08-26 NOTE — Progress Notes (Signed)
   08/26/18 0930  Clinical Encounter Type  Visited With Patient not available  Visit Type Initial;Spiritual support  Referral From Nurse  Consult/Referral To Chaplain  Spiritual Encounters  Spiritual Needs Brochure;Emotional   CH received a OR to educate the patient on the AD process. When entering the room I discovered the patient was sleeping very well and did not want to disturb her. I will follow up later in the day.

## 2018-08-26 NOTE — Progress Notes (Signed)
Initial Nutrition Assessment  DOCUMENTATION CODES:   Not applicable  INTERVENTION:  Provide Ensure Enlive po BID, each supplement provides 350 kcal and 20 grams of protein. Patient prefers chocolate.  Encouraged adequate intake of calories and protein at meals.  Provide daily MVI.  NUTRITION DIAGNOSIS:   Increased nutrient needs related to catabolic illness(COPD) as evidenced by estimated needs.  GOAL:   Patient will meet greater than or equal to 90% of their needs  MONITOR:   PO intake, Supplement acceptance, Labs, Weight trends, I & O's  REASON FOR ASSESSMENT:   Malnutrition Screening Tool    ASSESSMENT:   72 year old female with PMHx of tobacco use, COPD, anxiety, HLD admitted with acute exacerbation of COPD.   Met with patient at bedside. She is known to this RD from an admission in 12/2017. Patient reports her appetite is much better than in was in March but it is still not back to baseline. She is now eating about 2 meals per day. Today for breakfast she had scrambled eggs, potatoes, coffee, and orange juice. For lunch she had grilled cheese and tomato soup. She was drinking Ensure regularly at home for a while but now has not been able to drink Ensure lately because she is on a second floor hotel with no elevator and cannot carry them up the stairs. She would like to drink Ensure here and prefers chocolate flavor.  UBW had been 105-110 lbs. Patient is gaining her weight back slowly. She was 42.8 kg on 01/01/2018. She is currently 45 kg (99.2 lbs)  Meal Completion: 50%  Medications reviewed and include: Solu-Medrol 60 mg BID IV.  Labs reviewed.  Patient no longer meets criteria for malnutrition.  NUTRITION - FOCUSED PHYSICAL EXAM:    Most Recent Value  Orbital Region  No depletion  Upper Arm Region  Mild depletion  Thoracic and Lumbar Region  No depletion  Buccal Region  No depletion  Temple Region  Mild depletion  Clavicle Bone Region  Mild depletion   Clavicle and Acromion Bone Region  No depletion  Scapular Bone Region  No depletion  Dorsal Hand  No depletion  Patellar Region  Mild depletion  Anterior Thigh Region  Mild depletion  Posterior Calf Region  Mild depletion  Edema (RD Assessment)  None  Hair  Reviewed  Eyes  Reviewed  Mouth  Reviewed  Skin  Reviewed  Nails  Reviewed     Diet Order:   Diet Order            Diet regular Room service appropriate? Yes; Fluid consistency: Thin  Diet effective now              EDUCATION NEEDS:   Education needs have been addressed  Skin:  Skin Assessment: Reviewed RN Assessment  Last BM:  08/25/2018  Height:   Ht Readings from Last 1 Encounters:  01/01/18 5' (1.524 m)    Weight:   Wt Readings from Last 1 Encounters:  08/26/18 45 kg    Ideal Body Weight:  45.5 kg(calculated with height of 5' from previous encounter)  BMI:  Body mass index is 19.37 kg/m.  Estimated Nutritional Needs:   Kcal:  1250-1460 (MSJ x 1.2-1.4)  Protein:  60-70 grams (1.3-1.5 grams/kg)  Fluid:  1.2-1.4 L/day (1 mL/kcal)  Willey Blade, MS, RD, LDN Office: [phone] Pager: [phone] After Hours/Weekend Pager: [phone]

## 2018-08-27 LAB — GLUCOSE, CAPILLARY
Glucose-Capillary: 101 mg/dL — ABNORMAL HIGH (ref 70–99)
Glucose-Capillary: 102 mg/dL — ABNORMAL HIGH (ref 70–99)
Glucose-Capillary: 126 mg/dL — ABNORMAL HIGH (ref 70–99)

## 2018-08-27 MED ORDER — INSULIN ASPART 100 UNIT/ML ~~LOC~~ SOLN: 0.0000 [IU] | Freq: Every day | SUBCUTANEOUS | Status: DC

## 2018-08-27 MED ORDER — METHYLPREDNISOLONE SODIUM SUCC 40 MG IJ SOLR
40.0000 mg | Freq: Two times a day (BID) | INTRAMUSCULAR | Status: DC
  Administered 2018-08-28 (×2): 40 mg via INTRAVENOUS
  Filled 2018-08-27 (×2): qty 1

## 2018-08-27 MED ORDER — FLUOXETINE HCL 20 MG PO CAPS
40.0000 mg | ORAL_CAPSULE | Freq: Every day | ORAL | Status: DC
  Administered 2018-08-27 – 2018-08-29 (×3): 40 mg via ORAL
  Filled 2018-08-27 (×3): qty 2

## 2018-08-27 MED ORDER — INSULIN ASPART 100 UNIT/ML ~~LOC~~ SOLN
0.0000 [IU] | Freq: Three times a day (TID) | SUBCUTANEOUS | Status: DC
  Administered 2018-08-28 (×3): 1 [IU] via SUBCUTANEOUS
  Filled 2018-08-27 (×3): qty 1

## 2018-08-27 MED ORDER — SIMVASTATIN 20 MG PO TABS
40.0000 mg | ORAL_TABLET | Freq: Every day | ORAL | Status: DC
  Administered 2018-08-28 (×2): 40 mg via ORAL
  Filled 2018-08-27 (×2): qty 2

## 2018-08-27 NOTE — Plan of Care (Signed)

## 2018-08-27 NOTE — Progress Notes (Signed)
[facility] - [place] at [facility]   PATIENT NAME: Ashley Brooks    MR#:  [id number]  DATE OF BIRTH:  22-Oct-1946  SUBJECTIVE: admitted for COPD exacerbation.  She feels slightly better than yesterday.  CHIEF COMPLAINT:   Chief Complaint  Patient presents with  . Respiratory Distress  Continued shortness of breath on exertion he went to go to the bathroom.  REVIEW OF SYSTEMS:   ROS CONSTITUTIONAL: No fever, fatigue or weakness.  EYES: No blurred or double vision.  EARS, NOSE, AND THROAT: No tinnitus or ear pain.  RESPIRATORY:CARDIOVASCULAR: No chest pain, orthopnea, edema.  GASTROINTESTINAL: No nausea, vomiting, diarrhea or abdominal pain.  GENITOURINARY: No dysuria, hematuria.  ENDOCRINE: No polyuria, nocturia,  HEMATOLOGY: No anemia, easy bruising or bleeding SKIN: No rash or lesion. MUSCULOSKELETAL: No joint pain or arthritis.   NEUROLOGIC: No tingling, numbness, weakness.  PSYCHIATRY: No anxiety or depression.   DRUG ALLERGIES:  No Known Allergies  VITALS:  Blood pressure (!) 107/57, pulse 90, temperature 97.8 F (36.6 C), temperature source Oral, resp. rate 18, weight 46.8 kg, SpO2 95 %.  PHYSICAL EXAMINATION:  GENERAL:  72 y.o.-year-old patient lying in the bed with no acute distress.  EYES: Pupils equal, round, reactive to light and accommodation. No scleral icterus. Extraocular muscles intact.  HEENT: Head atraumatic, normocephalic. Oropharynx and nasopharynx clear.  NECK:  Supple, no jugular venous distention. No thyroid enlargement, no tenderness.  LUNGS: Clear to auscultation, no wheezing.  CARDIOVASCULAR: S1, S2 normal. No murmurs, rubs, or gallops.  ABDOMEN: Soft, nontender, nondistended. Bowel sounds present. No organomegaly or mass.  EXTREMITIES: No pedal edema, cyanosis, or clubbing.  NEUROLOGIC: Cranial nerves II through XII are intact. Muscle strength 5/5 in all extremities. Sensation intact. Gait not checked.  PSYCHIATRIC:  The patient is alert and oriented x 3.  SKIN: No obvious rash, lesion, or ulcer.    LABORATORY PANEL:   CBC Recent Labs  Lab 08/26/18 0602  WBC 3.5*  HGB 13.5  HCT 39.5  PLT 169   ------------------------------------------------------------------------------------------------------------------  Chemistries  Recent Labs  Lab 08/26/18 0602  NA 136  K 4.2  CL 99  CO2 28  GLUCOSE 198*  BUN 15  CREATININE 0.61  CALCIUM 8.7*   ------------------------------------------------------------------------------------------------------------------  Cardiac Enzymes Recent Labs  Lab 08/25/18 1949  TROPONINI <0.03   ------------------------------------------------------------------------------------------------------------------  RADIOLOGY:  Dg Chest 2 View  Result Date: 08/25/2018 CLINICAL DATA:  Respiratory distress since this morning. EXAM: CHEST - 2 VIEW COMPARISON:  12/31/2017 FINDINGS: Emphysematous hyperinflation of the lungs. Normal heart size. Mild aortic atherosclerosis without aneurysmal dilatation. No acute osseous abnormality. IMPRESSION: Similar findings of the chest with hyperinflated lungs and aortic atherosclerosis. Electronically Signed   By: Tollie Eth M.D.   On: 08/25/2018 15:14    EKG:   Orders placed or performed during the hospital encounter of 08/25/18  . EKG 12-Lead  . EKG 12-Lead  . ED EKG within 10 minutes  . ED EKG within 10 minutes    ASSESSMENT AND PLAN:   Respiratory distress secondary to COPD exacerbation: Precipitated by the paint that she used a new apartment.  Continue bronchodilators, IV steroids, clinically lung sounds better but she feels still short of breath on talking so continue bronchodilators.,  Decrease the dose of IV steroids, likely discharge home tomorrow.   #2 sinus tachycardia secondary to #1: Monitor 3.  Mild hyponatremia improved #5 high blood sugar secondary to steroids.  No history of diabetes.  Continue sliding  scale insulin  with coverage while on steroids. 4.  Patient told me that she has no family and she is trying to move to different apartment from [place] to [place] and this new apartment she is inhaling all the fumes from the patient and she thinks that is caused her shortness of breath.  Advised her to put a mask when she is involved in anything the paints.  All the records are reviewed and case discussed with Care Management/Social Workerr. Management plans discussed with the patient, family and they are in agreement.  CODE STATUS: full  TOTAL TIME TAKING CARE OF THIS PATIENT: 35 minutes.   POSSIBLE D/C IN 1-2 DAYS, DEPENDING ON CLINICAL CONDITION.   Katha Hamming M.D on 08/27/2018 at 11:40 AM  Between 7am to 6pm - Pager - [phone]  After 6pm go to [email or web address] - password [email or web address]  [facility]  Office  [phone]  CC: Primary care physician; Dorothey Baseman, MD   Note: This dictation was prepared with Dragon dictation along with smaller phrase technology. Any transcriptional errors that result from this process are unintentional.

## 2018-08-28 ENCOUNTER — Inpatient Hospital Stay: Payer: Medicare Other

## 2018-08-28 LAB — GLUCOSE, CAPILLARY
GLUCOSE-CAPILLARY: 127 mg/dL — AB (ref 70–99)
GLUCOSE-CAPILLARY: 150 mg/dL — AB (ref 70–99)
GLUCOSE-CAPILLARY: 123 mg/dL — AB (ref 70–99)
Glucose-Capillary: 134 mg/dL — ABNORMAL HIGH (ref 70–99)

## 2018-08-28 MED ORDER — ALPRAZOLAM 0.5 MG PO TABS: 0.5000 mg | ORAL_TABLET | Freq: Two times a day (BID) | ORAL | Status: DC | PRN

## 2018-08-28 MED ORDER — ALPRAZOLAM 0.25 MG PO TABS
0.2500 mg | ORAL_TABLET | Freq: Two times a day (BID) | ORAL | Status: DC | PRN
  Administered 2018-08-28 (×2): 0.25 mg via ORAL
  Filled 2018-08-28 (×2): qty 1

## 2018-08-28 MED ORDER — IPRATROPIUM-ALBUTEROL 0.5-2.5 (3) MG/3ML IN SOLN
3.0000 mL | Freq: Four times a day (QID) | RESPIRATORY_TRACT | Status: DC
  Administered 2018-08-29 (×2): 3 mL via RESPIRATORY_TRACT
  Filled 2018-08-28 (×2): qty 3

## 2018-08-28 MED ORDER — METHYLPREDNISOLONE SODIUM SUCC 125 MG IJ SOLR
60.0000 mg | Freq: Two times a day (BID) | INTRAMUSCULAR | Status: DC
  Administered 2018-08-28 – 2018-08-29 (×2): 60 mg via INTRAVENOUS
  Filled 2018-08-28 (×2): qty 2

## 2018-08-28 NOTE — Care Management Important Message (Signed)
Important Message  Patient Details  Name: Ashley Brooks MRN: [id number] Date of Birth: [birth year]   Medicare Important Message Given:  Yes    Olegario Messier A Raven Furnas 08/28/2018, 10:47 AM

## 2018-08-28 NOTE — Progress Notes (Signed)
[facility] - [place] at [facility]   PATIENT NAME: Ashley Brooks    MR#:  [id number]  DATE OF BIRTH:  22-Apr-1946  SUBJECTIVE: admitted for COPD exacerbation.  Wheezing today, more shortness of breath.  Patient is anxious.  Patient went to bathroom this morning and came back and sat in the chair with severe exertional dyspnea, wheezing.  CHIEF COMPLAINT:   Chief Complaint  Patient presents with  . Respiratory Distress    REVIEW OF SYSTEMS:   ROS CONSTITUTIONAL: No fever, fatigue or weakness.  EYES: No blurred or double vision.  EARS, NOSE, AND THROAT: No tinnitus or ear pain.  RESPIRATORY:still has sob, Has shortness of breath  CARDIOVASCULAR: No chest pain, orthopnea, edema.  GASTROINTESTINAL: No nausea, vomiting, diarrhea or abdominal pain.  GENITOURINARY: No dysuria, hematuria.  ENDOCRINE: No polyuria, nocturia,  HEMATOLOGY: No anemia, easy bruising or bleeding SKIN: No rash or lesion. MUSCULOSKELETAL: No joint pain or arthritis.   NEUROLOGIC: No tingling, numbness, weakness.  PSYCHIATRY: No anxiety or depression.   DRUG ALLERGIES:  No Known Allergies  VITALS:  Blood pressure (!) 141/73, pulse 95, temperature 97.9 F (36.6 C), temperature source Oral, resp. rate (!) 28, weight 47.7 kg, SpO2 94 %.  PHYSICAL EXAMINATION:  GENERAL:  72 y.o.-year-old patient lying in the bed with no acute distress.  EYES: Pupils equal, round, reactive to light and accommodation. No scleral icterus. Extraocular muscles intact.  HEENT: Head atraumatic, normocephalic. Oropharynx and nasopharynx clear.  NECK:  Supple, no jugular venous distention. No thyroid enlargement, no tenderness.  LUNGS: Patient has audible wheezing, expiratory wheeze in all lung fields   CARDIOVASCULAR: S1, S2 normal. No murmurs, rubs, or gallops.  ABDOMEN: Soft, nontender, nondistended. Bowel sounds present. No organomegaly or mass.  EXTREMITIES: No pedal edema, cyanosis, or clubbing.   NEUROLOGIC: Cranial nerves II through XII are intact. Muscle strength 5/5 in all extremities. Sensation intact. Gait not checked.  PSYCHIATRIC: The patient is alert and oriented x 3.  SKIN: No obvious rash, lesion, or ulcer.    LABORATORY PANEL:   CBC Recent Labs  Lab 08/26/18 0602  WBC 3.5*  HGB 13.5  HCT 39.5  PLT 169   ------------------------------------------------------------------------------------------------------------------  Chemistries  Recent Labs  Lab 08/26/18 0602  NA 136  K 4.2  CL 99  CO2 28  GLUCOSE 198*  BUN 15  CREATININE 0.61  CALCIUM 8.7*   ------------------------------------------------------------------------------------------------------------------  Cardiac Enzymes Recent Labs  Lab 08/25/18 1949  TROPONINI <0.03   ------------------------------------------------------------------------------------------------------------------  RADIOLOGY:  No results found.  EKG:   Orders placed or performed during the hospital encounter of 08/25/18  . EKG 12-Lead  . EKG 12-Lead  . ED EKG within 10 minutes  . ED EKG within 10 minutes    ASSESSMENT AND PLAN:   Respiratory distress secondary to COPD exacerbation: Precipitated by the paint that she used a new apartment.  Wheezing worse today with worsening shortness of breath: Repeat chest x-ray portable today, increase the steroids, continue bronchodilators, add antibiotics.  Patient still has productive cough. #2 sinus tachycardia secondary to #1: Monitor 3.  Mild hyponatremia improved #5 high blood sugar secondary to steroids.  No history of diabetes.  Continue sliding scale insulin with coverage while on steroids. 4.  Depression, anxiety: Continue antidepressants, added Xanax.    All the records are reviewed and case discussed with Care Management/Social Workerr. Management plans discussed with the patient, family and they are in agreement.  CODE STATUS: full  TOTAL TIME TAKING CARE  OF  THIS PATIENT: 35 minutes.   POSSIBLE D/C IN 1-2 DAYS, DEPENDING ON CLINICAL CONDITION.   Katha Hamming M.D on 08/28/2018 at 10:06 AM  Between 7am to 6pm - Pager - [phone]  After 6pm go to [email or web address] - password [email or web address]  [facility]  Office  [phone]  CC: Primary care physician; Dorothey Baseman, MD   Note: This dictation was prepared with Dragon dictation along with smaller phrase technology. Any transcriptional errors that result from this process are unintentional.

## 2018-08-29 LAB — GLUCOSE, CAPILLARY: Glucose-Capillary: 110 mg/dL — ABNORMAL HIGH (ref 70–99)

## 2018-08-29 MED ORDER — AZITHROMYCIN 250 MG PO TABS: ORAL_TABLET | ORAL | 0 refills | Status: AC

## 2018-08-29 MED ORDER — ALPRAZOLAM 0.5 MG PO TABS: 0.2500 mg | ORAL_TABLET | Freq: Every evening | ORAL | 0 refills | Status: DC | PRN

## 2018-08-29 MED ORDER — PREDNISONE 10 MG (21) PO TBPK: ORAL_TABLET | ORAL | 0 refills | Status: DC

## 2018-08-29 MED ORDER — ADULT MULTIVITAMIN W/MINERALS CH: 1.0000 | ORAL_TABLET | Freq: Every day | ORAL | 0 refills | Status: DC

## 2018-08-29 NOTE — Plan of Care (Signed)

## 2018-08-29 NOTE — Progress Notes (Signed)
Patient doing better, discharged home today.  Tapering course of prednisone, antibiotics, Xanax for limited time.  Patient advised to follow with PCP regarding further Xanax prescriptions. Spent on discharge preparation, talking to the patient took more than 30 minutes.  Discharge summary to follow.

## 2018-09-03 NOTE — Discharge Summary (Signed)
Ashley Brooks, is a 72 y.o. female  DOB 08/17/1946  MRN [id number].  Admission date:  08/25/2018  Admitting Physician  Milagros Loll, MD  Discharge Date:  08/29/2018   Primary MD  Dorothey Baseman, MD  Recommendations for primary care physician for things to follow: PCP in 1 week   Admission Diagnosis  COPD exacerbation (HCC) [J44.1] Acute respiratory failure with hypoxia (HCC) [J96.01]   Discharge Diagnosis  COPD exacerbation (HCC) [J44.1] Acute respiratory failure with hypoxia (HCC) [J96.01]    Active Problems:   COPD exacerbation (HCC)      Past Medical History:  Diagnosis Date  . Anxiety   . COPD (chronic obstructive pulmonary disease) (HCC)   . HLD (hyperlipidemia)   . Personal history of tobacco use, presenting hazards to health 12/06/2015    Past Surgical History:  Procedure Laterality Date  . CATARACT EXTRACTION    . right foot fusion    . TUBAL LIGATION         History of present illness and  Hospital Course:     Kindly see H&P for history of present illness and admission details, please review complete Labs, Consult reports and Test reports for all details in brief  HPI  from the history and physical done on the day of admission  72 year old female with history of COPD, past tobacco abuse complains of shortness of breath for 3 days, admitted to for COPD exacerbation.  Hospital Course  Acute COPD exacerbation with hypoxic respiratory failure, received IV steroids, bronchodilators with nebs, chest x-ray did not show pneumonia, symptoms improved with IV steroids, bronchodilators, discharged home with tapering course of steroids.  Patient is advised to continue Anoro Ellipta, duo nebs at home.  2.  Anxiety, patient requests refill of prescription given limited supply of Xanax advised her to  follow-up with PCP regarding anxiety medicines. 3.  Mild hyponatremia: Improved with IV fluids.     Discharge Condition: Stable Follow UP  Follow-up Information    Dorothey Baseman, MD. Schedule an appointment as soon as possible for a visit in 1 week(s).   Specialty:  Family Medicine Contact information: [address] [place] [address] [phone]             Discharge Instructions  and  Discharge Medications     Allergies as of 08/29/2018   No Known Allergies     Medication List    STOP taking these medications   mometasone-formoterol 100-5 MCG/ACT Aero Commonly known as:  DULERA   predniSONE 20 MG tablet Commonly known as:  DELTASONE Replaced by:  predniSONE 10 MG (21) Tbpk tablet     TAKE these medications   ALPRAZolam 0.5 MG tablet Commonly known as:  XANAX Take 0.5 tablets (0.25 mg total) by mouth at bedtime as needed for anxiety or sleep.   ANORO ELLIPTA 62.5-25 MCG/INH Aepb Generic drug:  umeclidinium-vilanterol Inhale 1 puff into the lungs daily.   aspirin 325 MG tablet Take 325 mg by mouth daily as needed for headache.   azithromycin 250 MG tablet Commonly known as:  ZITHROMAX Take 2 tablets (500 mg) on  Day 1,  followed by 1 tablet (250 mg) once daily on Days 2 through 5. What changed:    medication strength  how much to take  how to take this  when to take this  additional instructions   diphenhydrAMINE 25 mg capsule Commonly known as:  BENADRYL Take 25 mg by mouth every 6 (six) hours as needed for  itching or allergies.   feeding supplement (ENSURE ENLIVE) Liqd Take 237 mLs by mouth 2 (two) times daily between meals.   FLUoxetine 40 MG capsule Commonly known as:  PROZAC Take 40 mg by mouth daily.   guaiFENesin 600 MG 12 hr tablet Commonly known as:  MUCINEX Take 1 tablet (600 mg total) by mouth 2 (two) times daily.   ipratropium-albuterol 0.5-2.5 (3) MG/3ML Soln Commonly known as:  DUONEB Take 3 mLs by  nebulization every 2 (two) hours as needed.   multivitamin with minerals Tabs tablet Take 1 tablet by mouth daily.   predniSONE 10 MG (21) Tbpk tablet Commonly known as:  STERAPRED UNI-PAK 21 TAB Taper by 10 mg daily Replaces:  predniSONE 20 MG tablet   simvastatin 40 MG tablet Commonly known as:  ZOCOR Take 1 tablet by mouth at bedtime.   VENTOLIN HFA 108 (90 Base) MCG/ACT inhaler Generic drug:  albuterol Inhale 2 puffs into the lungs every 4 (four) hours as needed for wheezing.         Diet and Activity recommendation: See Discharge Instructions above   Consults obtained -none   Major procedures and Radiology Reports - PLEASE review detailed and final reports for all details, in brief -      Dg Chest 2 View  Result Date: 08/25/2018 CLINICAL DATA:  Respiratory distress since this morning. EXAM: CHEST - 2 VIEW COMPARISON:  12/31/2017 FINDINGS: Emphysematous hyperinflation of the lungs. Normal heart size. Mild aortic atherosclerosis without aneurysmal dilatation. No acute osseous abnormality. IMPRESSION: Similar findings of the chest with hyperinflated lungs and aortic atherosclerosis. Electronically Signed   By: Tollie Eth M.D.   On: 08/25/2018 15:14   Dg Chest Port 1 View  Result Date: 08/28/2018 CLINICAL DATA:  Shortness of breath EXAM: PORTABLE CHEST 1 VIEW COMPARISON:  08/25/2018 FINDINGS: Cardiac shadows within normal limits. Mild aortic calcifications are again identified and stable. The lungs are again hyperinflated without focal infiltrate or sizable effusion. No acute bony abnormality is noted. Mild stable scoliosis is again seen. IMPRESSION: COPD without acute abnormality. Electronically Signed   By: Alcide Clever M.D.   On: 08/28/2018 10:19    Micro Results     No results found for this or any previous visit (from the past 240 hour(s)).     Today   Subjective:   Ashley Brooks today has no headache,no chest abdominal pain,no new weakness tingling  or numbness, feels much better wants to go home today.   Objective:   Blood pressure 126/64, pulse 78, temperature 97.8 F (36.6 C), temperature source Oral, resp. rate (!) 22, weight 47.7 kg, SpO2 93 %.  No intake or output data in the 24 hours ending 09/03/18 1325  Exam Awake Alert, Oriented x 3, No new F.N deficits, Normal affect [place].AT,PERRAL Supple Neck,No JVD, No cervical lymphadenopathy appriciated.  Symmetrical Chest wall movement, Good air movement bilaterally, CTAB RRR,No Gallops,Rubs or new Murmurs, No Parasternal Heave +ve B.Sounds, Abd Soft, Non tender, No organomegaly appriciated, No rebound -guarding or rigidity. No Cyanosis, Clubbing or edema, No new Rash or bruise  Data Review   CBC w Diff:  Lab Results  Component Value Date   WBC 3.5 (L) 08/26/2018   HGB 13.5 08/26/2018   HCT 39.5 08/26/2018   PLT 169 08/26/2018   LYMPHOPCT 6 12/31/2017   MONOPCT 8 12/31/2017   EOSPCT 0 12/31/2017   BASOPCT 1 12/31/2017    CMP:  Lab Results  Component Value Date   NA 136 08/26/2018  K 4.2 08/26/2018   CL 99 08/26/2018   CO2 28 08/26/2018   BUN 15 08/26/2018   CREATININE 0.61 08/26/2018  .   Total Time in preparing paper work, data evaluation and todays exam - 35 minutes  Katha Hamming M.D on 08/29/2018 at 1:25 PM    Note: This dictation was prepared with Dragon dictation along with smaller phrase technology. Any transcriptional errors that result from this process are unintentional.

## 2018-12-05 ENCOUNTER — Telehealth: Payer: Self-pay | Admitting: *Deleted

## 2018-12-05 ENCOUNTER — Telehealth: Payer: Self-pay

## 2018-12-05 ENCOUNTER — Encounter: Payer: Self-pay | Admitting: *Deleted

## 2018-12-05 DIAGNOSIS — Z122 Encounter for screening for malignant neoplasm of respiratory organs: Secondary | ICD-10-CM

## 2018-12-05 NOTE — Telephone Encounter (Signed)
Call pt regarding lung screening. Left message to return call. 

## 2018-12-05 NOTE — Telephone Encounter (Signed)
Patient has been notified that the annual lung cancer screening low dose CT scan is due currently or will be in the near future.  Confirmed that the patient is within the age range of 34-80, and asymptomatic, and currently exhibits no signs or symptoms of lung cancer.  Patient denies illness that would prevent curative treatment for lung cancer if found.  Verified smoking history, former smoker quit march 2019 with 42 pkyr hx.  The shared decision making visit was completed on 12-07-15.  Patient is agreeable for the CT scan to be scheduled.  Will call patient back with date and time of appointment.

## 2018-12-10 ENCOUNTER — Telehealth: Payer: Self-pay | Admitting: *Deleted

## 2018-12-10 NOTE — Telephone Encounter (Signed)
Called pt to inform her of her appt for ldct screening on Tuesday 12/23/2018 here @ [facility] @ 11:45am, voiced understanding. appt mailed to pt

## 2018-12-23 ENCOUNTER — Ambulatory Visit
Admission: RE | Admit: 2018-12-23 | Discharge: 2018-12-23 | Disposition: A | Discharge status: Home / Self Care | Payer: Medicare PPO | Source: Ambulatory Visit | Attending: Oncology | Admitting: Oncology

## 2018-12-23 DIAGNOSIS — Z122 Encounter for screening for malignant neoplasm of respiratory organs: Secondary | ICD-10-CM

## 2018-12-23 DIAGNOSIS — Z87891 Personal history of nicotine dependence: Secondary | ICD-10-CM | POA: Insufficient documentation

## 2018-12-24 ENCOUNTER — Encounter: Payer: Self-pay | Admitting: *Deleted

## 2018-12-26 ENCOUNTER — Encounter: Payer: Self-pay | Admitting: *Deleted

## 2019-06-02 ENCOUNTER — Other Ambulatory Visit: Payer: Self-pay | Admitting: Family Medicine

## 2019-06-02 DIAGNOSIS — Z1231 Encounter for screening mammogram for malignant neoplasm of breast: Secondary | ICD-10-CM

## 2019-07-23 ENCOUNTER — Ambulatory Visit
Admission: RE | Admit: 2019-07-23 | Discharge: 2019-07-23 | Disposition: A | Discharge status: Home / Self Care | Payer: Medicare PPO | Source: Ambulatory Visit | Attending: Family Medicine | Admitting: Family Medicine

## 2019-07-23 DIAGNOSIS — Z1231 Encounter for screening mammogram for malignant neoplasm of breast: Secondary | ICD-10-CM | POA: Diagnosis not present

## 2019-08-24 IMAGING — DX DG CHEST 1V PORT
1 series · 1 of 1 positions shown · non-contrast
Comparison: 08/25/2018

CLINICAL DATA: Shortness of breath

EXAM:
PORTABLE CHEST 1 VIEW

[chest ap]
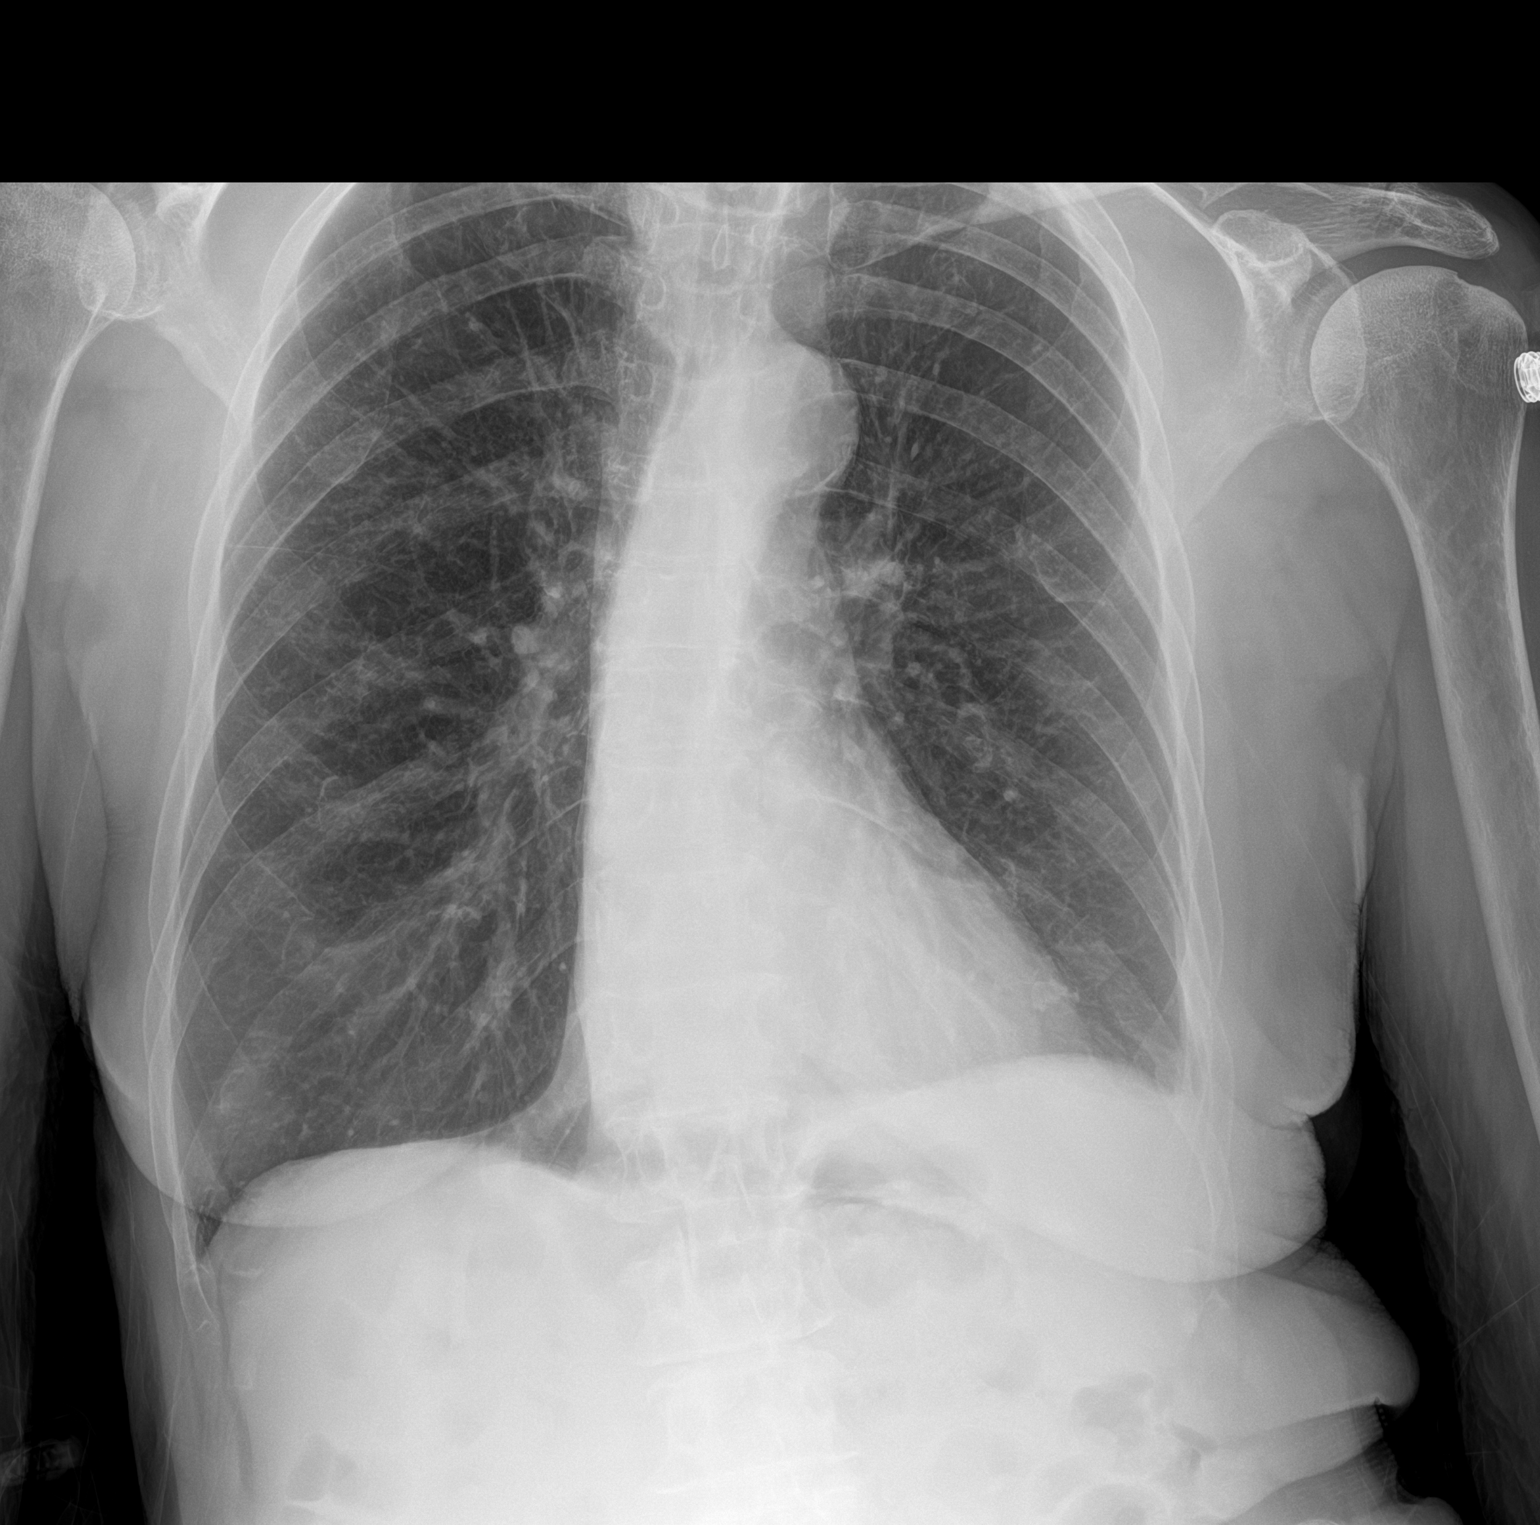

[1 of 1 positions shown; findings below may reference images not displayed]

FINDINGS: Cardiac shadows within normal limits. Mild aortic calcifications are
again identified and stable. The lungs are again hyperinflated
without focal infiltrate or sizable effusion. No acute bony
abnormality is noted. Mild stable scoliosis is again seen.
IMPRESSION: COPD without acute abnormality.

## 2019-12-10 ENCOUNTER — Telehealth: Payer: Self-pay | Admitting: *Deleted

## 2019-12-10 NOTE — Telephone Encounter (Signed)
(  12/10/19) Left message for patient to notify them that it is time to schedule annual low dose lung cancer screening CT scan. Instructed patient to call back to verify information prior to the scan being scheduled [facility]     

## 2019-12-25 ENCOUNTER — Telehealth: Payer: Self-pay | Admitting: *Deleted

## 2019-12-25 NOTE — Telephone Encounter (Signed)
Left message for patient to notify them that it is time to schedule annual low dose lung cancer screening CT scan. Instructed patient to call back to verify information prior to the scan being scheduled.  

## 2020-01-14 ENCOUNTER — Telehealth: Payer: Self-pay

## 2020-01-14 NOTE — Telephone Encounter (Signed)
Message left notifying patient that it is time to schedule annual lung cancer screening low dose CT scan.  Instructed patient to return call to Glenna Fellows at [phone] to verify information prior to CT scan being scheduled.

## 2020-05-05 ENCOUNTER — Encounter: Payer: Self-pay | Admitting: *Deleted

## 2020-05-11 ENCOUNTER — Emergency Department
Admission: EM | Admit: 2020-05-11 | Discharge: 2020-05-11 | Disposition: A | Discharge status: Home / Self Care | Payer: Medicare PPO | Attending: Emergency Medicine | Admitting: Emergency Medicine

## 2020-05-11 ENCOUNTER — Encounter: Payer: Self-pay | Admitting: Emergency Medicine

## 2020-05-11 ENCOUNTER — Other Ambulatory Visit: Payer: Self-pay

## 2020-05-11 DIAGNOSIS — Y999 Unspecified external cause status: Secondary | ICD-10-CM | POA: Diagnosis not present

## 2020-05-11 DIAGNOSIS — Z7982 Long term (current) use of aspirin: Secondary | ICD-10-CM | POA: Diagnosis not present

## 2020-05-11 DIAGNOSIS — S51812A Laceration without foreign body of left forearm, initial encounter: Secondary | ICD-10-CM | POA: Diagnosis present

## 2020-05-11 DIAGNOSIS — Z7951 Long term (current) use of inhaled steroids: Secondary | ICD-10-CM | POA: Insufficient documentation

## 2020-05-11 DIAGNOSIS — Y9289 Other specified places as the place of occurrence of the external cause: Secondary | ICD-10-CM | POA: Insufficient documentation

## 2020-05-11 DIAGNOSIS — Y939 Activity, unspecified: Secondary | ICD-10-CM | POA: Insufficient documentation

## 2020-05-11 DIAGNOSIS — W01198A Fall on same level from slipping, tripping and stumbling with subsequent striking against other object, initial encounter: Secondary | ICD-10-CM | POA: Diagnosis not present

## 2020-05-11 DIAGNOSIS — J441 Chronic obstructive pulmonary disease with (acute) exacerbation: Secondary | ICD-10-CM | POA: Insufficient documentation

## 2020-05-11 DIAGNOSIS — Z87891 Personal history of nicotine dependence: Secondary | ICD-10-CM | POA: Insufficient documentation

## 2020-05-11 MED ORDER — CEPHALEXIN 500 MG PO CAPS: 1000.0000 mg | ORAL_CAPSULE | Freq: Two times a day (BID) | ORAL | 0 refills | Status: DC

## 2020-05-11 NOTE — ED Provider Notes (Signed)
[facility] Emergency Department Provider Note  ____________________________________________  Time seen: Approximately 4:08 PM  I have reviewed the triage vital signs and the nursing notes.   HISTORY  Chief Complaint Fall    HPI Ashley Brooks is a 74 y.o. female who presents the emergency department complaining of skin tear to left forearm.  Patient was seen ENT when she accidentally tripped, falling sustaining a skin tear to left forearm.  Nursing staff wrapped her arm and referred to the emergency department.  Patient has no arm pain only skin tear injury.  She denied her head or lose consciousness.  She denies any headache, neck pain, chest pain, shortness of breath.  Again this was mechanical in nature with no syncope prior to or after fall.         Past Medical History:  Diagnosis Date  . Anxiety   . COPD (chronic obstructive pulmonary disease) (HCC)   . HLD (hyperlipidemia)   . Personal history of tobacco use, presenting hazards to health 12/06/2015    Patient Active Problem List   Diagnosis Date Noted  . COPD exacerbation (HCC) 08/25/2018  . Malnutrition of moderate degree 01/02/2018  . COPD with acute exacerbation (HCC) 01/01/2018  . Anxiety 01/01/2018  . HLD (hyperlipidemia) 01/01/2018  . Tobacco use 12/06/2015    Past Surgical History:  Procedure Laterality Date  . CATARACT EXTRACTION    . right foot fusion    . TUBAL LIGATION      Prior to Admission medications   Medication Sig Start Date End Date Taking? Authorizing Provider  ALPRAZolam Prudy Feeler) 0.5 MG tablet Take 0.5 tablets (0.25 mg total) by mouth at bedtime as needed for anxiety or sleep. 08/29/18   Katha Hamming, MD  ANORO ELLIPTA 62.5-25 MCG/INH AEPB Inhale 1 puff into the lungs daily.  07/11/18   [provider]  aspirin 325 MG tablet Take 325 mg by mouth daily as needed for headache.    [provider]  cephALEXin (KEFLEX) 500 MG capsule Take 2  capsules (1,000 mg total) by mouth 2 (two) times daily. 05/11/20   Charliene Inoue, Delorise Royals, PA-C  diphenhydrAMINE (BENADRYL) 25 mg capsule Take 25 mg by mouth every 6 (six) hours as needed for itching or allergies.    [provider]  feeding supplement, ENSURE ENLIVE, (ENSURE ENLIVE) LIQD Take 237 mLs by mouth 2 (two) times daily between meals. 01/02/18   Enedina Finner, MD  FLUoxetine (PROZAC) 40 MG capsule Take 40 mg by mouth daily.     [provider]  ipratropium-albuterol (DUONEB) 0.5-2.5 (3) MG/3ML SOLN Take 3 mLs by nebulization every 2 (two) hours as needed. 01/02/18   Enedina Finner, MD  Multiple Vitamin (MULTIVITAMIN WITH MINERALS) TABS tablet Take 1 tablet by mouth daily. 08/29/18   Katha Hamming, MD  predniSONE (STERAPRED UNI-PAK 21 TAB) 10 MG (21) TBPK tablet Taper by 10 mg daily 08/29/18   Katha Hamming, MD  simvastatin (ZOCOR) 40 MG tablet Take 1 tablet by mouth at bedtime. 08/28/16   [provider]  VENTOLIN HFA 108 (90 Base) MCG/ACT inhaler Inhale 2 puffs into the lungs every 4 (four) hours as needed for wheezing. 06/03/18   [provider]    Allergies Patient has no known allergies.  Family History  Problem Relation Age of Onset  . Colon cancer Mother   . Breast cancer Neg Hx     Social History Social History   Tobacco Use  . Smoking status: Former Smoker  Packs/day: 1.00    Years: 42.00    Pack years: 42.00    Quit date: 12/26/2017    Years since quitting: 2.3  . Smokeless tobacco: Never Used  Substance Use Topics  . Alcohol use: Yes    Alcohol/week: 2.0 standard drinks    Types: 2 Glasses of wine per week  . Drug use: No     Review of Systems  Constitutional: No fever/chills Eyes: No visual changes. No discharge ENT: No upper respiratory complaints. Cardiovascular: no chest pain. Respiratory: no cough. No SOB. Gastrointestinal: No abdominal pain.  No nausea, no vomiting.  No diarrhea.  No  constipation. Musculoskeletal: Negative for musculoskeletal pain. Skin: Skin tear to left forearm Neurological: Negative for headaches, focal weakness or numbness. 10-point ROS otherwise negative.  ____________________________________________   PHYSICAL EXAM:  VITAL SIGNS: ED Triage Vitals  Enc Vitals Group     BP 05/11/20 1449 (!) 131/100     Pulse Rate 05/11/20 1449 86     Resp 05/11/20 1449 16     Temp 05/11/20 1449 98 F (36.7 C)     Temp Source 05/11/20 1449 Oral     SpO2 05/11/20 1449 95 %     Weight 05/11/20 1450 105 lb (47.6 kg)     Height 05/11/20 1450 5' (1.524 m)     Head Circumference --      Peak Flow --      Pain Score 05/11/20 1449 4     Pain Loc --      Pain Edu? --      Excl. in GC? --      Constitutional: Alert and oriented. Well appearing and in no acute distress. Eyes: Conjunctivae are normal. PERRL. EOMI. Head: Atraumatic. ENT:      Ears:       Nose: No congestion/rhinnorhea.      Mouth/Throat: Mucous membranes are moist.  Neck: No stridor.    Cardiovascular: Normal rate, regular rhythm. Normal S1 and S2.  Good peripheral circulation. Respiratory: Normal respiratory effort without tachypnea or retractions. Lungs CTAB. Good air entry to the bases with no decreased or absent breath sounds. Musculoskeletal: Full range of motion to all extremities. No gross deformities appreciated.  Visualization of the left forearm reveals 2 skin tears to the forearm.  Wound is very small measuring approximately 1.5 cm in length.  The other is relatively large, causing a flap of skin.  Edge measures approximately 12 cm in length.  Skin is extremely fragile and with attempts at reapproximating edges cause further skin tear of the flap.  Bleeding is controlled direct pressure, however with movement of the skin flap this starts bleeding.  No foreign body. Neurologic:  Normal speech and language. No gross focal neurologic deficits are appreciated.  Skin:  Skin is warm, dry  and intact. No rash noted. Psychiatric: Mood and affect are normal. Speech and behavior are normal. Patient exhibits appropriate insight and judgement.   ____________________________________________   LABS (all labs ordered are listed, but only abnormal results are displayed)  Labs Reviewed - No data to display ____________________________________________  EKG   ____________________________________________  RADIOLOGY   No results found.  ____________________________________________    PROCEDURES  Procedure(s) performed:    Marland KitchenMarland KitchenLaceration Repair  Date/Time: 05/11/2020 4:56 PM Performed by: Racheal Patches, PA-C Authorized by: Racheal Patches, PA-C   Consent:    Consent obtained:  Verbal   Consent given by:  Patient   Risks discussed:  Pain, poor wound healing, infection and  need for additional repair Anesthesia (see MAR for exact dosages):    Anesthesia method:  None Laceration details:    Location:  Shoulder/arm   Shoulder/arm location:  L lower arm   Length (cm):  12 Repair type:    Repair type:  Simple Exploration:    Hemostasis achieved with:  Direct pressure   Wound exploration: wound explored through full range of motion and entire depth of wound probed and visualized     Wound extent: no foreign bodies/material noted and no underlying fracture noted   Treatment:    Area cleansed with:  Saline   Amount of cleaning:  Standard Skin repair:    Repair method:  Steri-Strips   Number of Steri-Strips:  3 (3 dermaclips applied) Approximation:    Approximation:  Loose Post-procedure details:    Dressing:  Sterile dressing and tube gauze   Patient tolerance of procedure:  Tolerated well, no immediate complications Comments:     Edges are approximated, stabilized using derma clips.  Area is then covered in gauze and wrapped in Coban.      Medications - No data to display   ____________________________________________   INITIAL IMPRESSION  / ASSESSMENT AND PLAN / ED COURSE  Pertinent labs & imaging results that were available during my care of the patient were reviewed by me and considered in my medical decision making (see chart for details).  Review of the [place] CSRS was performed in accordance of the [facility] prior to dispensing any controlled drugs.           Patient's diagnosis is consistent with skin tear to left forearm.  Patient presented to emergency department after tripping and sustaining a skin tear to the forearm.  Patient denied any other complaints.  Exam was reassuring.  Given the nature of injury, this was not amenable to sutures as the skin was too fragile.  I used derma clips to approximate the edges of the skin tear and rewrapped the area with Coban.  Patient to follow-up with primary care to ensure proper wound healing.  Antibiotics prophylactically..Patient is given ED precautions to return to the ED for any worsening or new symptoms.     ____________________________________________  FINAL CLINICAL IMPRESSION(S) / ED DIAGNOSES  Final diagnoses:  Skin tear of left forearm without complication, initial encounter      NEW MEDICATIONS STARTED DURING THIS VISIT:  ED Discharge Orders         Ordered    cephALEXin (KEFLEX) 500 MG capsule  2 times daily     Discontinue  Reprint     05/11/20 1647              This chart was dictated using voice recognition software/Dragon. Despite best efforts to proofread, errors can occur which can change the meaning. Any change was purely unintentional.    Racheal Patches, PA-C 05/11/20 1657    Dionne Bucy, MD 05/11/20 1726

## 2020-05-11 NOTE — ED Notes (Signed)
See triage note  Presents s/p trip and fall    States she was going to ENT office  Skin tear noted on forearm  Denies any other sx's

## 2020-05-11 NOTE — ED Triage Notes (Signed)
Patient reports she was going into ENT and tripped outside causing her to fall. Skin tear noted to left forearm. Dressing applied by EMS. Patient denies hitting head or LOC. Denies any other injuries.

## 2020-05-11 NOTE — ED Triage Notes (Signed)
Pt in via EMS from the clinic. Pt fell over the curb and has a ski tear on left arm. Area dressed. 143/95

## 2020-05-20 ENCOUNTER — Telehealth: Payer: Self-pay | Admitting: *Deleted

## 2020-05-20 DIAGNOSIS — Z122 Encounter for screening for malignant neoplasm of respiratory organs: Secondary | ICD-10-CM

## 2020-05-20 DIAGNOSIS — Z87891 Personal history of nicotine dependence: Secondary | ICD-10-CM

## 2020-05-20 NOTE — Telephone Encounter (Signed)
Patient has been notified that annual lung cancer screening low dose CT scan is due currently or will be in near future. Confirmed that patient is within the age range of 55-77, and asymptomatic, (no signs or symptoms of lung cancer). Patient denies illness that would prevent curative treatment for lung cancer if found. Verified smoking history, (former, quit 2019, 42 pack year). The shared decision making visit was done 12/07/15. Patient is agreeable for CT scan being scheduled.

## 2020-05-31 ENCOUNTER — Other Ambulatory Visit: Payer: Self-pay

## 2020-05-31 ENCOUNTER — Ambulatory Visit
Admission: RE | Admit: 2020-05-31 | Discharge: 2020-05-31 | Disposition: A | Discharge status: Home / Self Care | Payer: Medicare PPO | Source: Ambulatory Visit | Attending: Oncology | Admitting: Oncology

## 2020-05-31 DIAGNOSIS — Z87891 Personal history of nicotine dependence: Secondary | ICD-10-CM | POA: Diagnosis present

## 2020-05-31 DIAGNOSIS — Z122 Encounter for screening for malignant neoplasm of respiratory organs: Secondary | ICD-10-CM | POA: Diagnosis present

## 2020-05-31 NOTE — Progress Notes (Signed)
Jorene Minors, NP 05/31/2020 2:44 PM

## 2020-06-02 ENCOUNTER — Telehealth: Payer: Self-pay | Admitting: *Deleted

## 2020-06-02 NOTE — Telephone Encounter (Signed)
Voicemail left for patient to call back to review lung screening results and recommendations.

## 2020-06-03 ENCOUNTER — Telehealth: Payer: Self-pay | Admitting: *Deleted

## 2020-06-03 NOTE — Telephone Encounter (Signed)
After review of imaging with Dr. Karna Christmas and other Thoracic nurse navigator, Notified patient of LDCT lung cancer screening program results with recommendation for 4 month follow up imaging. Also notified of incidental findings noted below and is encouraged to discuss further with PCP who will receive a copy of this note and/or the CT report. Patient verbalizes understanding.   IMPRESSION: 1. Lung-RADS 4B, suspicious. Two new left upper lobe pulmonary nodules, largest a solid 8.7 mm apical left upper lobe nodule. Additional imaging evaluation or consultation with Pulmonology or Thoracic Surgery recommended. 2. New patchy ground-glass opacity in the central left upper lobe, nonspecific, potentially inflammatory. 3. Two-vessel coronary atherosclerosis. 4. Aortic Atherosclerosis (ICD10-I70.0) and Emphysema (ICD10-J43.9).

## 2020-06-03 NOTE — Telephone Encounter (Signed)
PCP Nurse notified of abnormal findings and plan of care.

## 2020-09-06 ENCOUNTER — Encounter: Payer: Self-pay | Admitting: Emergency Medicine

## 2020-09-06 ENCOUNTER — Other Ambulatory Visit: Payer: Self-pay

## 2020-09-06 ENCOUNTER — Emergency Department: Payer: Medicare PPO

## 2020-09-06 ENCOUNTER — Emergency Department
Admission: EM | Admit: 2020-09-06 | Discharge: 2020-09-06 | Disposition: A | Discharge status: Home / Self Care | Payer: Medicare PPO | Attending: Student in an Organized Health Care Education/Training Program | Admitting: Student in an Organized Health Care Education/Training Program

## 2020-09-06 DIAGNOSIS — Z87891 Personal history of nicotine dependence: Secondary | ICD-10-CM | POA: Diagnosis not present

## 2020-09-06 DIAGNOSIS — Z20822 Contact with and (suspected) exposure to covid-19: Secondary | ICD-10-CM | POA: Diagnosis not present

## 2020-09-06 DIAGNOSIS — J441 Chronic obstructive pulmonary disease with (acute) exacerbation: Secondary | ICD-10-CM

## 2020-09-06 DIAGNOSIS — R0602 Shortness of breath: Secondary | ICD-10-CM | POA: Diagnosis present

## 2020-09-06 LAB — BASIC METABOLIC PANEL
Anion gap: 14 (ref 5–15)
BUN: 17 mg/dL (ref 8–23)
CO2: 21 mmol/L — ABNORMAL LOW (ref 22–32)
Calcium: 8.9 mg/dL (ref 8.9–10.3)
Chloride: 99 mmol/L (ref 98–111)
Creatinine, Ser: 0.55 mg/dL (ref 0.44–1.00)
GFR, Estimated: >60 mL/min (ref >60)
Glucose, Bld: 95 mg/dL (ref 70–99)
Potassium: 3.8 mmol/L (ref 3.5–5.1)
Sodium: 134 mmol/L — ABNORMAL LOW (ref 135–145)

## 2020-09-06 LAB — CBC
HCT: 39.7 % (ref 36.0–46.0)
Hemoglobin: 14.1 g/dL (ref 12.0–15.0)
MCH: 32 pg (ref 26.0–34.0)
MCHC: 35.5 g/dL (ref 30.0–36.0)
MCV: 90.2 fL (ref 80.0–100.0)
Platelets: 146 10*3/uL — ABNORMAL LOW (ref 150–400)
RBC: 4.4 MIL/uL (ref 3.87–5.11)
RDW: 12.5 % (ref 11.5–15.5)
WBC: 6.7 10*3/uL (ref 4.0–10.5)
nRBC: 0 % (ref 0.0–0.2)

## 2020-09-06 LAB — TROPONIN I (HIGH SENSITIVITY): Troponin I (High Sensitivity): 3 ng/L (ref <18)

## 2020-09-06 LAB — RESPIRATORY PANEL BY RT PCR (FLU A&B, COVID)
Influenza A by PCR: NEGATIVE
Influenza B by PCR: NEGATIVE
SARS Coronavirus 2 by RT PCR: NEGATIVE

## 2020-09-06 MED ORDER — IPRATROPIUM-ALBUTEROL 0.5-2.5 (3) MG/3ML IN SOLN
3.0000 mL | Freq: Once | RESPIRATORY_TRACT | Status: AC
  Administered 2020-09-06: 3 mL via RESPIRATORY_TRACT
  Filled 2020-09-06: qty 3

## 2020-09-06 MED ORDER — METHYLPREDNISOLONE SODIUM SUCC 125 MG IJ SOLR
125.0000 mg | Freq: Once | INTRAMUSCULAR | Status: AC
  Administered 2020-09-06: 125 mg via INTRAVENOUS
  Filled 2020-09-06: qty 2

## 2020-09-06 MED ORDER — PREDNISONE 10 MG (21) PO TBPK: ORAL_TABLET | ORAL | 0 refills | Status: DC

## 2020-09-06 NOTE — ED Notes (Signed)
Pt stays within the mid to upper 90's Oxygen saturation while ambulating

## 2020-09-06 NOTE — ED Triage Notes (Signed)
Patient to ER for c/o shortness of breath x approx 1 week. Patient reports h/o COPD. States she used nebulizer this am with some relief.

## 2020-09-06 NOTE — ED Provider Notes (Signed)
[facility] Emergency Department Provider Note    First MD Initiated Contact with Patient 09/06/20 1048     (approximate)  I have reviewed the triage vital signs and the nursing notes.   HISTORY  Chief Complaint Shortness of Breath    HPI Ashley Brooks is a 74 y.o. female with a history of COPD  not on home oxygen presents to the ER for shortness of breath and wheezing.  States she is been taking her nebulizers for the past several days and does feel improvement of for short period of time.  Is not currently on steroids no recent antibiotics.  Is not having productive cough.  Does state that temperature changes do make symptoms worse.  Feels like she does need some nebulizers as well as some steroids.   Past Medical History:  Diagnosis Date  . Anxiety   . COPD (chronic obstructive pulmonary disease) (HCC)   . HLD (hyperlipidemia)   . Personal history of tobacco use, presenting hazards to health 12/06/2015   Family History  Problem Relation Age of Onset  . Colon cancer Mother   . Breast cancer Neg Hx    Past Surgical History:  Procedure Laterality Date  . CATARACT EXTRACTION    . right foot fusion    . TUBAL LIGATION     Patient Active Problem List   Diagnosis Date Noted  . COPD exacerbation (HCC) 08/25/2018  . Malnutrition of moderate degree 01/02/2018  . COPD with acute exacerbation (HCC) 01/01/2018  . Anxiety 01/01/2018  . HLD (hyperlipidemia) 01/01/2018  . Tobacco use 12/06/2015      Prior to Admission medications   Medication Sig Start Date End Date Taking? Authorizing Provider  ALPRAZolam Prudy Feeler) 0.5 MG tablet Take 0.5 tablets (0.25 mg total) by mouth at bedtime as needed for anxiety or sleep. 08/29/18   Katha Hamming, MD  ANORO ELLIPTA 62.5-25 MCG/INH AEPB Inhale 1 puff into the lungs daily.  07/11/18   [provider]  aspirin 325 MG tablet Take 325 mg by mouth daily as needed for headache.    [provider]  cephALEXin (KEFLEX) 500 MG capsule Take 2 capsules (1,000 mg total) by mouth 2 (two) times daily. 05/11/20   Cuthriell, Delorise Royals, PA-C  diphenhydrAMINE (BENADRYL) 25 mg capsule Take 25 mg by mouth every 6 (six) hours as needed for itching or allergies.    [provider]  feeding supplement, ENSURE ENLIVE, (ENSURE ENLIVE) LIQD Take 237 mLs by mouth 2 (two) times daily between meals. 01/02/18   Enedina Finner, MD  FLUoxetine (PROZAC) 40 MG capsule Take 40 mg by mouth daily.     [provider]  ipratropium-albuterol (DUONEB) 0.5-2.5 (3) MG/3ML SOLN Take 3 mLs by nebulization every 2 (two) hours as needed. 01/02/18   Enedina Finner, MD  Multiple Vitamin (MULTIVITAMIN WITH MINERALS) TABS tablet Take 1 tablet by mouth daily. 08/29/18   Katha Hamming, MD  predniSONE (STERAPRED UNI-PAK 21 TAB) 10 MG (21) TBPK tablet Taper by 10 mg daily 09/06/20   Willy Eddy, MD  simvastatin (ZOCOR) 40 MG tablet Take 1 tablet by mouth at bedtime. 08/28/16   [provider]  VENTOLIN HFA 108 (90 Base) MCG/ACT inhaler Inhale 2 puffs into the lungs every 4 (four) hours as needed for wheezing. 06/03/18   [provider]    Allergies Patient has no known allergies.    Social History Social History   Tobacco Use  . Smoking status: Former Smoker  Packs/day: 1.00    Years: 42.00    Pack years: 42.00    Quit date: 12/26/2017    Years since quitting: 2.6  . Smokeless tobacco: Never Used  Substance Use Topics  . Alcohol use: Yes    Alcohol/week: 2.0 standard drinks    Types: 2 Glasses of wine per week  . Drug use: No    Review of Systems Patient denies headaches, rhinorrhea, blurry vision, numbness, shortness of breath, chest pain, edema, cough, abdominal pain, nausea, vomiting, diarrhea, dysuria, fevers, rashes or hallucinations unless otherwise stated above in HPI. ____________________________________________   PHYSICAL EXAM:  VITAL SIGNS: Vitals:   09/06/20 1300  09/06/20 1400  BP: (!) 145/76 132/86  Pulse: 87 96  Resp: 20 (!) 21  Temp:    SpO2: 91% 94%    Constitutional: Alert and oriented.  Eyes: Conjunctivae are normal.  Head: Atraumatic. Nose: No congestion/rhinnorhea. Mouth/Throat: Mucous membranes are moist.   Neck: No stridor. Painless ROM.  Cardiovascular: Normal rate, regular rhythm. Grossly normal heart sounds.  Good peripheral circulation. Respiratory: prolonged exp phase, diffuse wheeze throughout, no resp distress Gastrointestinal: Soft and nontender. No distention. No abdominal bruits. No CVA tenderness. Genitourinary:  Musculoskeletal: No lower extremity tenderness nor edema.  No joint effusions. Neurologic:  Normal speech and language. No gross focal neurologic deficits are appreciated. No facial droop Skin:  Skin is warm, dry and intact. No rash noted. Psychiatric: Mood and affect are normal. Speech and behavior are normal.  ____________________________________________   LABS (all labs ordered are listed, but only abnormal results are displayed)  Results for orders placed or performed during the hospital encounter of 09/06/20 (from the past 24 hour(s))  CBC     Status: Abnormal   Collection Time: 09/06/20 11:08 AM  Result Value Ref Range   WBC 6.7 4.0 - 10.5 K/uL   RBC 4.40 3.87 - 5.11 MIL/uL   Hemoglobin 14.1 12.0 - 15.0 g/dL   HCT 97.9 36 - 46 %   MCV 90.2 80.0 - 100.0 fL   MCH 32.0 26.0 - 34.0 pg   MCHC 35.5 30.0 - 36.0 g/dL   RDW 89.2 11.9 - 41.7 %   Platelets 146 (L) 150 - 400 K/uL   nRBC 0.0 0.0 - 0.2 %  Basic metabolic panel     Status: Abnormal   Collection Time: 09/06/20 11:08 AM  Result Value Ref Range   Sodium 134 (L) 135 - 145 mmol/L   Potassium 3.8 3.5 - 5.1 mmol/L   Chloride 99 98 - 111 mmol/L   CO2 21 (L) 22 - 32 mmol/L   Glucose, Bld 95 70 - 99 mg/dL   BUN 17 8 - 23 mg/dL   Creatinine, Ser 4.08 0.44 - 1.00 mg/dL   Calcium 8.9 8.9 - 14.4 mg/dL   GFR, Estimated >81 >85 mL/min   Anion gap 14  5 - 15  Troponin I (High Sensitivity)     Status: None   Collection Time: 09/06/20 11:08 AM  Result Value Ref Range   Troponin I (High Sensitivity) 3 <18 ng/L  Respiratory Panel by RT PCR (Flu A&B, Covid) - Nasopharyngeal Swab     Status: None   Collection Time: 09/06/20 11:22 AM   Specimen: Nasopharyngeal Swab  Result Value Ref Range   SARS Coronavirus 2 by RT PCR NEGATIVE NEGATIVE   Influenza A by PCR NEGATIVE NEGATIVE   Influenza B by PCR NEGATIVE NEGATIVE   ____________________________________________  EKG My review and personal interpretation at Time:  10:42   Indication: sob  Rate: 95  Rhythm: sinus Axis: normal Other: nonspecifc st abn likely motion artifact ____________________________________________  RADIOLOGY  I personally reviewed all radiographic images ordered to evaluate for the above acute complaints and reviewed radiology reports and findings.  These findings were personally discussed with the patient.  Please see medical record for radiology report.  ____________________________________________   PROCEDURES  Procedure(s) performed:  Procedures    Critical Care performed: no ____________________________________________   INITIAL IMPRESSION / ASSESSMENT AND PLAN / ED COURSE  Pertinent labs & imaging results that were available during my care of the patient were reviewed by me and considered in my medical decision making (see chart for details).   DDX: Asthma, copd, CHF, pna, ptx, malignancy, Pe, anemia   DELRAE HAGEY is a 74 y.o. who presents to the ED with presentation as described above.  Patient does appear short of breath with exam most consistent with acute COPD exacerbation we will give nebulizers Solu-Medrol and observe.  Have a low suspicion for CHF, ACS or PE.  Will check Covid.  Clinical Course as of Sep 06 1420  Tue Sep 06, 2020  1117 My review of chest x-ray shows no pneumothorax. Does appear to have diffuse hyperexpansion. No  infiltrates. Her presentation is most clinically consistent with COPD exacerbation.   [PR]  1403 Patient feels significantly improved now that steroids have taken him.  She is not having any hypoxia with ambulation.  She feels like she is back at her baseline.  Blood work is otherwise reassuring.   [PR]    Clinical Course User Index [PR] Willy Eddy, MD    The patient was evaluated in Emergency Department today for the symptoms described in the history of present illness. He/she was evaluated in the context of the global COVID-19 pandemic, which necessitated consideration that the patient might be at risk for infection with the SARS-CoV-2 virus that causes COVID-19. Institutional protocols and algorithms that pertain to the evaluation of patients at risk for COVID-19 are in a state of rapid change based on information released by regulatory bodies including the CDC and federal and state organizations. These policies and algorithms were followed during the patient's care in the ED.  As part of my medical decision making, I reviewed the following data within the electronic MEDICAL RECORD NUMBER Nursing notes reviewed and incorporated, Labs reviewed, notes from prior ED visits and [place] Controlled Substance Database   ____________________________________________   FINAL CLINICAL IMPRESSION(S) / ED DIAGNOSES  Final diagnoses:  COPD exacerbation (HCC)      NEW MEDICATIONS STARTED DURING THIS VISIT:  Current Discharge Medication List       Note:  This document was prepared using Dragon voice recognition software and may include unintentional dictation errors.    Willy Eddy, MD 09/06/20 1421

## 2020-09-29 ENCOUNTER — Encounter: Payer: Self-pay | Admitting: *Deleted

## 2020-09-29 ENCOUNTER — Emergency Department: Payer: Medicare PPO

## 2020-09-29 ENCOUNTER — Other Ambulatory Visit: Payer: Self-pay

## 2020-09-29 DIAGNOSIS — E222 Syndrome of inappropriate secretion of antidiuretic hormone: Secondary | ICD-10-CM | POA: Diagnosis present

## 2020-09-29 DIAGNOSIS — R0902 Hypoxemia: Secondary | ICD-10-CM | POA: Diagnosis not present

## 2020-09-29 DIAGNOSIS — J9621 Acute and chronic respiratory failure with hypoxia: Secondary | ICD-10-CM | POA: Diagnosis present

## 2020-09-29 DIAGNOSIS — Z7982 Long term (current) use of aspirin: Secondary | ICD-10-CM

## 2020-09-29 DIAGNOSIS — N390 Urinary tract infection, site not specified: Secondary | ICD-10-CM | POA: Diagnosis present

## 2020-09-29 DIAGNOSIS — Z87891 Personal history of nicotine dependence: Secondary | ICD-10-CM

## 2020-09-29 DIAGNOSIS — J441 Chronic obstructive pulmonary disease with (acute) exacerbation: Principal | ICD-10-CM | POA: Diagnosis present

## 2020-09-29 DIAGNOSIS — Z7952 Long term (current) use of systemic steroids: Secondary | ICD-10-CM

## 2020-09-29 DIAGNOSIS — Z20822 Contact with and (suspected) exposure to covid-19: Secondary | ICD-10-CM | POA: Diagnosis present

## 2020-09-29 DIAGNOSIS — E785 Hyperlipidemia, unspecified: Secondary | ICD-10-CM | POA: Diagnosis present

## 2020-09-29 DIAGNOSIS — R309 Painful micturition, unspecified: Secondary | ICD-10-CM | POA: Diagnosis present

## 2020-09-29 DIAGNOSIS — E876 Hypokalemia: Secondary | ICD-10-CM | POA: Diagnosis present

## 2020-09-29 DIAGNOSIS — Z79899 Other long term (current) drug therapy: Secondary | ICD-10-CM

## 2020-09-29 DIAGNOSIS — D696 Thrombocytopenia, unspecified: Secondary | ICD-10-CM | POA: Diagnosis present

## 2020-09-29 DIAGNOSIS — B965 Pseudomonas (aeruginosa) (mallei) (pseudomallei) as the cause of diseases classified elsewhere: Secondary | ICD-10-CM | POA: Diagnosis present

## 2020-09-29 DIAGNOSIS — F418 Other specified anxiety disorders: Secondary | ICD-10-CM | POA: Diagnosis present

## 2020-09-29 NOTE — ED Triage Notes (Signed)
EMS brings pt in from [facility] for c/o [facility]; hx COPD and anxiety; used inh & neb without relief PTA; O2 sat 93-94% on ra; currently taking antibiotic for UTI

## 2020-09-29 NOTE — ED Triage Notes (Addendum)
Pt to ED via EMS reporting SOB with a hx of COPD. Pt on 2L in triage but reports no O2 use at home. Pt is able to speak but in broken sentences. Nasal congestion without cough or fever.   Pt also reports increased anxiety related to the recent passing of her partner. Pt concerned for her safety at home feeling weaker and now without help she feels she is likely to fall. Pt uses a cane at baseline.   No chest pain or lightheadedness reported.

## 2020-09-30 ENCOUNTER — Inpatient Hospital Stay
Admission: EM | Admit: 2020-09-30 | Discharge: 2020-10-03 | DRG: 190 | Disposition: A | Discharge status: Home Health | Payer: Medicare PPO | Attending: Internal Medicine | Admitting: Internal Medicine

## 2020-09-30 DIAGNOSIS — R309 Painful micturition, unspecified: Secondary | ICD-10-CM | POA: Diagnosis present

## 2020-09-30 DIAGNOSIS — Z87891 Personal history of nicotine dependence: Secondary | ICD-10-CM | POA: Diagnosis not present

## 2020-09-30 DIAGNOSIS — J9621 Acute and chronic respiratory failure with hypoxia: Secondary | ICD-10-CM | POA: Diagnosis present

## 2020-09-30 DIAGNOSIS — F418 Other specified anxiety disorders: Secondary | ICD-10-CM

## 2020-09-30 DIAGNOSIS — J441 Chronic obstructive pulmonary disease with (acute) exacerbation: Secondary | ICD-10-CM | POA: Diagnosis present

## 2020-09-30 DIAGNOSIS — E871 Hypo-osmolality and hyponatremia: Secondary | ICD-10-CM

## 2020-09-30 DIAGNOSIS — N390 Urinary tract infection, site not specified: Secondary | ICD-10-CM | POA: Diagnosis present

## 2020-09-30 DIAGNOSIS — E785 Hyperlipidemia, unspecified: Secondary | ICD-10-CM

## 2020-09-30 DIAGNOSIS — Z720 Tobacco use: Secondary | ICD-10-CM

## 2020-09-30 DIAGNOSIS — N3000 Acute cystitis without hematuria: Secondary | ICD-10-CM | POA: Diagnosis not present

## 2020-09-30 DIAGNOSIS — Z79899 Other long term (current) drug therapy: Secondary | ICD-10-CM | POA: Diagnosis not present

## 2020-09-30 DIAGNOSIS — R0902 Hypoxemia: Secondary | ICD-10-CM

## 2020-09-30 DIAGNOSIS — E876 Hypokalemia: Secondary | ICD-10-CM

## 2020-09-30 DIAGNOSIS — B965 Pseudomonas (aeruginosa) (mallei) (pseudomallei) as the cause of diseases classified elsewhere: Secondary | ICD-10-CM | POA: Diagnosis present

## 2020-09-30 DIAGNOSIS — D696 Thrombocytopenia, unspecified: Secondary | ICD-10-CM | POA: Diagnosis present

## 2020-09-30 DIAGNOSIS — E222 Syndrome of inappropriate secretion of antidiuretic hormone: Secondary | ICD-10-CM | POA: Diagnosis present

## 2020-09-30 DIAGNOSIS — Z7952 Long term (current) use of systemic steroids: Secondary | ICD-10-CM | POA: Diagnosis not present

## 2020-09-30 DIAGNOSIS — Z7982 Long term (current) use of aspirin: Secondary | ICD-10-CM | POA: Diagnosis not present

## 2020-09-30 DIAGNOSIS — Z20822 Contact with and (suspected) exposure to covid-19: Secondary | ICD-10-CM | POA: Diagnosis present

## 2020-09-30 HISTORY — DX: Hypokalemia: E87.6

## 2020-09-30 LAB — CBC
HCT: 36 % (ref 36.0–46.0)
Hemoglobin: 13.5 g/dL (ref 12.0–15.0)
MCH: 33.7 pg (ref 26.0–34.0)
MCHC: 37.5 g/dL — ABNORMAL HIGH (ref 30.0–36.0)
MCV: 89.8 fL (ref 80.0–100.0)
Platelets: 154 10*3/uL (ref 150–400)
RBC: 4.01 MIL/uL (ref 3.87–5.11)
RDW: 12.9 % (ref 11.5–15.5)
WBC: 8.6 10*3/uL (ref 4.0–10.5)
nRBC: 0 % (ref 0.0–0.2)

## 2020-09-30 LAB — BASIC METABOLIC PANEL
Anion gap: 13 (ref 5–15)
BUN: 14 mg/dL (ref 8–23)
CO2: 22 mmol/L (ref 22–32)
Calcium: 8.8 mg/dL — ABNORMAL LOW (ref 8.9–10.3)
Chloride: 98 mmol/L (ref 98–111)
Creatinine, Ser: 0.54 mg/dL (ref 0.44–1.00)
GFR, Estimated: >60 mL/min (ref >60)
Glucose, Bld: 152 mg/dL — ABNORMAL HIGH (ref 70–99)
Potassium: 3.3 mmol/L — ABNORMAL LOW (ref 3.5–5.1)
Sodium: 133 mmol/L — ABNORMAL LOW (ref 135–145)

## 2020-09-30 LAB — RESP PANEL BY RT-PCR (FLU A&B, COVID) ARPGX2
Influenza A by PCR: NEGATIVE
Influenza B by PCR: NEGATIVE
SARS Coronavirus 2 by RT PCR: NEGATIVE

## 2020-09-30 LAB — TROPONIN I (HIGH SENSITIVITY)
Troponin I (High Sensitivity): 4 ng/L (ref <18)
Troponin I (High Sensitivity): 4 ng/L (ref <18)

## 2020-09-30 LAB — MAGNESIUM: Magnesium: 2 mg/dL (ref 1.7–2.4)

## 2020-09-30 MED ORDER — IPRATROPIUM-ALBUTEROL 0.5-2.5 (3) MG/3ML IN SOLN
3.0000 mL | RESPIRATORY_TRACT | Status: DC
  Administered 2020-09-30 – 2020-10-01 (×8): 3 mL via RESPIRATORY_TRACT
  Filled 2020-09-30 (×8): qty 3

## 2020-09-30 MED ORDER — ACETAMINOPHEN 325 MG PO TABS: 650.0000 mg | ORAL_TABLET | Freq: Four times a day (QID) | ORAL | Status: DC | PRN

## 2020-09-30 MED ORDER — ONDANSETRON HCL 4 MG/2ML IJ SOLN: 4.0000 mg | Freq: Three times a day (TID) | INTRAMUSCULAR | Status: DC | PRN

## 2020-09-30 MED ORDER — ALPRAZOLAM 0.25 MG PO TABS
0.2500 mg | ORAL_TABLET | Freq: Three times a day (TID) | ORAL | Status: DC | PRN
  Administered 2020-10-01 – 2020-10-02 (×2): 0.25 mg via ORAL
  Filled 2020-09-30 (×2): qty 1

## 2020-09-30 MED ORDER — SIMVASTATIN 20 MG PO TABS
40.0000 mg | ORAL_TABLET | Freq: Every day | ORAL | Status: DC
  Administered 2020-09-30 – 2020-10-02 (×3): 40 mg via ORAL
  Filled 2020-09-30: qty 4
  Filled 2020-09-30 (×2): qty 2

## 2020-09-30 MED ORDER — IPRATROPIUM-ALBUTEROL 0.5-2.5 (3) MG/3ML IN SOLN
3.0000 mL | Freq: Once | RESPIRATORY_TRACT | Status: AC
  Administered 2020-09-30: 3 mL via RESPIRATORY_TRACT
  Filled 2020-09-30: qty 3

## 2020-09-30 MED ORDER — POTASSIUM CHLORIDE CRYS ER 20 MEQ PO TBCR
40.0000 meq | EXTENDED_RELEASE_TABLET | Freq: Once | ORAL | Status: AC
  Administered 2020-09-30: 40 meq via ORAL
  Filled 2020-09-30: qty 2

## 2020-09-30 MED ORDER — MELATONIN 5 MG PO TABS
5.0000 mg | ORAL_TABLET | Freq: Every day | ORAL | Status: DC
  Administered 2020-09-30 – 2020-10-02 (×3): 5 mg via ORAL
  Filled 2020-09-30 (×4): qty 1

## 2020-09-30 MED ORDER — AZITHROMYCIN 500 MG PO TABS
250.0000 mg | ORAL_TABLET | Freq: Every day | ORAL | Status: DC
  Administered 2020-10-01 – 2020-10-03 (×3): 250 mg via ORAL
  Filled 2020-09-30 (×3): qty 1

## 2020-09-30 MED ORDER — ALBUTEROL SULFATE (2.5 MG/3ML) 0.083% IN NEBU: 2.5000 mg | INHALATION_SOLUTION | RESPIRATORY_TRACT | Status: DC | PRN

## 2020-09-30 MED ORDER — ENOXAPARIN SODIUM 40 MG/0.4ML ~~LOC~~ SOLN
40.0000 mg | SUBCUTANEOUS | Status: DC
  Administered 2020-09-30 – 2020-10-02 (×3): 40 mg via SUBCUTANEOUS
  Filled 2020-09-30 (×3): qty 0.4

## 2020-09-30 MED ORDER — AZITHROMYCIN 500 MG PO TABS
500.0000 mg | ORAL_TABLET | Freq: Every day | ORAL | Status: AC
  Administered 2020-09-30: 500 mg via ORAL
  Filled 2020-09-30: qty 1

## 2020-09-30 MED ORDER — METHYLPREDNISOLONE SODIUM SUCC 40 MG IJ SOLR
40.0000 mg | Freq: Two times a day (BID) | INTRAMUSCULAR | Status: DC
  Administered 2020-09-30 – 2020-10-03 (×6): 40 mg via INTRAVENOUS
  Filled 2020-09-30 (×6): qty 1

## 2020-09-30 MED ORDER — DM-GUAIFENESIN ER 30-600 MG PO TB12: 1.0000 | ORAL_TABLET | Freq: Two times a day (BID) | ORAL | Status: DC | PRN

## 2020-09-30 MED ORDER — METHYLPREDNISOLONE SODIUM SUCC 125 MG IJ SOLR
125.0000 mg | Freq: Once | INTRAMUSCULAR | Status: AC
  Administered 2020-09-30: 125 mg via INTRAVENOUS
  Filled 2020-09-30: qty 2

## 2020-09-30 MED ORDER — FLUOXETINE HCL 20 MG PO CAPS
40.0000 mg | ORAL_CAPSULE | Freq: Every day | ORAL | Status: DC
  Administered 2020-09-30 – 2020-10-02 (×3): 40 mg via ORAL
  Filled 2020-09-30 (×4): qty 2

## 2020-09-30 NOTE — Progress Notes (Signed)
Patient resting in bed with no complaints at present time. No signs of respiratory distress. Adequate oxygenation on RA. Pt states she does get short of breath with exertion, although admits this is her baseline. Will continue to monitor.

## 2020-09-30 NOTE — ED Provider Notes (Signed)
[facility] Emergency Department Provider Note   ____________________________________________    I have reviewed the triage vital signs and the nursing notes.   HISTORY  Chief Complaint Shortness of Breath     HPI Ashley Brooks is a 74 y.o. female with history of COPD who presents with complaints of shortness of breath.  Patient reports over the last 3 to 4 days shortness of breath is not worsening significantly especially with exertion.  She does feel some chest tightness and feels as though she is wheezing.  Occasional dry cough.  No fevers or chills.  Has had 2 shots of Covid vaccine.  No nausea vomiting or diaphoresis.  No calf pain or swelling.  Past Medical History:  Diagnosis Date  . Anxiety   . COPD (chronic obstructive pulmonary disease) (HCC)   . HLD (hyperlipidemia)   . Personal history of tobacco use, presenting hazards to health 12/06/2015    Patient Active Problem List   Diagnosis Date Noted  . Depression with anxiety 09/30/2020  . Acute on chronic respiratory failure with hypoxia (HCC) 09/30/2020  . UTI (urinary tract infection) 09/30/2020  . Hypokalemia 09/30/2020  . COPD exacerbation (HCC) 08/25/2018  . Malnutrition of moderate degree 01/02/2018  . COPD with acute exacerbation (HCC) 01/01/2018  . Anxiety 01/01/2018  . HLD (hyperlipidemia) 01/01/2018  . Tobacco use 12/06/2015    Past Surgical History:  Procedure Laterality Date  . CATARACT EXTRACTION    . right foot fusion    . TUBAL LIGATION      Prior to Admission medications   Medication Sig Start Date End Date Taking? Authorizing Provider  ALPRAZolam Prudy Feeler) 0.5 MG tablet Take 0.5 tablets (0.25 mg total) by mouth at bedtime as needed for anxiety or sleep. 08/29/18   Katha Hamming, MD  ANORO ELLIPTA 62.5-25 MCG/INH AEPB Inhale 1 puff into the lungs daily.  07/11/18   [provider]  aspirin 325 MG tablet Take 325 mg by mouth daily as needed for  headache.    [provider]  cephALEXin (KEFLEX) 500 MG capsule Take 2 capsules (1,000 mg total) by mouth 2 (two) times daily. 05/11/20   Cuthriell, Delorise Royals, PA-C  diphenhydrAMINE (BENADRYL) 25 mg capsule Take 25 mg by mouth every 6 (six) hours as needed for itching or allergies.    [provider]  feeding supplement, ENSURE ENLIVE, (ENSURE ENLIVE) LIQD Take 237 mLs by mouth 2 (two) times daily between meals. 01/02/18   Enedina Finner, MD  FLUoxetine (PROZAC) 40 MG capsule Take 40 mg by mouth daily.     [provider]  ipratropium-albuterol (DUONEB) 0.5-2.5 (3) MG/3ML SOLN Take 3 mLs by nebulization every 2 (two) hours as needed. 01/02/18   Enedina Finner, MD  Multiple Vitamin (MULTIVITAMIN WITH MINERALS) TABS tablet Take 1 tablet by mouth daily. 08/29/18   Katha Hamming, MD  predniSONE (STERAPRED UNI-PAK 21 TAB) 10 MG (21) TBPK tablet Taper by 10 mg daily 09/06/20   Willy Eddy, MD  simvastatin (ZOCOR) 40 MG tablet Take 1 tablet by mouth at bedtime. 08/28/16   [provider]  VENTOLIN HFA 108 (90 Base) MCG/ACT inhaler Inhale 2 puffs into the lungs every 4 (four) hours as needed for wheezing. 06/03/18   [provider]     Allergies Patient has no known allergies.  Family History  Problem Relation Age of Onset  . Colon cancer Mother   . Breast cancer Neg Hx     Social History Social  History   Tobacco Use  . Smoking status: Former Smoker    Packs/day: 1.00    Years: 42.00    Pack years: 42.00    Quit date: 12/26/2017    Years since quitting: 2.7  . Smokeless tobacco: Never Used  Substance Use Topics  . Alcohol use: Yes    Alcohol/week: 2.0 standard drinks    Types: 2 Glasses of wine per week  . Drug use: No    Review of Systems  Constitutional: No fever/chills Eyes: No visual changes.  ENT: No sore throat. Cardiovascular: Denies chest pain. Respiratory: As above Gastrointestinal: No abdominal pain.  No nausea, no vomiting.    Genitourinary: Negative for dysuria. Musculoskeletal: Negative for back pain. Skin: Negative for rash. Neurological: Negative for headaches or weakness   ____________________________________________   PHYSICAL EXAM:  VITAL SIGNS: ED Triage Vitals  Enc Vitals Group     BP 09/29/20 2342 (!) 142/75     Pulse Rate 09/29/20 2342 (!) 103     Resp 09/29/20 2342 (!) 22     Temp 09/29/20 2342 98.6 F (37 C)     Temp Source 09/29/20 2342 Oral     SpO2 09/29/20 2338 93 %     Weight 09/29/20 2343 54.4 kg (119 lb 14.9 oz)     Height 09/29/20 2343 1.524 m (5')     Head Circumference --      Peak Flow --      Pain Score 09/29/20 2343 0     Pain Loc --      Pain Edu? --      Excl. in GC? --     Constitutional: Alert and oriented.  Eyes: Conjunctivae are normal.  Head: Atraumatic. Nose: No congestion/rhinnorhea. Mouth/Throat: Mucous membranes are moist.   Neck:  Painless ROM Cardiovascular: Normal rate, regular rhythm. Grossly normal heart sounds.  Good peripheral circulation. Respiratory: Mild tachypnea no retractions.  Scattered wheezing Gastrointestinal: Soft and nontender. No distention.    Musculoskeletal: No lower extremity tenderness nor edema.  Warm and well perfused Neurologic:  Normal speech and language. No gross focal neurologic deficits are appreciated.  Skin:  Skin is warm, dry and intact. No rash noted. Psychiatric: Mood and affect are normal. Speech and behavior are normal.  ____________________________________________   LABS (all labs ordered are listed, but only abnormal results are displayed)  Labs Reviewed  CBC - Abnormal; Notable for the following components:      Result Value   MCHC 37.5 (*)    All other components within normal limits  BASIC METABOLIC PANEL - Abnormal; Notable for the following components:   Sodium 133 (*)    Potassium 3.3 (*)    Glucose, Bld 152 (*)    Calcium 8.8 (*)    All other components within normal limits  RESP PANEL BY  RT-PCR (FLU A&B, COVID) ARPGX2  MAGNESIUM  TROPONIN I (HIGH SENSITIVITY)  TROPONIN I (HIGH SENSITIVITY)   ____________________________________________  EKG  ED ECG REPORT I, Jene Every, the attending physician, personally viewed and interpreted this ECG.  Date: 09/30/2020  Rhythm: Sinus tachycardia QRS Axis: normal Intervals: normal ST/T Wave abnormalities: Nonspecific changes Narrative Interpretation: no evidence of acute ischemia  ____________________________________________  RADIOLOGY  Chest x-ray reviewed by me, no infiltrate or effusion ____________________________________________   PROCEDURES  Procedure(s) performed: No  Procedures   Critical Care performed: No ____________________________________________   INITIAL IMPRESSION / ASSESSMENT AND PLAN / ED COURSE  Pertinent labs & imaging results that were available during  my care of the patient were reviewed by me and considered in my medical decision making (see chart for details).  Patient presents with worsening shortness of breath in the setting of COPD, scattered wheezing on exam, mild tachypnea.  New oxygen requirement, she is on 2 L nasal cannula to keep her saturations above 90%.  Did have some chest tightness, now improved, no pleurisy, no calf pain or swelling.  No fevers chills or cough.  EKG is overall reassuring, delta troponin unchanged.  Mild hyponatremia but otherwise reassuring labs.  Chest x-ray without infiltrate or effusion.  Patient treated with DuoNeb's and Solu-Medrol  Given new oxygen requirement will require admission    ____________________________________________   FINAL CLINICAL IMPRESSION(S) / ED DIAGNOSES  Final diagnoses:  COPD exacerbation (HCC)  Hypoxia        Note:  This document was prepared using Dragon voice recognition software and may include unintentional dictation errors.   Jene Every, MD 09/30/20 [phone]

## 2020-09-30 NOTE — Progress Notes (Signed)
   09/30/20 1010  Clinical Encounter Type  Visited With Patient  Visit Type Initial  Referral From Nurse  Consult/Referral To Chaplain  Chaplain responded to a pg from ED saying Pt requested to speak with a chaplain. When chaplain arrived and asked Pt what she could so for her, Pt said, listen. Chaplain sat with Pt for almost 1 hour, listening. Pt said her father died 11 yrs ago on Thanksgiving and on last Friday, the day after Thanksgiving, her boyfriend of 8 yrs died. Pt is feeling anxious, overwhelmed and sad. But by the time chaplain left the Pt she was looking and sounding better. Pt continually thanked chaplain for coming and chaplain told her that it was pleasure. Pt doesn't have any family, but she has a friend Aggie Cosier is going to her room to pick up clothing and phone (once Pt finds out what is going on with her). Chaplain asked Pt if she could pray with her and she said yes. Pt and chaplain held hands as chaplain prayed concerning the things Pt shared with her. Chaplain wished Pt well and told her if she needs anything to have chaplain paged.

## 2020-09-30 NOTE — H&P (Signed)
History and Physical    Ashley Brooks:[id number] DOB: 04/18/1946 DOA: 09/30/2020  Referring MD/NP/PA:   PCP: Dorothey Baseman, MD   Patient coming from:  The patient is coming from [facility].  At baseline, pt is independent for most of ADL.        Chief Complaint: SOB  HPI: Ashley Brooks is a 74 y.o. female with medical history significant of COPD, hyperlipidemia, depression, anxiety, former tobacco abuse, who presents with shortness of breath.  Patient states that she has been having shortness breath in the past 4 days, which has been progressively worsening.  Patient has cough with little mucus production.  She has chest tightness, but denies active chest pain.  She has wheezing.  Patient is speaking in broken sentences.  She denies nausea, vomiting, diarrhea or abdominal pain.  Patient states that she was recently treated with antibiotics for UTI for 5 days.  She does not remember the name of antibiotics. She still has mild dysuria and burning on urination, no urinary frequency.  She has generalized weakness, no unilateral numbness or tingling in extremities, no facial droop or slurred speech.  Patient was found to have oxygen desaturation to 88% on room air, which improved to 95-100% on 2 L oxygen.   ED Course: pt was found to have WBC 8.6, troponin level 4, 4, negative Covid PCR, potassium 3.3, renal function okay, pending urinalysis, temperature normal, blood pressure 130/77, heart rate 103, 98, RR 26.  Chest x-ray showed COPD without infiltration.  Patient is admitted to progressive bed as inpatient.  Review of Systems:   General: no fevers, chills, no body weight gain, has fatigue HEENT: no blurry vision, hearing changes or sore throat Respiratory: has dyspnea, coughing, wheezing CV: has chest tightness, no palpitations GI: no nausea, vomiting, abdominal pain, diarrhea, constipation GU: has dysuria, burning on urination, no increased urinary frequency, hematuria  Ext: no  leg edema Neuro: no unilateral weakness, numbness, or tingling, no vision change or hearing loss Skin: no rash, no skin tear. MSK: No muscle spasm, no deformity, no limitation of range of movement in spin Heme: No easy bruising.  Travel history: No recent long distant travel.  Allergy: No Known Allergies  Past Medical History:  Diagnosis Date  . Anxiety   . COPD (chronic obstructive pulmonary disease) (HCC)   . HLD (hyperlipidemia)   . Personal history of tobacco use, presenting hazards to health 12/06/2015    Past Surgical History:  Procedure Laterality Date  . CATARACT EXTRACTION    . right foot fusion    . TUBAL LIGATION      Social History:  reports that she quit smoking about 2 years ago. She has a 42.00 pack-year smoking history. She has never used smokeless tobacco. She reports current alcohol use of about 2.0 standard drinks of alcohol per week. She reports that she does not use drugs.  Family History:  Family History  Problem Relation Age of Onset  . Colon cancer Mother   . Breast cancer Neg Hx      Prior to Admission medications   Medication Sig Start Date End Date Taking? Authorizing Provider  ALPRAZolam Prudy Feeler) 0.5 MG tablet Take 0.5 tablets (0.25 mg total) by mouth at bedtime as needed for anxiety or sleep. 08/29/18   Katha Hamming, MD  ANORO ELLIPTA 62.5-25 MCG/INH AEPB Inhale 1 puff into the lungs daily.  07/11/18   [provider]  aspirin 325 MG tablet Take 325 mg by mouth daily as needed  for headache.    [provider]  cephALEXin (KEFLEX) 500 MG capsule Take 2 capsules (1,000 mg total) by mouth 2 (two) times daily. 05/11/20   Cuthriell, Delorise Royals, PA-C  diphenhydrAMINE (BENADRYL) 25 mg capsule Take 25 mg by mouth every 6 (six) hours as needed for itching or allergies.    [provider]  feeding supplement, ENSURE ENLIVE, (ENSURE ENLIVE) LIQD Take 237 mLs by mouth 2 (two) times daily between meals. 01/02/18   Enedina Finner, MD   FLUoxetine (PROZAC) 40 MG capsule Take 40 mg by mouth daily.     [provider]  ipratropium-albuterol (DUONEB) 0.5-2.5 (3) MG/3ML SOLN Take 3 mLs by nebulization every 2 (two) hours as needed. 01/02/18   Enedina Finner, MD  Multiple Vitamin (MULTIVITAMIN WITH MINERALS) TABS tablet Take 1 tablet by mouth daily. 08/29/18   Katha Hamming, MD  predniSONE (STERAPRED UNI-PAK 21 TAB) 10 MG (21) TBPK tablet Taper by 10 mg daily 09/06/20   Willy Eddy, MD  simvastatin (ZOCOR) 40 MG tablet Take 1 tablet by mouth at bedtime. 08/28/16   [provider]  VENTOLIN HFA 108 (90 Base) MCG/ACT inhaler Inhale 2 puffs into the lungs every 4 (four) hours as needed for wheezing. 06/03/18   [provider]    Physical Exam: Vitals:   09/30/20 1230 09/30/20 1251 09/30/20 1600 09/30/20 1700  BP: 120/75 127/81 115/80   Pulse: (!) 108 72 99   Resp:  18 (!) 24   Temp:  98 F (36.7 C) 98 F (36.7 C)   TempSrc:   Oral   SpO2: 92% 99% 93% 98%  Weight:      Height:       General: Not in acute distress HEENT:       Eyes: PERRL, EOMI, no scleral icterus.       ENT: No discharge from the ears and nose, no pharynx injection, no tonsillar enlargement.        Neck: No JVD, no bruit, no mass felt. Heme: No neck lymph node enlargement. Cardiac: S1/S2, RRR, No murmurs, No gallops or rubs. Respiratory: Has decreased air movement bilaterally.  Has wheezing bilaterally. GI: Soft, nondistended, nontender, no rebound pain, no organomegaly, BS present. GU: No hematuria Ext: No pitting leg edema bilaterally. 1+DP/PT pulse bilaterally. Musculoskeletal: No joint deformities, No joint redness or warmth, no limitation of ROM in spin. Skin: No rashes.  Neuro: Alert, oriented X3, cranial nerves II-XII grossly intact, moves all extremities normally. Psych: Patient is not psychotic, no suicidal or hemocidal ideation.  Labs on Admission: I have personally reviewed following labs and imaging  studies  CBC: Recent Labs  Lab 09/29/20 2346  WBC 8.6  HGB 13.5  HCT 36.0  MCV 89.8  PLT 154   Basic Metabolic Panel: Recent Labs  Lab 09/29/20 1246 09/29/20 2346  NA  --  133*  K  --  3.3*  CL  --  98  CO2  --  22  GLUCOSE  --  152*  BUN  --  14  CREATININE  --  0.54  CALCIUM  --  8.8*  MG 2.0  --    GFR: Estimated Creatinine Clearance: 44.3 mL/min (by C-G formula based on SCr of 0.54 mg/dL). Liver Function Tests: No results for input(s): AST, ALT, ALKPHOS, BILITOT, PROT, ALBUMIN in the last 168 hours. No results for input(s): LIPASE, AMYLASE in the last 168 hours. No results for input(s): AMMONIA in the last 168 hours. Coagulation Profile: No results for  input(s): INR, PROTIME in the last 168 hours. Cardiac Enzymes: No results for input(s): CKTOTAL, CKMB, CKMBINDEX, TROPONINI in the last 168 hours. BNP (last 3 results) No results for input(s): PROBNP in the last 8760 hours. HbA1C: No results for input(s): HGBA1C in the last 72 hours. CBG: No results for input(s): GLUCAP in the last 168 hours. Lipid Profile: No results for input(s): CHOL, HDL, LDLCALC, TRIG, CHOLHDL, LDLDIRECT in the last 72 hours. Thyroid Function Tests: No results for input(s): TSH, T4TOTAL, FREET4, T3FREE, THYROIDAB in the last 72 hours. Anemia Panel: No results for input(s): VITAMINB12, FOLATE, FERRITIN, TIBC, IRON, RETICCTPCT in the last 72 hours. Urine analysis: No results found for: COLORURINE, APPEARANCEUR, LABSPEC, PHURINE, GLUCOSEU, HGBUR, BILIRUBINUR, KETONESUR, PROTEINUR, UROBILINOGEN, NITRITE, LEUKOCYTESUR Sepsis Labs: @LABRCNTIP (procalcitonin:4,lacticidven:4) ) Recent Results (from the past 240 hour(s))  Resp Panel by RT-PCR (Flu A&B, Covid) Nasopharyngeal Swab     Status: None   Collection Time: 09/30/20  7:29 AM   Specimen: Nasopharyngeal Swab; Nasopharyngeal(NP) swabs in vial transport medium  Result Value Ref Range Status   SARS Coronavirus 2 by RT PCR NEGATIVE NEGATIVE  Final    Comment: (NOTE) SARS-CoV-2 target nucleic acids are NOT DETECTED.  The SARS-CoV-2 RNA is generally detectable in upper respiratory specimens during the acute phase of infection. The lowest concentration of SARS-CoV-2 viral copies this assay can detect is 138 copies/mL. A negative result does not preclude SARS-Cov-2 infection and should not be used as the sole basis for treatment or other patient management decisions. A negative result may occur with  improper specimen collection/handling, submission of specimen other than nasopharyngeal swab, presence of viral mutation(s) within the areas targeted by this assay, and inadequate number of viral copies(<138 copies/mL). A negative result must be combined with clinical observations, patient history, and epidemiological information. The expected result is Negative.  Fact Sheet for Patients:  14/03/21  Fact Sheet for Healthcare Providers:  BloggerCourse.com  This test is no t yet approved or cleared by the [employer] FDA and  has been authorized for detection and/or diagnosis of SARS-CoV-2 by FDA under an Emergency Use Authorization (EUA). This EUA will remain  in effect (meaning this test can be used) for the duration of the COVID-19 declaration under Section 564(b)(1) of the Act, 21 U.S.C.section 360bbb-3(b)(1), unless the authorization is terminated  or revoked sooner.       Influenza A by PCR NEGATIVE NEGATIVE Final   Influenza B by PCR NEGATIVE NEGATIVE Final    Comment: (NOTE) The Xpert Xpress SARS-CoV-2/FLU/RSV plus assay is intended as an aid in the diagnosis of influenza from Nasopharyngeal swab specimens and should not be used as a sole basis for treatment. Nasal washings and aspirates are unacceptable for Xpert Xpress SARS-CoV-2/FLU/RSV testing.  Fact Sheet for Patients: [place]  Fact Sheet for Healthcare  Providers: BloggerCourse.com  This test is not yet approved or cleared by the [employer]ious[employer].[place] FDA and has been authorized for detection and/or diagnosis of SARS-CoV-2 by FDA under an Emergency Use Authorization (EUA). This EUA will remain in effect (meaning this test can be used) for the duration of the COVID-19 declaration under Section 564(b)(1) of the Act, 21 U.S.C. section 360bbb-3(b)(1), unless the authorization is terminated or revoked.  Performed at [facility], [address] [place], [place]      Radiological Exams on Admission: DG Chest 2 View  Result Date: 09/30/2020 CLINICAL DATA:  Dyspnea EXAM: CHEST - 2 VIEW COMPARISON:  09/06/2020 FINDINGS: The lungs are hyperinflated in keeping  with changes of underlying COPD. Mild left-sided volume loss is unchanged. The lungs are clear. No pneumothorax or pleural effusion. Cardiac size is within normal limits. Pulmonary vascularity is normal. No acute bone abnormality. IMPRESSION: No active cardiopulmonary disease.  COPD Electronically Signed   By: Helyn NumbersAshesh  Parikh MD   On: 09/30/2020 00:31     EKG: I have personally reviewed.  Sinus rhythm, tachycardia, QTC 471, nonspecific T wave change  Assessment/Plan Principal Problem:   COPD exacerbation (HCC) Active Problems:   HLD (hyperlipidemia)   Depression with anxiety   Acute on chronic respiratory failure with hypoxia (HCC)   UTI (urinary tract infection)   Hypokalemia   Acute on chronic respiratory failure with hypoxia due to COPD exacerbation (HCC):  - will admit to progressive unit as inpatient -Bronchodilators -Solu-Medrol 40 mg IV bid -Z pak  -Mucinex for cough  -Incentive spirometry -Follow up sputum culture -Nasal cannula oxygen as needed to maintain O2 saturation 93% or greater  HLD (hyperlipidemia) -zocor  Depression with anxiety -Continue home medications: Xanax and Prozac  Hypokalemia: K= 3.3  on admission. -  Repleted - Check Mg level - Give 1 g of magnesium sulfate  Recently treated UTI (urinary tract infection): Patient completed course of nitrofurantoin, but still has some symptoms -Follow-up urinalysis     DVT ppx:  SQ Lovenox Code Status: Full code (patient had partial code before, but she wants to be full code today) Family Communication: not done, no family member is at bed side.  I have tried to call her friend without success. Disposition Plan:  Anticipate discharge back to previous environment Consults called: None Admission status:   progressive unit as inpt   Status is: Inpatient  Remains inpatient appropriate because:Inpatient level of care appropriate due to severity of illness.  Patient has multiple comorbidities, now presents with acute on chronic respiratory failure with hypoxia due to COPD exacerbation.  Patient has new oxygen requirement.  Her presentation is highly complicated.  Patient is at high risk of deteriorating.  Will need to be treated in the hospital for at least 2 days.   Dispo: The patient is from: Home              Anticipated d/c is to: Home              Anticipated d/c date is: 2 days              Patient currently is not medically stable to d/c.           Date of Service 09/30/2020    Lorretta HarpXilin Marinus Eicher [facility]   If 7PM-7AM, please contact night-coverage [email or web address] 09/30/2020, 6:23 PM

## 2020-09-30 NOTE — ED Notes (Signed)
Patient denies pain and is resting comfortably.  

## 2020-10-01 ENCOUNTER — Encounter: Payer: Self-pay | Admitting: Internal Medicine

## 2020-10-01 DIAGNOSIS — J9621 Acute and chronic respiratory failure with hypoxia: Secondary | ICD-10-CM

## 2020-10-01 DIAGNOSIS — J441 Chronic obstructive pulmonary disease with (acute) exacerbation: Principal | ICD-10-CM

## 2020-10-01 DIAGNOSIS — E871 Hypo-osmolality and hyponatremia: Secondary | ICD-10-CM | POA: Diagnosis not present

## 2020-10-01 LAB — URINALYSIS, COMPLETE (UACMP) WITH MICROSCOPIC
Bilirubin Urine: NEGATIVE
Glucose, UA: 150 mg/dL — AB
Ketones, ur: 20 mg/dL — AB
Nitrite: NEGATIVE
Protein, ur: 30 mg/dL — AB
Specific Gravity, Urine: 1.026 (ref 1.005–1.030)
WBC, UA: >50 WBC/hpf — ABNORMAL HIGH (ref 0–5)
pH: 6 (ref 5.0–8.0)

## 2020-10-01 LAB — CBC
HCT: 35.7 % — ABNORMAL LOW (ref 36.0–46.0)
Hemoglobin: 12.6 g/dL (ref 12.0–15.0)
MCH: 32.1 pg (ref 26.0–34.0)
MCHC: 35.3 g/dL (ref 30.0–36.0)
MCV: 90.8 fL (ref 80.0–100.0)
Platelets: 139 10*3/uL — ABNORMAL LOW (ref 150–400)
RBC: 3.93 MIL/uL (ref 3.87–5.11)
RDW: 12.8 % (ref 11.5–15.5)
WBC: 6 10*3/uL (ref 4.0–10.5)
nRBC: 0 % (ref 0.0–0.2)

## 2020-10-01 LAB — BASIC METABOLIC PANEL
Anion gap: 9 (ref 5–15)
BUN: 21 mg/dL (ref 8–23)
CO2: 25 mmol/L (ref 22–32)
Calcium: 9 mg/dL (ref 8.9–10.3)
Chloride: 96 mmol/L — ABNORMAL LOW (ref 98–111)
Creatinine, Ser: 0.56 mg/dL (ref 0.44–1.00)
GFR, Estimated: >60 mL/min (ref >60)
Glucose, Bld: 157 mg/dL — ABNORMAL HIGH (ref 70–99)
Potassium: 3.9 mmol/L (ref 3.5–5.1)
Sodium: 130 mmol/L — ABNORMAL LOW (ref 135–145)

## 2020-10-01 MED ORDER — IPRATROPIUM-ALBUTEROL 0.5-2.5 (3) MG/3ML IN SOLN
RESPIRATORY_TRACT | Status: AC
  Filled 2020-10-01: qty 3

## 2020-10-01 NOTE — Evaluation (Signed)
Occupational Therapy Evaluation Patient Details Name: ANAMIKA KUEKER MRN: [id number] DOB: 1945-10-31 Today's Date: 10/01/2020    History of Present Illness NOVIA LANSBERRY is a 74 y.o. female with medical history significant of COPD, hyperlipidemia, depression, anxiety, former tobacco abuse, who presents with shortness of breath and generalized weakness. She reports SOB x 4 days, which has been progressively worsening. Patient has cough with little mucus production, wheezing, chest tightness, but denies active chest pain. She denies nausea, vomiting, diarrhea, or abdominal pain. Patient reports was recent treatment with antibiotics for UTI.   Clinical Impression   Pt was seen for OT evaluation this date. Prior to hospital admission, pt was living in a motel with her SO while construction was being done on their home. Pt was Mod I in most ADL/IADL, ambulates with a RW, does not drive 2/2 vision impairments. Given the recent death of her significant other, pt is now scheduled to move this week into [facility]. This facility will provide 3 meals/day, offer transportation services, and limited assistance in the home. Pt currently appears thin, weak, and subdued. She is able to perform bed mobility INDly but requires increased time. Standing balance poor, pt shaky, requests to sit down after < 1 min standing. Pt O2 sats 94% on room air in bed, dropping to mid 80s with OOB movement, w/ HR increasing to 115 with mild exertional activity. Pt would benefit from ongoing OT while hospitalized, to address weakness, limited endurance, poor balance and to increase her ability to engage in functional mobility and ADL tasks. Post DC, pt would benefit from [facility] to maximize home safety, decrease fall risk, and increase independence. This is particularly important at this time, given that pt will be discharging to a new home and is uncertainly of ability to navigate safely in this new environment.     Follow Up  Recommendations  Home health OT    Equipment Recommendations       Recommendations for Other Services       Precautions / Restrictions Precautions Precautions: Fall Restrictions Weight Bearing Restrictions: No      Mobility Bed Mobility Overal bed mobility: Modified Independent             General bed mobility comments: increased time    Transfers Overall transfer level: Needs assistance Equipment used: Rolling walker (2 wheeled) Transfers: Sit to/from Stand Sit to Stand: Min assist              Balance Overall balance assessment: Needs assistance Sitting-balance support: Feet unsupported;Bilateral upper extremity supported Sitting balance-Leahy Scale: Fair     Standing balance support: Single extremity supported Standing balance-Leahy Scale: Poor                             ADL either performed or assessed with clinical judgement   ADL Overall ADL's : Needs assistance/impaired Eating/Feeding: Independent   Grooming: Minimal assistance Grooming Details (indicate cue type and reason): VCs                             Functional mobility during ADLs: Moderate assistance General ADL Comments: appears tired, worn out     Vision Baseline Vision/History: Macular Degeneration Patient Visual Report: No change from baseline Additional Comments: reports vision is "murky"     Perception     Praxis      Pertinent Vitals/Pain Pain Assessment: No/denies pain  Hand Dominance     Extremity/Trunk Assessment Upper Extremity Assessment Upper Extremity Assessment: Generalized weakness   Lower Extremity Assessment Lower Extremity Assessment: Generalized weakness       Communication Communication Communication: No difficulties   Cognition Arousal/Alertness: Awake/alert Behavior During Therapy: WFL for tasks assessed/performed Overall Cognitive Status: Within Functional Limits for tasks assessed                                  General Comments: Her significant other died last week; pt reports feeling "grief stricken" and anxious   General Comments  Pt reports feeling "very lonely" and "isolated"    Exercises Other Exercises Other Exercises: educ re: role of OT, POC, DC recs, EC, deep breathing. Bed mobility, transfers, balance.   Shoulder Instructions      Home Living Family/patient expects to be discharged to:: Other (Comment) (Motel)                                 Additional Comments: Pt has been living in a motel, while [employer] work was being completed on her home. She is scheduled this week to move into [facility].      Prior Functioning/Environment Level of Independence: Needs assistance  Gait / Transfers Assistance Needed: Uses RW. Reports 4-5 falls in previous 6 months. ADL's / Homemaking Assistance Needed: Does not drive, does not cook -- mostly orders food delivery. Finds showering difficulty, 2/2 steam exacerbating her COPD.            OT Problem List: Decreased strength;Impaired balance (sitting and/or standing);Cardiopulmonary status limiting activity;Impaired vision/perception;Decreased activity tolerance;Decreased coordination      OT Treatment/Interventions: Self-care/ADL training;DME and/or AE instruction;Therapeutic activities;Therapeutic exercise;Energy conservation;Patient/family education    OT Goals(Current goals can be found in the care plan section) Acute Rehab OT Goals Patient Stated Goal: "to get into my new home" OT Goal Formulation: With patient Time For Goal Achievement: 10/15/20 Potential to Achieve Goals: Good  OT Frequency: Min 1X/week   Barriers to D/C: Decreased caregiver support          Co-evaluation              AM-PAC OT "6 Clicks" Daily Activity     Outcome Measure Help from another person eating meals?: None Help from another person taking care of personal grooming?: A Little Help from another person  toileting, which includes using toliet, bedpan, or urinal?: A Lot Help from another person bathing (including washing, rinsing, drying)?: A Little Help from another person to put on and taking off regular upper body clothing?: A Little Help from another person to put on and taking off regular lower body clothing?: A Lot 6 Click Score: 17   End of Session Equipment Utilized During Treatment: Rolling walker  Activity Tolerance: Patient tolerated treatment well Patient left: in bed;with call bell/phone within reach  OT Visit Diagnosis: Unsteadiness on feet (R26.81);Muscle weakness (generalized) (M62.81);History of falling (Z91.81)                Time: 8756-4332 OT Time Calculation (min): 25 min Charges:  OT General Charges $OT Visit: 1 Visit OT Evaluation $OT Eval Moderate Complexity: 1 Mod OT Treatments $Self Care/Home Management : 23-37 mins  Latina Craver, PhD, MS, OTR/L ascom [phone] 10/01/20, 2:58 PM

## 2020-10-01 NOTE — ED Notes (Signed)
Spoke to nurse Tammy about Bed Ready. 

## 2020-10-01 NOTE — Progress Notes (Addendum)
PT Cancellation Note  Patient Details Name: Ashley Brooks MRN: [id number] DOB: [birth year]   Cancelled Treatment:    Reason Eval/Treat Not Completed: Patient declined, no reason specified.  After chart-review and attempting to find a walker for pt to utilize, pt declined therapy evaluation.  Pt notes that her breathing would become difficult with any form of exercise.  Pt encouraged, but ultimately refused.  Will re-attempt at later time/date.   Nolon Bussing, PT, DPT 10/01/20, 2:58 PM

## 2020-10-01 NOTE — Progress Notes (Signed)
PROGRESS NOTE    Ashley Brooks  NFA:[id number] DOB: 10/30/1945 DOA: 09/30/2020 PCP: Dorothey Baseman, MD   Chief complaint shortness of breath Brief Narrative:  Ashley Brooks is a 74 y.o. female with medical history significant of COPD, hyperlipidemia, depression, anxiety, former tobacco abuse, who presents with shortness of breath. Patient boyfriend passed away in her own home recently, since then, she has been having worsening short of breath and wheezing.  She has no cough.  She recently completed 5 days of antibiotics for UTI. She diagnosed with COPD/sedation, was placed on steroids.   Assessment & Plan:   Principal Problem:   COPD exacerbation (HCC) Active Problems:   HLD (hyperlipidemia)   Depression with anxiety   Acute on chronic respiratory failure with hypoxia (HCC)   UTI (urinary tract infection)   Hypokalemia  #1.  Acute hypoxemic respiratory failure secondary to COPD exacerbation. COPD exacerbation. Patient feels better today, she is off oxygen today.  She still has significant short of breath.  Continue Solu-Medrol. Incentive spirometer.  2.  Recent UTI. Patient still has some dysuria.  Recheck a UA and urine culture.  3.  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  Follow.      DVT prophylaxis: Lovenox Code Status: Full Family Communication: None Disposition Plan:  .   Status is: Inpatient  Remains inpatient appropriate because:Inpatient level of care appropriate due to severity of illness   Dispo: The patient is from: Home              Anticipated d/c is to: Home              Anticipated d/c date is: 1 day              Patient currently is not medically stable to d/c.        No intake/output data recorded. No intake/output data recorded.     Consultants:   None  Procedures: None  Antimicrobials: None  Subjective: Patient still has significant short of breath and wheezing with exertion.  She denies any paroxysmal nocturnal  dyspnea, he does sleep flat at nighttime. She has a cough, nonproductive. She has no fever or chills. No abdominal pain nausea vomiting.  No diarrhea constipation. No headache or dizziness. No chest pain or palpitation. She has significant anxiety  Objective: Vitals:   10/01/20 0602 10/01/20 0800 10/01/20 1000 10/01/20 1200  BP: 106/67 104/69 118/77 111/74  Pulse: 89 85 (!) 103 100  Resp: 17 19 (!) 23 20  Temp:      TempSrc:      SpO2: 92% 92% 90% 93%  Weight:      Height:       No intake or output data in the 24 hours ending 10/01/20 1409 Filed Weights   09/29/20 2343  Weight: 54.4 kg    Examination:  General exam: Appears calm and comfortable  Respiratory system: Significant decreased breathing sounds with very little air movements. Respiratory effort normal. Cardiovascular system: S1 & S2 heard, RRR. No JVD, murmurs, rubs, gallops or clicks. No pedal edema. Gastrointestinal system: Abdomen is nondistended, soft and nontender. No organomegaly or masses felt. Normal bowel sounds heard. Central nervous system: Alert and oriented. No focal neurological deficits. Extremities: Symmetric  Skin: No rashes, lesions or ulcers Psychiatry:  Mood & affect appropriate.     Data Reviewed: I have personally reviewed following labs and imaging studies  CBC: Recent Labs  Lab 09/29/20 2346 10/01/20 0500  WBC 8.6 6.0  HGB 13.5 12.6  HCT 36.0 35.7*  MCV 89.8 90.8  PLT 154 139*   Basic Metabolic Panel: Recent Labs  Lab 09/29/20 1246 09/29/20 2346 10/01/20 0500  NA  --  133* 130*  K  --  3.3* 3.9  CL  --  98 96*  CO2  --  22 25  GLUCOSE  --  152* 157*  BUN  --  14 21  CREATININE  --  0.54 0.56  CALCIUM  --  8.8* 9.0  MG 2.0  --   --    GFR: Estimated Creatinine Clearance: 44.3 mL/min (by C-G formula based on SCr of 0.56 mg/dL). Liver Function Tests: No results for input(s): AST, ALT, ALKPHOS, BILITOT, PROT, ALBUMIN in the last 168 hours. No results for input(s):  LIPASE, AMYLASE in the last 168 hours. No results for input(s): AMMONIA in the last 168 hours. Coagulation Profile: No results for input(s): INR, PROTIME in the last 168 hours. Cardiac Enzymes: No results for input(s): CKTOTAL, CKMB, CKMBINDEX, TROPONINI in the last 168 hours. BNP (last 3 results) No results for input(s): PROBNP in the last 8760 hours. HbA1C: No results for input(s): HGBA1C in the last 72 hours. CBG: No results for input(s): GLUCAP in the last 168 hours. Lipid Profile: No results for input(s): CHOL, HDL, LDLCALC, TRIG, CHOLHDL, LDLDIRECT in the last 72 hours. Thyroid Function Tests: No results for input(s): TSH, T4TOTAL, FREET4, T3FREE, THYROIDAB in the last 72 hours. Anemia Panel: No results for input(s): VITAMINB12, FOLATE, FERRITIN, TIBC, IRON, RETICCTPCT in the last 72 hours. Sepsis Labs: No results for input(s): PROCALCITON, LATICACIDVEN in the last 168 hours.  Recent Results (from the past 240 hour(s))  Resp Panel by RT-PCR (Flu A&B, Covid) Nasopharyngeal Swab     Status: None   Collection Time: 09/30/20  7:29 AM   Specimen: Nasopharyngeal Swab; Nasopharyngeal(NP) swabs in vial transport medium  Result Value Ref Range Status   SARS Coronavirus 2 by RT PCR NEGATIVE NEGATIVE Final    Comment: (NOTE) SARS-CoV-2 target nucleic acids are NOT DETECTED.  The SARS-CoV-2 RNA is generally detectable in upper respiratory specimens during the acute phase of infection. The lowest concentration of SARS-CoV-2 viral copies this assay can detect is 138 copies/mL. A negative result does not preclude SARS-Cov-2 infection and should not be used as the sole basis for treatment or other patient management decisions. A negative result may occur with  improper specimen collection/handling, submission of specimen other than nasopharyngeal swab, presence of viral mutation(s) within the areas targeted by this assay, and inadequate number of viral copies(<138 copies/mL). A negative  result must be combined with clinical observations, patient history, and epidemiological information. The expected result is Negative.  Fact Sheet for Patients:  BloggerCourse.com  Fact Sheet for Healthcare Providers:  SeriousBroker.it  This test is no t yet approved or cleared by the [place] FDA and  has been authorized for detection and/or diagnosis of SARS-CoV-2 by FDA under an Emergency Use Authorization (EUA). This EUA will remain  in effect (meaning this test can be used) for the duration of the COVID-19 declaration under Section 564(b)(1) of the Act, 21 U.S.C.section 360bbb-3(b)(1), unless the authorization is terminated  or revoked sooner.       Influenza A by PCR NEGATIVE NEGATIVE Final   Influenza B by PCR NEGATIVE NEGATIVE Final    Comment: (NOTE) The Xpert Xpress SARS-CoV-2/FLU/RSV plus assay is intended as an aid in the diagnosis of influenza from Nasopharyngeal swab specimens and should not be used as a sole  basis for treatment. Nasal washings and aspirates are unacceptable for Xpert Xpress SARS-CoV-2/FLU/RSV testing.  Fact Sheet for Patients: BloggerCourse.com  Fact Sheet for Healthcare Providers: SeriousBroker.it  This test is not yet approved or cleared by the [place] FDA and has been authorized for detection and/or diagnosis of SARS-CoV-2 by FDA under an Emergency Use Authorization (EUA). This EUA will remain in effect (meaning this test can be used) for the duration of the COVID-19 declaration under Section 564(b)(1) of the Act, 21 U.S.C. section 360bbb-3(b)(1), unless the authorization is terminated or revoked.  Performed at [facility], [address], [place], [place] [address]          Radiology Studies: DG Chest 2 View  Result Date: 09/30/2020 CLINICAL DATA:  Dyspnea EXAM: CHEST - 2 VIEW COMPARISON:  09/06/2020  FINDINGS: The lungs are hyperinflated in keeping with changes of underlying COPD. Mild left-sided volume loss is unchanged. The lungs are clear. No pneumothorax or pleural effusion. Cardiac size is within normal limits. Pulmonary vascularity is normal. No acute bone abnormality. IMPRESSION: No active cardiopulmonary disease.  COPD Electronically Signed   By: Helyn Numbers MD   On: 09/30/2020 00:31        Scheduled Meds: . azithromycin  250 mg Oral Daily  . enoxaparin (LOVENOX) injection  40 mg Subcutaneous Q24H  . FLUoxetine  40 mg Oral QHS  . ipratropium-albuterol  3 mL Nebulization Q4H  . melatonin  5 mg Oral QHS  . methylPREDNISolone (SOLU-MEDROL) injection  40 mg Intravenous Q12H  . simvastatin  40 mg Oral QHS   Continuous Infusions:   LOS: 1 day    Time spent: 26 minutes    Marrion Coy, MD [facility]   To contact the attending provider between 7A-7P or the covering provider during after hours 7P-7A, please log into the web site [email or web address] and access using universal [place] password for that web site. If you do not have the password, please call the hospital operator.  10/01/2020, 2:09 PM

## 2020-10-02 DIAGNOSIS — N3000 Acute cystitis without hematuria: Secondary | ICD-10-CM | POA: Diagnosis not present

## 2020-10-02 DIAGNOSIS — J441 Chronic obstructive pulmonary disease with (acute) exacerbation: Secondary | ICD-10-CM | POA: Diagnosis not present

## 2020-10-02 DIAGNOSIS — J9621 Acute and chronic respiratory failure with hypoxia: Secondary | ICD-10-CM | POA: Diagnosis not present

## 2020-10-02 MED ORDER — ADULT MULTIVITAMIN W/MINERALS CH
1.0000 | ORAL_TABLET | Freq: Every day | ORAL | Status: DC
  Administered 2020-10-02 – 2020-10-03 (×2): 1 via ORAL
  Filled 2020-10-02 (×2): qty 1

## 2020-10-02 MED ORDER — SODIUM CHLORIDE 0.9 % IV SOLN
1.0000 g | INTRAVENOUS | Status: DC
  Administered 2020-10-02: 1 g via INTRAVENOUS
  Filled 2020-10-02: qty 10
  Filled 2020-10-02: qty 1

## 2020-10-02 MED ORDER — IPRATROPIUM-ALBUTEROL 0.5-2.5 (3) MG/3ML IN SOLN
3.0000 mL | Freq: Three times a day (TID) | RESPIRATORY_TRACT | Status: DC
  Administered 2020-10-02 – 2020-10-03 (×3): 3 mL via RESPIRATORY_TRACT
  Filled 2020-10-02 (×3): qty 3

## 2020-10-02 MED ORDER — IPRATROPIUM-ALBUTEROL 0.5-2.5 (3) MG/3ML IN SOLN
3.0000 mL | Freq: Four times a day (QID) | RESPIRATORY_TRACT | Status: DC
  Administered 2020-10-02: 08:00:00 3 mL via RESPIRATORY_TRACT
  Filled 2020-10-02: qty 3

## 2020-10-02 NOTE — TOC Progression Note (Signed)
Transition of Care [facility]) - Progression Note    Patient Details  Name: Ashley Brooks MRN: [id number] Date of Birth: 1946/08/27  Transition of Care [facility]) CM/SW Contact  Dianna Limbo [place], [place] Phone Number: [phone] 10/02/2020, 10:22 AM  Clinical Narrative:  [facility] team for discharge planning. Call to pt. No answer. Discharge recommendation: SNF vs Home Health. PT evaluation pending as patient has declined x2. OT's recommendation is Home Health OT presently. Will continue to monitor for PT recommendations.         Expected Discharge Plan and Services                                                 Social Determinants of Health (SDOH) Interventions    Readmission Risk Interventions No flowsheet data found.

## 2020-10-02 NOTE — Progress Notes (Signed)
Initial Nutrition Assessment  RD working remotely.  DOCUMENTATION CODES:   Not applicable  INTERVENTION:  - will order Magic Cup BID with meals, each supplement provides 290 kcal and 9 grams of protein. - will order 1 tablet multivitamin with minerals/day. - complete NFPE at follow-up.  -re-weigh patient today.    NUTRITION DIAGNOSIS:   Increased nutrient needs related to acute illness as evidenced by estimated needs.  GOAL:   Patient will meet greater than or equal to 90% of their needs  MONITOR:   PO intake, Supplement acceptance, Labs, Weight trends  REASON FOR ASSESSMENT:   Consult Assessment of nutrition requirement/status  ASSESSMENT:   74 y.o. female with medical history of COPD, HLD, depression, anxiety, and former tobacco abuse. She presented to the ED with shortness of breath. Her boyfriend recently passed away in her home and since then she has had worsening shortness of breath and wheezing without cough. She recently completed 5 days of abx for UTI. Patient dx with COPD exacerbation and was started on steroids.  No intakes documented since admission; patient has not skipped any meals since admission but reported intakes are variable. Intakes PTA were not always consistent and were often less than baseline d/t grieving the recent loss of her boyfriend.  Patient thinks she likely lost some weight but is unsure how much. Weight on admission date of 12/2 was 120 lb and it appears that weight was simply copied forward from 11/9 and 8/3.   Per notes: - acute hypoxemic respiratory failure 2/2 COPD exacerbation - recent UTI with some ongoing dysuria - hyponatremia thought to be 2/2 SIADH   Labs reviewed; Na: 130 mmol/l, Cl: 96 mmol/l. Medications reviewed; 5 mg melatonin/day, 40 mg solu-medrol BID.    NUTRITION - FOCUSED PHYSICAL EXAM:  unable to complete at this time.   Diet Order:   Diet Order            Diet Heart Room service appropriate? Yes; Fluid  consistency: Thin  Diet effective now                 EDUCATION NEEDS:   No education needs have been identified at this time  Skin:  Skin Assessment: Reviewed RN Assessment  Last BM:  PTA/unknown  Height:   Ht Readings from Last 1 Encounters:  09/29/20 5' (1.524 m)    Weight:   Wt Readings from Last 1 Encounters:  09/29/20 54.4 kg    Estimated Nutritional Needs:  Kcal:  1530-1740 kcal Protein:  65-75 grams Fluid:  >/= 1.6 L/day      Trenton Gammon, MS, RD, LDN, CNSC Inpatient Clinical Dietitian RD pager # available in AMION  After hours/weekend pager # available in [facility]

## 2020-10-02 NOTE — Plan of Care (Signed)
  Problem: Education: Goal: Knowledge of General Education information will improve Description Including pain rating scale, medication(s)/side effects and non-pharmacologic comfort measures Outcome: Progressing   

## 2020-10-02 NOTE — Evaluation (Signed)
Physical Therapy Evaluation Patient Details Name: Ashley Brooks MRN: [id number] DOB: 09-07-1946 Today's Date: 10/02/2020   History of Present Illness  Ashley Brooks is a 74 y.o. female with medical history significant of COPD, hyperlipidemia, depression, anxiety, former tobacco abuse, who presents with shortness of breath and generalized weakness. She reports SOB x 4 days, which has been progressively worsening. Patient has cough with little mucus production, wheezing, chest tightness, but denies active chest pain. She denies nausea, vomiting, diarrhea, or abdominal pain. Patient reports was recent treatment with antibiotics for UTI.     Clinical Impression  Pt received in Semi-Fowler's position and agreeable to therapy.  Pt able to perform bed-level exercises without difficulty and then expressed the need to use the [facility].  Pt was able to perform bed mobility with modI, only requiring extra time to perform.  Pt then utilized FWW to stand and pivot to the [facility] with CGA from therapist.  Pt instructed on safe transfers to the commode in order to prevent falls.  Pt then proceeded to utilize walker for self-care and functional balance.  Pt proceeded ambulate around the nursing station and back to room with utilization of 5 standing rest breaks.  Pt with very labored breathing and instructed to perform purse lip technique in order to assist.  SpO2 stayed above 90% only dropping to 89% for a brief moment during the walk.  Pt ambulated back to bedroom where she transferred to the recliner.  Pt had unsafe movement to the recliner and was educated on proper technique in order to prevent future falls.  Pt understood education and noted that it was likely what lead to her previous falls.  Pt d/c options discussed and HHPT at her ALF was recommended and agreed upon to build up endurance closer to PLOF.  Pt needs met and call bell placed within reach.      Follow Up Recommendations Home health PT;Supervision for  mobility/OOB    Equipment Recommendations  Rolling walker with 5" wheels    Recommendations for Other Services       Precautions / Restrictions Precautions Precautions: Fall Restrictions Weight Bearing Restrictions: No      Mobility  Bed Mobility Overal bed mobility: Modified Independent             General bed mobility comments: increased time    Transfers Overall transfer level: Needs assistance Equipment used: Rolling walker (2 wheeled) Transfers: Sit to/from Stand Sit to Stand: Min guard            Ambulation/Gait Ambulation/Gait assistance: Min guard Gait Distance (Feet): 200 Feet Assistive device: Rolling walker (2 wheeled) Gait Pattern/deviations: Step-through pattern;Decreased stride length Gait velocity: decreaed.   General Gait Details: Pt with very labored breathing during ambulation around the nursing station.  Pt required 5 standing rest breaks before coming back to room.  SpO2 - 89% at lowest.  Science writer    Modified Rankin (Stroke Patients Only)       Balance Overall balance assessment: Needs assistance Sitting-balance support: Feet unsupported;Bilateral upper extremity supported Sitting balance-Leahy Scale: Fair     Standing balance support: Bilateral upper extremity supported;During functional activity Standing balance-Leahy Scale: Fair                               Pertinent Vitals/Pain Pain Assessment: No/denies pain    Home Living Family/patient expects  to be discharged to:: Other (Comment) (Motel)                 Additional Comments: Pt has been living in a motel, while Architect work was being completed on her home. She is scheduled this week to move into [facility].    Prior Function Level of Independence: Needs assistance   Gait / Transfers Assistance Needed: Uses RW. Reports 4-5 falls in previous 6 months.  ADL's / Homemaking Assistance Needed: Does not  drive, does not cook -- mostly orders food delivery. Finds showering difficulty, 2/2 steam exacerbating her COPD.        Hand Dominance        Extremity/Trunk Assessment   Upper Extremity Assessment Upper Extremity Assessment: Generalized weakness    Lower Extremity Assessment Lower Extremity Assessment: Generalized weakness       Communication   Communication: No difficulties  Cognition Arousal/Alertness: Awake/alert Behavior During Therapy: WFL for tasks assessed/performed Overall Cognitive Status: Within Functional Limits for tasks assessed                                 General Comments: Her significant other died last week; pt reports feeling "grief stricken" and anxious      General Comments      Exercises Total Joint Exercises Ankle Circles/Pumps: AROM;Strengthening;Both;10 reps;Supine Quad Sets: AROM;Strengthening;Both;10 reps;Supine Gluteal Sets: AROM;Strengthening;Both;10 reps;Supine Hip ABduction/ADduction: AROM;Strengthening;Both;10 reps;Supine Straight Leg Raises: AROM;Strengthening;Both;10 reps;Supine Long Arc Quad: AROM;Strengthening;Both;10 reps;Seated Marching in Standing: AROM;Strengthening;Both;10 reps;Standing Other Exercises Other Exercises: Pt educated on role of PT and services provided during hospital stay. Other Exercises: Pt educated on self-care techniques in order to toilet in a safe manner when returning to home.   Assessment/Plan    PT Assessment Patient needs continued PT services  PT Problem List Decreased strength;Decreased activity tolerance;Decreased balance;Decreased mobility;Decreased knowledge of use of DME;Decreased safety awareness       PT Treatment Interventions DME instruction;Gait training;Functional mobility training;Therapeutic activities;Therapeutic exercise;Balance training;Neuromuscular re-education    PT Goals (Current goals can be found in the Care Plan section)  Acute Rehab PT Goals Patient  Stated Goal: "to get into my new home" PT Goal Formulation: With patient Time For Goal Achievement: 10/16/20 Potential to Achieve Goals: Good    Frequency Min 2X/week   Barriers to discharge        Co-evaluation               AM-PAC PT "6 Clicks" Mobility  Outcome Measure Help needed turning from your back to your side while in a flat bed without using bedrails?: A Little Help needed moving from lying on your back to sitting on the side of a flat bed without using bedrails?: A Little Help needed moving to and from a bed to a chair (including a wheelchair)?: A Little Help needed standing up from a chair using your arms (e.g., wheelchair or bedside chair)?: A Little Help needed to walk in hospital room?: A Little Help needed climbing 3-5 steps with a railing? : A Lot 6 Click Score: 17    End of Session Equipment Utilized During Treatment: Gait belt Activity Tolerance: Patient limited by fatigue Patient left: in chair;with call bell/phone within reach;with chair alarm set Nurse Communication: Mobility status PT Visit Diagnosis: Unsteadiness on feet (R26.81);Other abnormalities of gait and mobility (R26.89);Repeated falls (R29.6);Muscle weakness (generalized) (M62.81);History of falling (Z91.81);Difficulty in walking, not elsewhere classified (R26.2)    Time:  6389-3734 PT Time Calculation (min) (ACUTE ONLY): 40 min   Charges:   PT Evaluation $PT Eval Low Complexity: 1 Low PT Treatments $Gait Training: 8-22 mins $Therapeutic Exercise: 8-22 mins $Self Care/Home Management: 8-22        Gwenlyn Saran, PT, DPT 10/02/20, 12:04 PM

## 2020-10-02 NOTE — TOC Initial Note (Signed)
Transition of Care [facility]) - Initial/Assessment Note    Patient Details  Name: Ashley Brooks MRN: [id number] Date of Birth: 07/15/1946  Transition of Care Ashley [facility]) CM/SW Contact:    Ashley Deed, Ashley Brooks Phone Number: 10/02/2020, 3:10 PM  Clinical Narrative:                 CSW spoke with pt regarding PT recommendations and discharge plans. Pt was agreeable to [facility] and did not have a preference in agencies. CSW reached out to Ashley Brooks with [facility] with referral for [facility] PT, OT, RN and SW and they were able to accept. Pt stated that she had a RW and no other DME is needed at this time. Anticipated discharge is Monday 12/6  Expected Discharge Plan: [facility] Barriers to Discharge: Continued Medical Work up   Patient Goals and CMS Choice Patient states their goals for this hospitalization and ongoing recovery are:: to get my strength back CMS Medicare.gov Compare Post Acute Care list provided to:: Patient Choice offered to / list presented to : Patient  Expected Discharge Plan and Services Expected Discharge Plan: [facility]       Living arrangements for the past 2 months: Hotel/Motel                 DME Arranged: N/A         HH Arranged: PT, OT, RN, Social Work Ashley Brooks: Ashley Brooks (now Ashley Brooks) Date HH [facility] Contacted: 10/02/20 Time HH [facility] Contacted: [phone] Representative spoke with at Ashley Brooks Brooks: Ashley Brooks  Prior Living Arrangements/Services Living arrangements for the past 2 months: Hotel/Motel Lives with:: Self Patient language and need for interpreter reviewed:: Yes Do you feel safe going back to the place where you live?: Yes      Need for Family Participation in Patient Care: Yes (Comment) Care giver support system in place?: Yes (comment)   Criminal Activity/Legal Involvement Pertinent to Current Situation/Hospitalization: No - Comment as needed  Activities of Daily Living      Permission  Sought/Granted Permission sought to share information with : Family Supports Permission granted to share information with : Yes, Verbal Permission Granted  Share Information with NAME: Ashley Brooks     Permission granted to share info w Relationship: friend  Permission granted to share info w Contact Information: [phone]  Emotional Assessment Appearance:: Other (Comment Required (unable to assess) Attitude/Demeanor/Rapport: Unable to Assess Affect (typically observed): Unable to Assess Orientation: : Oriented to Self, Oriented to Place, Oriented to  Time, Oriented to Situation Alcohol / Substance Use: Not Applicable Psych Involvement: No (comment)  Admission diagnosis:  Hypoxia [R09.02] COPD exacerbation (HCC) [J44.1] Patient Active Problem List   Diagnosis Date Noted  . Hyponatremia 10/01/2020  . Depression with anxiety 09/30/2020  . Acute on chronic respiratory failure with hypoxia (HCC) 09/30/2020  . UTI (urinary tract infection) 09/30/2020  . Hypokalemia 09/30/2020  . COPD exacerbation (HCC) 08/25/2018  . Malnutrition of moderate degree 01/02/2018  . COPD with acute exacerbation (HCC) 01/01/2018  . Anxiety 01/01/2018  . HLD (hyperlipidemia) 01/01/2018  . Tobacco use 12/06/2015   PCP:  Ashley Baseman, Ashley Brooks Pharmacy:   [facility] [phone] - [place], [place] - [address] [place] [address] Phone: [phone] Fax: [phone]     Social Determinants of Health (SDOH) Interventions    Readmission Risk Interventions No flowsheet data found.

## 2020-10-02 NOTE — Progress Notes (Signed)
PROGRESS NOTE    Ashley Brooks  YTK:[id number] DOB: 1946-05-31 DOA: 09/30/2020 PCP: Dorothey Baseman, MD   Chief complaint.  Shortness of breath Brief Narrative:  Glenetta Kiger Turneris a 74 y.o.femalewith medical history significant ofCOPD, hyperlipidemia, depression, anxiety, formertobacco abuse, who presents with shortness of breath. Patient boyfriend passed away in her own home recently, since then, she has been having worsening short of breath and wheezing.  She has no cough.  She recently completed 5 days of antibiotics for UTI. She diagnosed with COPD exacerbation, was placed on steroids. Patient also has urinary symptoms, UA still abnormal.  Restarted Rocephin on 12/5.   Assessment & Plan:   Principal Problem:   COPD exacerbation (HCC) Active Problems:   HLD (hyperlipidemia)   Depression with anxiety   Acute on chronic respiratory failure with hypoxia (HCC)   UTI (urinary tract infection)   Hypokalemia   Hyponatremia  #1.  Acute hypoxemic respiratory failure.  Secondary to COPD exacerbation. COPD exacerbation. Condition improving.  Patient has minimal bronchospasm.  Continue Solu-Medrol for another day. Continue incentive spirometer.  2.  Acute UTI. Patient still has urinary symptoms with abnormal urine.  Rocephin started today.  Pending urine culture sent.  #3.  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  Start fluid restriction.  #4.  Mild thrombocytopenia. Recheck a CBC tomorrow.    DVT prophylaxis: Lovenox Code Status: Full Family Communication: No family to talk to. Disposition Plan:  .   Status is: Inpatient  Remains inpatient appropriate because:Inpatient level of care appropriate due to severity of illness   Dispo: The patient is from: Home              Anticipated d/c is to: Pending PT eval.              Anticipated d/c date is: 1 day              Patient currently is not medically stable to d/c.        No intake/output data recorded. No  intake/output data recorded.     Consultants:   None  Procedures: None  Antimicrobials: Rocephin  Subjective: Patient still has short of breath with exertion.  No cough. Still complaining of some dysuria and frequent urination.   No fever or chills. No abdominal pain nausea vomiting.  Had a normal bowel  Objective: Vitals:   10/02/20 0109 10/02/20 0359 10/02/20 0731 10/02/20 0754  BP: 109/62 (!) 104/57  106/68  Pulse: 79 75  74  Resp: 16 18  16   Temp: 98.5 F (36.9 C) 98.3 F (36.8 C)  97.8 F (36.6 C)  TempSrc: Oral Oral    SpO2: 97% 95% 97% 96%  Weight:      Height:       No intake or output data in the 24 hours ending 10/02/20 0931 Filed Weights   09/29/20 2343  Weight: 54.4 kg    Examination:  General exam: Appears calm and comfortable  Respiratory system: Decreased breathing sounds with minimal wheezing. Respiratory effort normal. Cardiovascular system: S1 & S2 heard, RRR. No JVD, murmurs, rubs, gallops or clicks. No pedal edema. Gastrointestinal system: Abdomen is nondistended, soft and nontender. No organomegaly or masses felt. Normal bowel sounds heard. Central nervous system: Alert and oriented. No focal neurological deficits. Extremities: Symmetric  Skin: No rashes, lesions or ulcers Psychiatry:  Mood & affect appropriate.     Data Reviewed: I have personally reviewed following labs and imaging studies  CBC: Recent Labs  Lab 09/29/20  2346 10/01/20 0500  WBC 8.6 6.0  HGB 13.5 12.6  HCT 36.0 35.7*  MCV 89.8 90.8  PLT 154 139*   Basic Metabolic Panel: Recent Labs  Lab 09/29/20 1246 09/29/20 2346 10/01/20 0500  NA  --  133* 130*  K  --  3.3* 3.9  CL  --  98 96*  CO2  --  22 25  GLUCOSE  --  152* 157*  BUN  --  14 21  CREATININE  --  0.54 0.56  CALCIUM  --  8.8* 9.0  MG 2.0  --   --    GFR: Estimated Creatinine Clearance: 44.3 mL/min (by C-G formula based on SCr of 0.56 mg/dL). Liver Function Tests: No results for input(s):  AST, ALT, ALKPHOS, BILITOT, PROT, ALBUMIN in the last 168 hours. No results for input(s): LIPASE, AMYLASE in the last 168 hours. No results for input(s): AMMONIA in the last 168 hours. Coagulation Profile: No results for input(s): INR, PROTIME in the last 168 hours. Cardiac Enzymes: No results for input(s): CKTOTAL, CKMB, CKMBINDEX, TROPONINI in the last 168 hours. BNP (last 3 results) No results for input(s): PROBNP in the last 8760 hours. HbA1C: No results for input(s): HGBA1C in the last 72 hours. CBG: No results for input(s): GLUCAP in the last 168 hours. Lipid Profile: No results for input(s): CHOL, HDL, LDLCALC, TRIG, CHOLHDL, LDLDIRECT in the last 72 hours. Thyroid Function Tests: No results for input(s): TSH, T4TOTAL, FREET4, T3FREE, THYROIDAB in the last 72 hours. Anemia Panel: No results for input(s): VITAMINB12, FOLATE, FERRITIN, TIBC, IRON, RETICCTPCT in the last 72 hours. Sepsis Labs: No results for input(s): PROCALCITON, LATICACIDVEN in the last 168 hours.  Recent Results (from the past 240 hour(s))  Resp Panel by RT-PCR (Flu A&B, Covid) Nasopharyngeal Swab     Status: None   Collection Time: 09/30/20  7:29 AM   Specimen: Nasopharyngeal Swab; Nasopharyngeal(NP) swabs in vial transport medium  Result Value Ref Range Status   SARS Coronavirus 2 by RT PCR NEGATIVE NEGATIVE Final    Comment: (NOTE) SARS-CoV-2 target nucleic acids are NOT DETECTED.  The SARS-CoV-2 RNA is generally detectable in upper respiratory specimens during the acute phase of infection. The lowest concentration of SARS-CoV-2 viral copies this assay can detect is 138 copies/mL. A negative result does not preclude SARS-Cov-2 infection and should not be used as the sole basis for treatment or other patient management decisions. A negative result may occur with  improper specimen collection/handling, submission of specimen other than nasopharyngeal swab, presence of viral mutation(s) within the areas  targeted by this assay, and inadequate number of viral copies(<138 copies/mL). A negative result must be combined with clinical observations, patient history, and epidemiological information. The expected result is Negative.  Fact Sheet for Patients:  BloggerCourse.com  Fact Sheet for Healthcare Providers:  SeriousBroker.it  This test is no t yet approved or cleared by the [place] FDA and  has been authorized for detection and/or diagnosis of SARS-CoV-2 by FDA under an Emergency Use Authorization (EUA). This EUA will remain  in effect (meaning this test can be used) for the duration of the COVID-19 declaration under Section 564(b)(1) of the Act, 21 U.S.C.section 360bbb-3(b)(1), unless the authorization is terminated  or revoked sooner.       Influenza A by PCR NEGATIVE NEGATIVE Final   Influenza B by PCR NEGATIVE NEGATIVE Final    Comment: (NOTE) The Xpert Xpress SARS-CoV-2/FLU/RSV plus assay is intended as an aid in the diagnosis of influenza  from Nasopharyngeal swab specimens and should not be used as a sole basis for treatment. Nasal washings and aspirates are unacceptable for Xpert Xpress SARS-CoV-2/FLU/RSV testing.  Fact Sheet for Patients: BloggerCourse.com  Fact Sheet for Healthcare Providers: SeriousBroker.it  This test is not yet approved or cleared by the [place] FDA and has been authorized for detection and/or diagnosis of SARS-CoV-2 by FDA under an Emergency Use Authorization (EUA). This EUA will remain in effect (meaning this test can be used) for the duration of the COVID-19 declaration under Section 564(b)(1) of the Act, 21 U.S.C. section 360bbb-3(b)(1), unless the authorization is terminated or revoked.  Performed at [facility], [address] [place], [place] [address]          Radiology Studies: No results found.       Scheduled Meds: . azithromycin  250 mg Oral Daily  . enoxaparin (LOVENOX) injection  40 mg Subcutaneous Q24H  . FLUoxetine  40 mg Oral QHS  . ipratropium-albuterol  3 mL Nebulization QID  . melatonin  5 mg Oral QHS  . methylPREDNISolone (SOLU-MEDROL) injection  40 mg Intravenous Q12H  . multivitamin with minerals  1 tablet Oral Daily  . simvastatin  40 mg Oral QHS   Continuous Infusions: . cefTRIAXone (ROCEPHIN)  IV       LOS: 2 days    Time spent: 28 minutes    Marrion Coy, MD [facility]   To contact the attending provider between 7A-7P or the covering provider during after hours 7P-7A, please log into the web site [email or web address] and access using universal Liberal password for that web site. If you do not have the password, please call the hospital operator.  10/02/2020, 9:31 AM

## 2020-10-02 NOTE — Progress Notes (Signed)
PT Cancellation Note  Patient Details Name: MELVENIA FAVELA MRN: [id number] DOB: 1946-05-23   Cancelled Treatment:    Reason Eval/Treat Not Completed: Patient declined, no reason specified.  Pt currently getting IV from nursing staff.  Breakfast also on tray, and pt requests to eat breakfast before starting PT.  Will re-attempt at later time/date as medically appropriate.   Nolon Bussing, PT, DPT 10/02/20, 9:38 AM

## 2020-10-03 DIAGNOSIS — J9621 Acute and chronic respiratory failure with hypoxia: Secondary | ICD-10-CM | POA: Diagnosis not present

## 2020-10-03 DIAGNOSIS — N3000 Acute cystitis without hematuria: Secondary | ICD-10-CM | POA: Diagnosis not present

## 2020-10-03 DIAGNOSIS — J441 Chronic obstructive pulmonary disease with (acute) exacerbation: Secondary | ICD-10-CM | POA: Diagnosis not present

## 2020-10-03 LAB — CBC WITH DIFFERENTIAL/PLATELET
Abs Immature Granulocytes: 0.09 10*3/uL — ABNORMAL HIGH (ref 0.00–0.07)
Basophils Absolute: 0 10*3/uL (ref 0.0–0.1)
Basophils Relative: 0 %
Eosinophils Absolute: 0 10*3/uL (ref 0.0–0.5)
Eosinophils Relative: 0 %
HCT: 34.2 % — ABNORMAL LOW (ref 36.0–46.0)
Hemoglobin: 12 g/dL (ref 12.0–15.0)
Immature Granulocytes: 2 %
Lymphocytes Relative: 7 %
Lymphs Abs: 0.4 10*3/uL — ABNORMAL LOW (ref 0.7–4.0)
MCH: 32.3 pg (ref 26.0–34.0)
MCHC: 35.1 g/dL (ref 30.0–36.0)
MCV: 92.2 fL (ref 80.0–100.0)
Monocytes Absolute: 0.2 10*3/uL (ref 0.1–1.0)
Monocytes Relative: 4 %
Neutro Abs: 5.5 10*3/uL (ref 1.7–7.7)
Neutrophils Relative %: 87 %
Platelets: 189 10*3/uL (ref 150–400)
RBC: 3.71 MIL/uL — ABNORMAL LOW (ref 3.87–5.11)
RDW: 12.8 % (ref 11.5–15.5)
WBC: 6.2 10*3/uL (ref 4.0–10.5)
nRBC: 0 % (ref 0.0–0.2)

## 2020-10-03 LAB — BASIC METABOLIC PANEL
Anion gap: 8 (ref 5–15)
BUN: 20 mg/dL (ref 8–23)
CO2: 28 mmol/L (ref 22–32)
Calcium: 8.7 mg/dL — ABNORMAL LOW (ref 8.9–10.3)
Chloride: 98 mmol/L (ref 98–111)
Creatinine, Ser: 0.67 mg/dL (ref 0.44–1.00)
GFR, Estimated: >60 mL/min (ref >60)
Glucose, Bld: 137 mg/dL — ABNORMAL HIGH (ref 70–99)
Potassium: 4.4 mmol/L (ref 3.5–5.1)
Sodium: 134 mmol/L — ABNORMAL LOW (ref 135–145)

## 2020-10-03 MED ORDER — FOSFOMYCIN TROMETHAMINE 3 G PO PACK: 3.0000 g | PACK | Freq: Once | ORAL | 0 refills | Status: AC

## 2020-10-03 MED ORDER — PREDNISONE 10 MG PO TABS: ORAL_TABLET | ORAL | 0 refills | Status: AC

## 2020-10-03 MED ORDER — FOSFOMYCIN TROMETHAMINE 3 G PO PACK
3.0000 g | PACK | Freq: Once | ORAL | Status: AC
  Administered 2020-10-03: 3 g via ORAL
  Filled 2020-10-03: qty 3

## 2020-10-03 NOTE — Care Management Important Message (Signed)
Important Message  Patient Details  Name: Ashley Brooks MRN: [id number] Date of Birth: [birth year]   Medicare Important Message Given:  Yes     [name] 10/03/2020, 10:39 AM

## 2020-10-03 NOTE — TOC Transition Note (Signed)
Transition of Care [facility]) - CM/SW Discharge Note   Patient Details  Name: Ashley Brooks MRN: [id number] Date of Birth: 11/07/1945  Transition of [facility]) CM/SW Contact:  Allayne Butcher, RN Phone Number: 10/03/2020, 12:06 PM   Clinical Narrative:    Patient is medically cleared for discharge home with home health services through [facility].  Patient has just moved into [facility] facility.  [facility] notified that patient will discharge today.     Final next level of care: Home w Home Health Services Barriers to Discharge: Barriers Resolved   Patient Goals and CMS Choice Patient states their goals for this hospitalization and ongoing recovery are:: to get my strength back CMS Medicare.gov Compare Post Acute Care list provided to:: Patient Choice offered to / list presented to : Patient  Discharge Placement                       Discharge Plan and Services                DME Arranged: N/A         HH Arranged: PT, OT, RN, Social Work [employer] Agency: [facility] (now [facility]) Date HH Agency Contacted: 10/03/20 Time HH Agency Contacted: [phone] Representative spoke with at [facility] Agency: Mellissa Kohut  Social Determinants of Health (SDOH) Interventions     Readmission Risk Interventions No flowsheet data found.

## 2020-10-03 NOTE — Discharge Summary (Signed)
Physician Discharge Summary  Patient ID: Ashley Brooks MRN: [id number] DOB/AGE: 1946/07/12 74 y.o.  Admit date: 09/30/2020 Discharge date: 10/03/2020  Admission Diagnoses:  Discharge Diagnoses:  Principal Problem:   COPD exacerbation (HCC) Active Problems:   HLD (hyperlipidemia)   Depression with anxiety   Acute on chronic respiratory failure with hypoxia (HCC)   UTI (urinary tract infection)   Hypokalemia   Hyponatremia   Discharged Condition: good  Hospital Course:  Ashley Vassey Turneris a 74 y.o.femalewith medical history significant ofCOPD, hyperlipidemia, depression, anxiety, formertobacco abuse, who presents with shortness of breath. Patient boyfriend passed away in her own home recently, since then, she has been having worsening short of breath and wheezing. She has no cough. She recently completed 5 days of antibiotics for UTI. She diagnosed with COPD exacerbation, was placed on steroids. Patient also has urinary symptoms, UA still abnormal.  Restarted Rocephin on 12/5.  #1.  Acute hypoxemic respiratory failure secondary to COPD exacerbation. COPD exacerbation. Patient condition has improved.  Patient has been off oxygen.  Patient was evaluated by physical therapy and occupational therapy, she does not need nursing home placement.  She will be discharged home today in a stable condition, continue steroid taper. Patient be followed by pulmonology and primary care physician as outpatient.  #2.  Pseudomonas UTI. Patient had a continued symptom after admission to the hospital, she was previously treated for 5 days with Bactrim.  Urine culture came back with Pseudomonas.  She will be given a dose of fosfomycin, followed another dose in 3 days.  #3.  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  4.  Mild thrombocytopenia. Improved    Consults: None  Significant Diagnostic Studies:   Treatments: Steroids, Rocephin.  Zithromax  Discharge Exam: Blood pressure  116/73, pulse 70, temperature 97.8 F (36.6 C), temperature source Oral, resp. rate 18, height 5' (1.524 m), weight 43.8 kg, SpO2 97 %. General appearance: alert and cooperative Resp: Decreased breathing sounds without wheezes or crackles. Cardio: regular rate and rhythm, S1, S2 normal, no murmur, click, rub or gallop GI: soft, non-tender; bowel sounds normal; no masses,  no organomegaly Extremities: extremities normal, atraumatic, no cyanosis or edema  Disposition: Discharge disposition: 01-Home or Self Care       Discharge Instructions    Diet - low sodium heart healthy   Complete by: As directed    Increase activity slowly   Complete by: As directed      Allergies as of 10/03/2020   No Known Allergies     Medication List    STOP taking these medications   nitrofurantoin (macrocrystal-monohydrate) 100 MG capsule Commonly known as: MACROBID     TAKE these medications   ALPRAZolam 0.25 MG tablet Commonly known as: XANAX Take 0.25 mg by mouth 3 (three) times daily as needed for anxiety.   aspirin 325 MG tablet Take 325 mg by mouth daily as needed for headache.   FLUoxetine 40 MG capsule Commonly known as: PROZAC Take 40 mg by mouth daily.   fosfomycin 3 g Pack Commonly known as: MONUROL Take 3 g by mouth once for 1 dose. Start taking on: October 06, 2020   ipratropium-albuterol 0.5-2.5 (3) MG/3ML Soln Commonly known as: DUONEB Take 3 mLs by nebulization every 2 (two) hours as needed.   melatonin 3 MG Tabs tablet Take 3 mg by mouth at bedtime.   predniSONE 10 MG tablet Commonly known as: DELTASONE Take 4 tablets (40 mg total) by mouth daily for 3 days, THEN 2  tablets (20 mg total) daily for 3 days, THEN 1 tablet (10 mg total) daily for 3 days. Start taking on: October 03, 2020   simvastatin 40 MG tablet Commonly known as: ZOCOR Take 40 mg by mouth at bedtime.   Stiolto Respimat 2.5-2.5 MCG/ACT Aers Generic drug: Tiotropium Bromide-Olodaterol Inhale 2  puffs into the lungs daily.   Ventolin HFA 108 (90 Base) MCG/ACT inhaler Generic drug: albuterol Inhale 2 puffs into the lungs every 4 (four) hours as needed for wheezing.       Follow-up Information    Dorothey Baseman, MD Follow up in 1 week(s).   Specialty: Family Medicine Contact information: [address] [place] [address] [phone]        Vida Rigger, MD Follow up in 4 week(s).   Specialty: Pulmonary Disease Contact information: [address] [place] [address] [phone]               Signed: Marrion Coy 10/03/2020, 9:27 AM

## 2020-10-04 LAB — URINE CULTURE: Culture: >=100000 — AB

## 2020-10-05 ENCOUNTER — Telehealth: Payer: Self-pay | Admitting: *Deleted

## 2020-10-05 NOTE — Telephone Encounter (Signed)
Contacted patient to inquire about timing of follow up CT from lung screening scan. Noted patient with recent hospitalization. Patient reports she is moving to assisted living and has other appts. Will contact me when she is ready for scheduling her lung screening follow up CT

## 2020-11-03 ENCOUNTER — Other Ambulatory Visit: Payer: Self-pay | Admitting: *Deleted

## 2020-11-03 DIAGNOSIS — Z87891 Personal history of nicotine dependence: Secondary | ICD-10-CM

## 2020-11-03 DIAGNOSIS — R918 Other nonspecific abnormal finding of lung field: Secondary | ICD-10-CM

## 2020-11-03 NOTE — Progress Notes (Signed)
Contacted and scheduled for LCS nodule follow up scan. Patient is a former smoker, quit 12/26/17, 42 pack year.

## 2020-11-18 ENCOUNTER — Encounter: Payer: Self-pay | Admitting: Emergency Medicine

## 2020-11-18 ENCOUNTER — Other Ambulatory Visit: Payer: Self-pay

## 2020-11-18 ENCOUNTER — Emergency Department
Admission: EM | Admit: 2020-11-18 | Discharge: 2020-11-18 | Disposition: A | Discharge status: Home / Self Care | Payer: Medicare PPO | Attending: Emergency Medicine | Admitting: Emergency Medicine

## 2020-11-18 ENCOUNTER — Emergency Department: Payer: Medicare PPO

## 2020-11-18 DIAGNOSIS — Z7982 Long term (current) use of aspirin: Secondary | ICD-10-CM | POA: Insufficient documentation

## 2020-11-18 DIAGNOSIS — U071 COVID-19: Secondary | ICD-10-CM | POA: Diagnosis not present

## 2020-11-18 DIAGNOSIS — J441 Chronic obstructive pulmonary disease with (acute) exacerbation: Secondary | ICD-10-CM | POA: Insufficient documentation

## 2020-11-18 DIAGNOSIS — Z7951 Long term (current) use of inhaled steroids: Secondary | ICD-10-CM | POA: Insufficient documentation

## 2020-11-18 DIAGNOSIS — Z87891 Personal history of nicotine dependence: Secondary | ICD-10-CM | POA: Diagnosis not present

## 2020-11-18 DIAGNOSIS — R0602 Shortness of breath: Secondary | ICD-10-CM | POA: Diagnosis present

## 2020-11-18 LAB — COMPREHENSIVE METABOLIC PANEL
ALT: 12 U/L (ref 0–44)
AST: 18 U/L (ref 15–41)
Albumin: 3.4 g/dL — ABNORMAL LOW (ref 3.5–5.0)
Alkaline Phosphatase: 34 U/L — ABNORMAL LOW (ref 38–126)
Anion gap: 11 (ref 5–15)
BUN: 17 mg/dL (ref 8–23)
CO2: 26 mmol/L (ref 22–32)
Calcium: 9 mg/dL (ref 8.9–10.3)
Chloride: 103 mmol/L (ref 98–111)
Creatinine, Ser: 0.63 mg/dL (ref 0.44–1.00)
GFR, Estimated: >60 mL/min (ref >60)
Glucose, Bld: 123 mg/dL — ABNORMAL HIGH (ref 70–99)
Potassium: 3.2 mmol/L — ABNORMAL LOW (ref 3.5–5.1)
Sodium: 140 mmol/L (ref 135–145)
Total Bilirubin: 0.8 mg/dL (ref 0.3–1.2)
Total Protein: 6.8 g/dL (ref 6.5–8.1)

## 2020-11-18 LAB — CBC WITH DIFFERENTIAL/PLATELET
Abs Immature Granulocytes: 0.11 10*3/uL — ABNORMAL HIGH (ref 0.00–0.07)
Basophils Absolute: 0 10*3/uL (ref 0.0–0.1)
Basophils Relative: 0 %
Eosinophils Absolute: 0 10*3/uL (ref 0.0–0.5)
Eosinophils Relative: 1 %
HCT: 35.7 % — ABNORMAL LOW (ref 36.0–46.0)
Hemoglobin: 12.3 g/dL (ref 12.0–15.0)
Immature Granulocytes: 2 %
Lymphocytes Relative: 13 %
Lymphs Abs: 0.8 10*3/uL (ref 0.7–4.0)
MCH: 31.8 pg (ref 26.0–34.0)
MCHC: 34.5 g/dL (ref 30.0–36.0)
MCV: 92.2 fL (ref 80.0–100.0)
Monocytes Absolute: 0.5 10*3/uL (ref 0.1–1.0)
Monocytes Relative: 8 %
Neutro Abs: 4.6 10*3/uL (ref 1.7–7.7)
Neutrophils Relative %: 76 %
Platelets: 306 10*3/uL (ref 150–400)
RBC: 3.87 MIL/uL (ref 3.87–5.11)
RDW: 13.3 % (ref 11.5–15.5)
WBC: 6.1 10*3/uL (ref 4.0–10.5)
nRBC: 0 % (ref 0.0–0.2)

## 2020-11-18 LAB — TROPONIN I (HIGH SENSITIVITY): Troponin I (High Sensitivity): 4 ng/L (ref <18)

## 2020-11-18 MED ORDER — IPRATROPIUM-ALBUTEROL 0.5-2.5 (3) MG/3ML IN SOLN
3.0000 mL | Freq: Once | RESPIRATORY_TRACT | Status: AC
  Administered 2020-11-18: 3 mL via RESPIRATORY_TRACT
  Filled 2020-11-18: qty 3

## 2020-11-18 MED ORDER — PREDNISONE 20 MG PO TABS: 60.0000 mg | ORAL_TABLET | Freq: Every day | ORAL | 0 refills | Status: AC

## 2020-11-18 MED ORDER — PREDNISONE 20 MG PO TABS
60.0000 mg | ORAL_TABLET | Freq: Once | ORAL | Status: AC
  Administered 2020-11-18: 60 mg via ORAL
  Filled 2020-11-18: qty 3

## 2020-11-18 NOTE — Discharge Instructions (Signed)
Take the take the prednisone as prescribed.  Use your albuterol inhaler every 4-6 hours for the next several days.  Return to the ER for new, worsening, or persistent severe shortness of breath, chest pain, weakness, fever, vomiting, or any other new or worsening symptoms that concern you.

## 2020-11-18 NOTE — ED Provider Notes (Signed)
[facility] Emergency Department Provider Note ____________________________________________   Event Date/Time   First MD Initiated Contact with Patient 11/18/20 [phone]     (approximate)  I have reviewed the triage vital signs and the nursing notes.   HISTORY  Chief Complaint Shortness of Breath    HPI Ashley Brooks is a 75 y.o. female with PMH as noted below including history of COPD who presents with worsening shortness of breath over the last several days associated with a dry cough, persistent course, not associated with fever or chills, chest pain, or vomiting.  The patient states that she was diagnosed with COVID-19 about a week ago after having some diarrhea.  She states that the diarrhea has resolved.  Past Medical History:  Diagnosis Date   Anxiety    COPD (chronic obstructive pulmonary disease) (HCC)    HLD (hyperlipidemia)    Personal history of tobacco use, presenting hazards to health 12/06/2015    Patient Active Problem List   Diagnosis Date Noted   Hyponatremia 10/01/2020   Depression with anxiety 09/30/2020   Acute on chronic respiratory failure with hypoxia (HCC) 09/30/2020   UTI (urinary tract infection) 09/30/2020   Hypokalemia 09/30/2020   COPD exacerbation (HCC) 08/25/2018   Malnutrition of moderate degree 01/02/2018   COPD with acute exacerbation (HCC) 01/01/2018   Anxiety 01/01/2018   HLD (hyperlipidemia) 01/01/2018   Tobacco use 12/06/2015    Past Surgical History:  Procedure Laterality Date   CATARACT EXTRACTION     right foot fusion     TUBAL LIGATION      Prior to Admission medications   Medication Sig Start Date End Date Taking? Authorizing Provider  predniSONE (DELTASONE) 20 MG tablet Take 3 tablets (60 mg total) by mouth daily with breakfast for 5 days. 11/19/20 11/24/20 Yes Dionne Bucy, MD  ALPRAZolam Prudy Feeler) 0.25 MG tablet Take 0.25 mg by mouth 3 (three) times daily as needed for  anxiety. 09/26/20   [provider]  aspirin 325 MG tablet Take 325 mg by mouth daily as needed for headache. Patient not taking: Reported on 09/30/2020    [provider]  FLUoxetine (PROZAC) 40 MG capsule Take 40 mg by mouth daily.     [provider]  ipratropium-albuterol (DUONEB) 0.5-2.5 (3) MG/3ML SOLN Take 3 mLs by nebulization every 2 (two) hours as needed. 01/02/18   Enedina Finner, MD  melatonin 3 MG TABS tablet Take 3 mg by mouth at bedtime.    [provider]  simvastatin (ZOCOR) 40 MG tablet Take 40 mg by mouth at bedtime.  08/28/16   [provider]  STIOLTO RESPIMAT 2.5-2.5 MCG/ACT AERS Inhale 2 puffs into the lungs daily. 09/23/20   [provider]  VENTOLIN HFA 108 (90 Base) MCG/ACT inhaler Inhale 2 puffs into the lungs every 4 (four) hours as needed for wheezing. 06/03/18   [provider]    Allergies Patient has no known allergies.  Family History  Problem Relation Age of Onset   Colon cancer Mother    Breast cancer Neg Hx     Social History Social History   Tobacco Use   Smoking status: Former Smoker    Packs/day: 1.00    Years: 42.00    Pack years: 42.00    Quit date: 12/26/2017    Years since quitting: 2.8   Smokeless tobacco: Never Used  Vaping Use   Vaping Use: Never used  Substance Use Topics   Alcohol use: Yes  Alcohol/week: 2.0 standard drinks    Types: 2 Glasses of wine per week    Comment: 2 drinks/ day   Drug use: No    Review of Systems  Constitutional: No fever/chills. Eyes: No visual changes. ENT: No sore throat. Cardiovascular: Denies chest pain. Respiratory: Positive for shortness of breath. Gastrointestinal: No vomiting. Genitourinary: Negative for dysuria.  Musculoskeletal: Negative for back pain. Skin: Negative for rash. Neurological: Negative for headache.   ____________________________________________   PHYSICAL EXAM:  VITAL SIGNS: ED Triage Vitals  Enc  Vitals Group     BP 11/18/20 0849 123/68     Pulse Rate 11/18/20 0849 100     Resp 11/18/20 0849 20     Temp 11/18/20 0849 98 F (36.7 C)     Temp Source 11/18/20 0849 Oral     SpO2 11/18/20 0849 98 %     Weight 11/18/20 0846 94 lb 12.8 oz (43 kg)     Height 11/18/20 0846 5' (1.524 m)     Head Circumference --      Peak Flow --      Pain Score 11/18/20 0849 0     Pain Loc --      Pain Edu? --      Excl. in GC? --     Constitutional: Alert and oriented. Well appearing and in no acute distress. Eyes: Conjunctivae are normal.  Head: Atraumatic. Nose: No congestion/rhinnorhea. Mouth/Throat: Mucous membranes are moist.   Neck: Normal range of motion.  Cardiovascular: Normal rate, regular rhythm. Grossly normal heart sounds.  Good peripheral circulation. Respiratory: Normal respiratory effort.  No retractions.  Diminished breath sounds bilaterally Gastrointestinal: No distention.  Genitourinary: No CVA tenderness. Musculoskeletal: No lower extremity edema.  Extremities warm and well perfused.  Neurologic:  Normal speech and language. No gross focal neurologic deficits are appreciated.  Skin:  Skin is warm and dry. No rash noted. Psychiatric: Mood and affect are normal. Speech and behavior are normal.  ____________________________________________   LABS (all labs ordered are listed, but only abnormal results are displayed)  Labs Reviewed  CBC WITH DIFFERENTIAL/PLATELET - Abnormal; Notable for the following components:      Result Value   HCT 35.7 (*)    Abs Immature Granulocytes 0.11 (*)    All other components within normal limits  COMPREHENSIVE METABOLIC PANEL - Abnormal; Notable for the following components:   Potassium 3.2 (*)    Glucose, Bld 123 (*)    Albumin 3.4 (*)    Alkaline Phosphatase 34 (*)    All other components within normal limits  TROPONIN I (HIGH SENSITIVITY)   ____________________________________________  EKG  ED ECG REPORT I, Dionne Bucy,  the attending physician, personally viewed and interpreted this ECG.  Date: 11/18/2020 EKG Time: 0849 Rate: 95 Rhythm: normal sinus rhythm QRS Axis: Right superior axis deviation Intervals: normal ST/T Wave abnormalities: Nonspecific T wave abnormalities Narrative Interpretation: Nonspecific abnormalities with no evidence of acute ischemia  ____________________________________________  RADIOLOGY  Chest x-ray interpreted by me shows no focal infiltrate or edema  ____________________________________________   PROCEDURES  Procedure(s) performed: No  Procedures  Critical Care performed: No ____________________________________________   INITIAL IMPRESSION / ASSESSMENT AND PLAN / ED COURSE  Pertinent labs & imaging results that were available during my care of the patient were reviewed by me and considered in my medical decision making (see chart for details).  74 year old female with history of COPD presents with increased shortness of breath over the last several days after being diagnosed  with COVID-19 last week.  I reviewed the past medical records in epic.  The patient was last admitted in early December of last year with a COPD exacerbation, but required oxygen at that time.  On exam currently she is overall well-appearing.  Her vital signs are normal.  O2 saturation is in the high 90s on room air.  She has somewhat diminished breath sounds bilaterally but no accessory muscle use or evidence of respiratory distress.  Overall presentation is consistent with COPD exacerbation, with likely component of COVID.  Chest x-ray shows bronchitic findings without evidence of pneumonia.  There is no evidence of CHF or other cardiac etiology.  We will give duo nebs, prednisone, and reassess.  I anticipate discharge home.  ----------------------------------------- 1:00 PM on 11/18/2020 -----------------------------------------  The patient felt better after the nebs and steroid.  She  stated that she wanted to go home.  Return precautions given, and she expressed understanding.  ____________________________________________   FINAL CLINICAL IMPRESSION(S) / ED DIAGNOSES  Final diagnoses:  COPD exacerbation (HCC)  COVID      NEW MEDICATIONS STARTED DURING THIS VISIT:  Discharge Medication List as of 11/18/2020 12:43 PM    START taking these medications   Details  predniSONE (DELTASONE) 20 MG tablet Take 3 tablets (60 mg total) by mouth daily with breakfast for 5 days., Starting Sat 11/19/2020, Until Thu 11/24/2020, Normal         Note:  This document was prepared using Dragon voice recognition software and may include unintentional dictation errors.   Dionne Bucy, MD 11/18/20 [phone]

## 2020-11-18 NOTE — ED Triage Notes (Addendum)
Presents with some SOB and cough  Hx of COPD  But states last pm she thought the sxs' were worse  No fever  States she tested positive for COVID last week at [facility]

## 2020-11-22 ENCOUNTER — Ambulatory Visit: Admission: RE | Admit: 2020-11-22 | Payer: Medicare PPO | Source: Ambulatory Visit

## 2020-12-06 ENCOUNTER — Ambulatory Visit
Admission: RE | Admit: 2020-12-06 | Discharge: 2020-12-06 | Disposition: A | Discharge status: Home / Self Care | Payer: Medicare PPO | Source: Ambulatory Visit | Attending: Oncology | Admitting: Oncology

## 2020-12-06 ENCOUNTER — Other Ambulatory Visit: Payer: Self-pay

## 2020-12-06 DIAGNOSIS — J439 Emphysema, unspecified: Secondary | ICD-10-CM | POA: Diagnosis not present

## 2020-12-06 DIAGNOSIS — I700 Atherosclerosis of aorta: Secondary | ICD-10-CM | POA: Insufficient documentation

## 2020-12-06 DIAGNOSIS — Z87891 Personal history of nicotine dependence: Secondary | ICD-10-CM | POA: Diagnosis not present

## 2020-12-06 DIAGNOSIS — Z122 Encounter for screening for malignant neoplasm of respiratory organs: Secondary | ICD-10-CM | POA: Diagnosis present

## 2020-12-06 DIAGNOSIS — R918 Other nonspecific abnormal finding of lung field: Secondary | ICD-10-CM | POA: Insufficient documentation

## 2020-12-08 ENCOUNTER — Encounter: Payer: Self-pay | Admitting: *Deleted

## 2021-12-02 IMAGING — CT CT CHEST LCS NODULE FOLLOW-UP W/O CM
2 of 5 series · 15 of 40 positions shown, 18 images · non-contrast
Comparison: 05/31/2020

CLINICAL DATA: 74-year-old female with 42 pack-year history of
smoking. Lung cancer screening.

EXAM:
CT CHEST WITHOUT CONTRAST FOR LUNG CANCER SCREENING NODULE FOLLOW-UP
TECHNIQUE: Multidetector CT imaging of the chest was performed following the
standard protocol without IV contrast.

[Series 3: lung lcs f/u 1.00 · axial · 0.52mm/px · z∈[-1307,-1009]mm · 12 of 330 slices shown, 15 images]
[im 16/330  mediastinal]
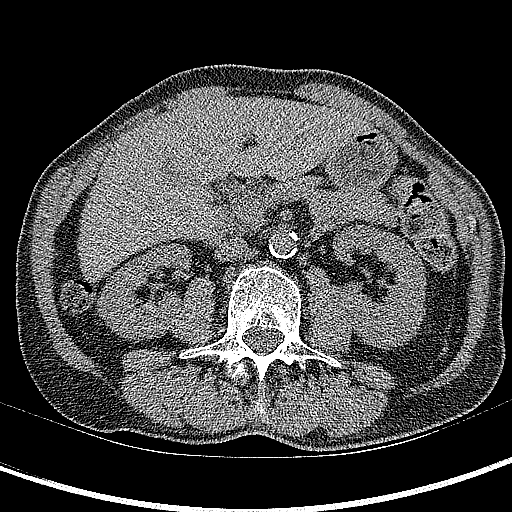
[im 16/330  lung]
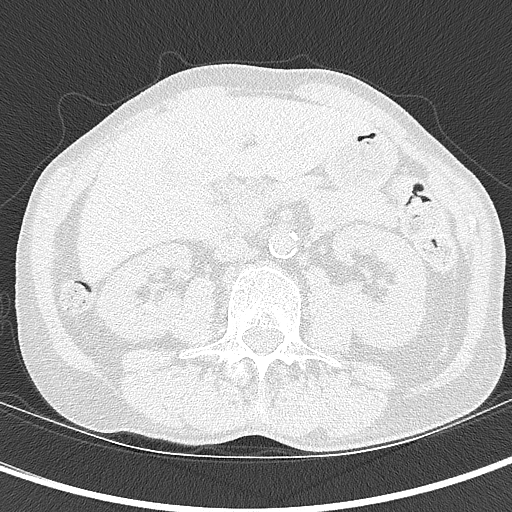
[im 48/330  lung]
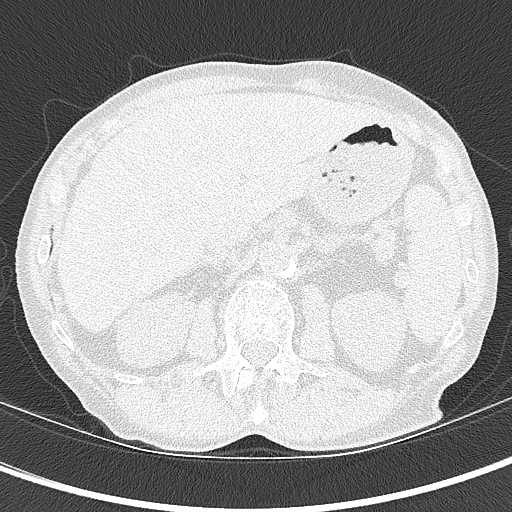
[im 79/330  lung]
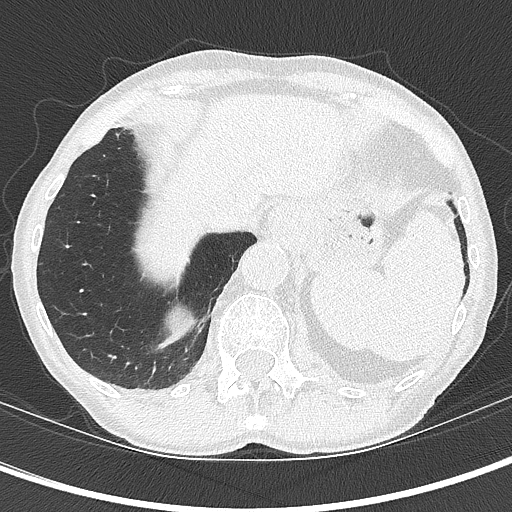
[im 95/330  lung]
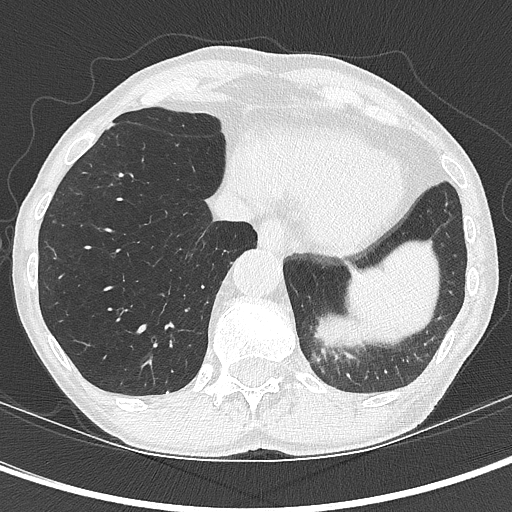
[im 126/330  mediastinal]
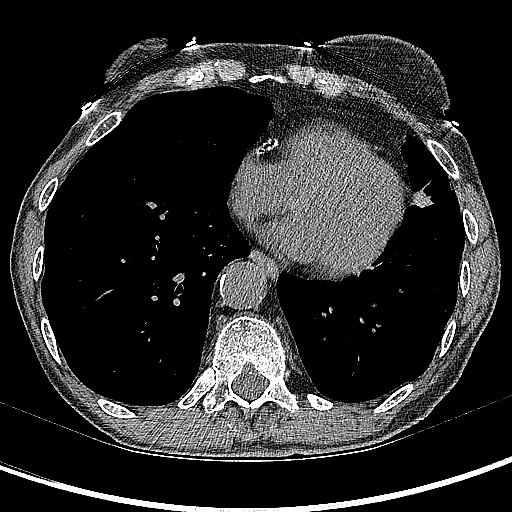
[im 126/330  lung]
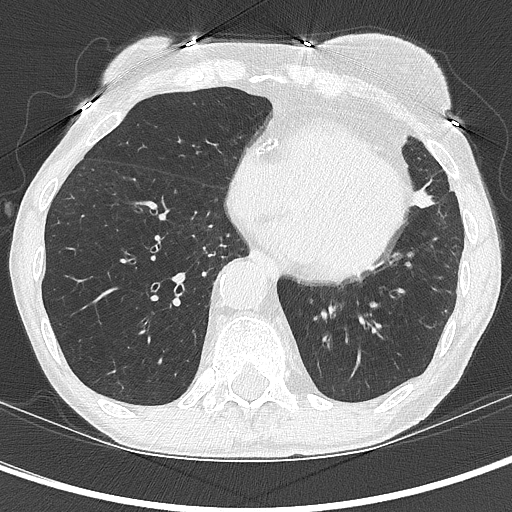
[im 157/330  lung]
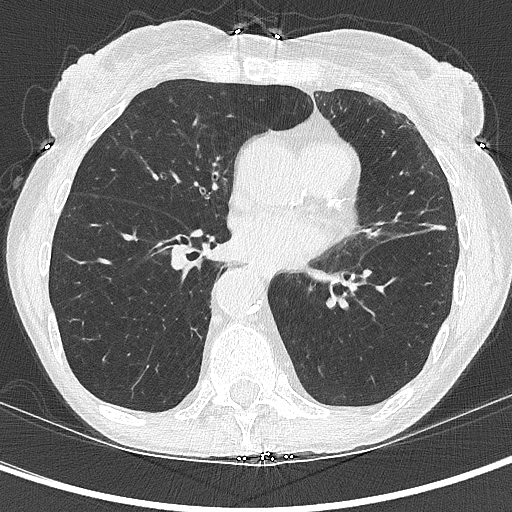
[im 173/330  lung]
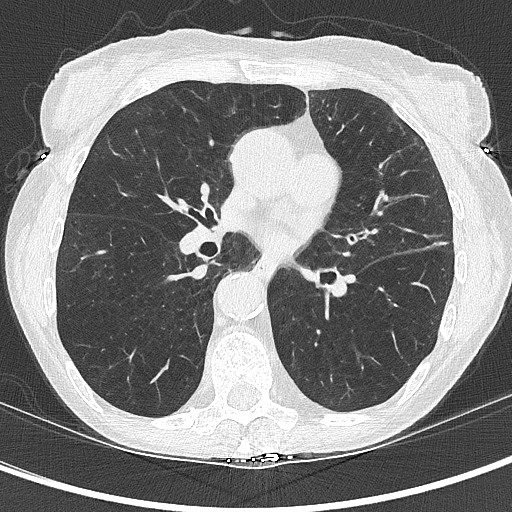
[im 204/330  lung]
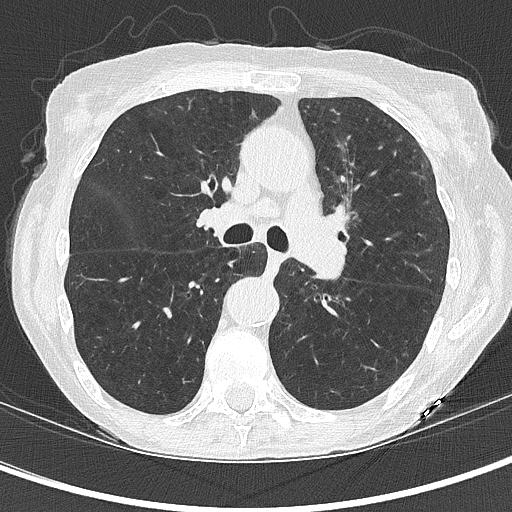
[im 236/330  mediastinal]
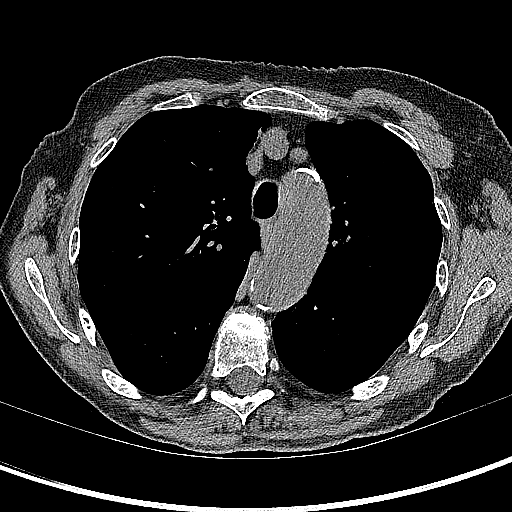
[im 236/330  lung]
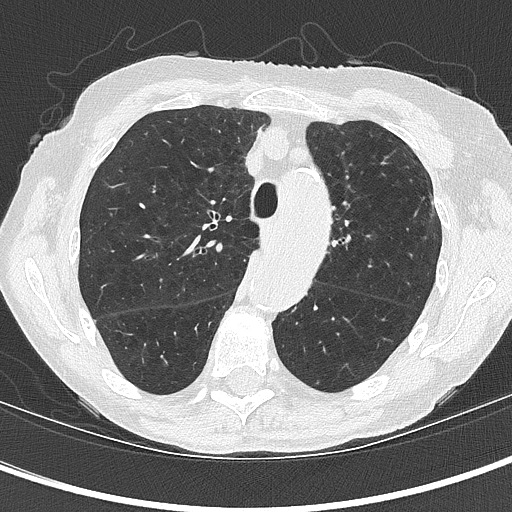
[im 251/330  lung]
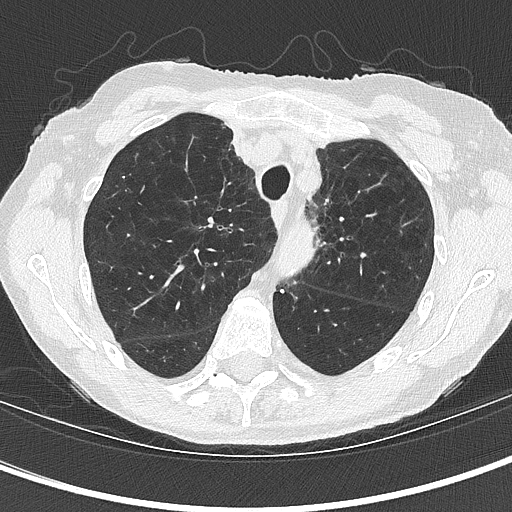
[im 283/330  lung]
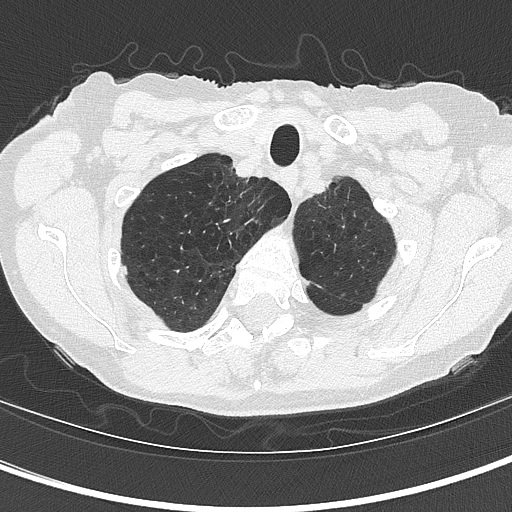
[im 314/330  lung]
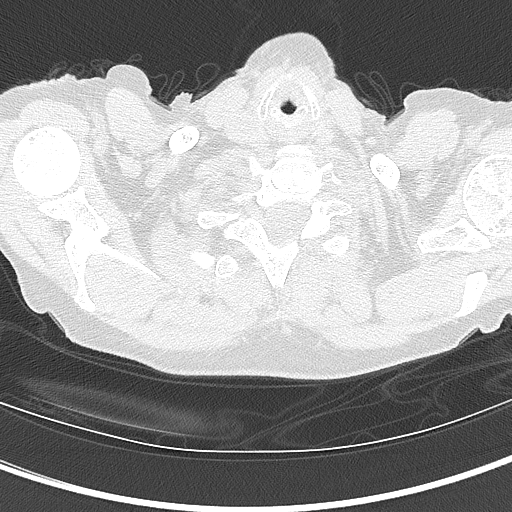

[Series 4: lcs f/u 1.00 cor · coronal · 0.52mm/px · 3 of 232 slices shown]
[im 47/232  lung]
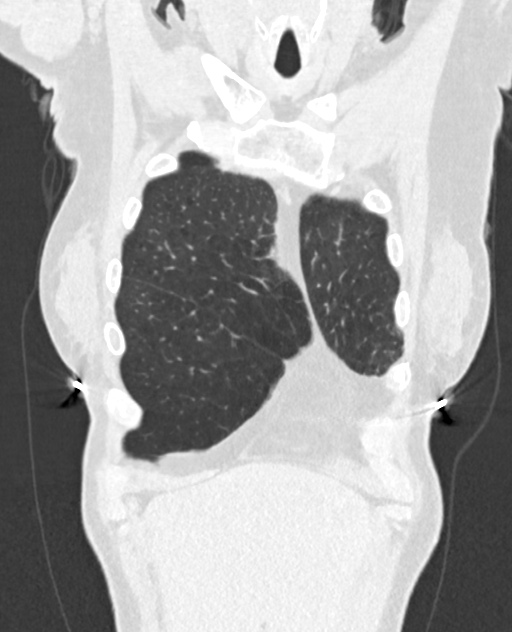
[im 93/232  lung]
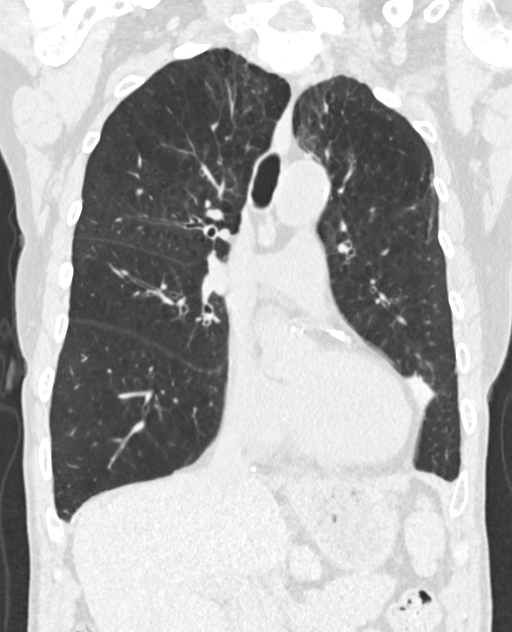
[im 139/232  lung]
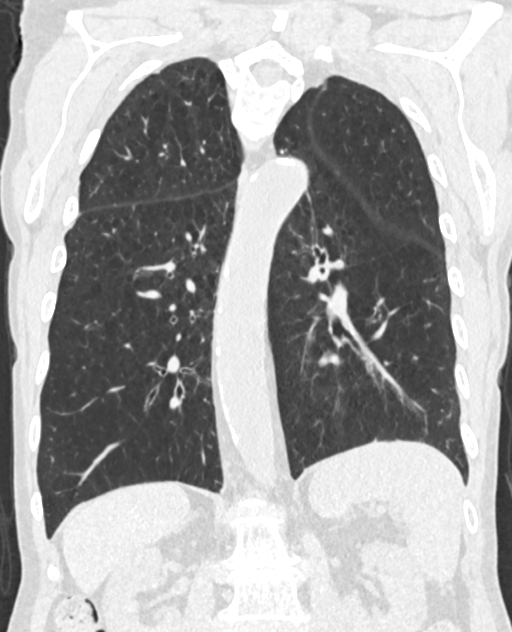

[15 of 40 positions shown; findings below may reference images not displayed]

FINDINGS: Cardiovascular: The heart size is normal. No substantial pericardial
effusion. Coronary artery calcification is evident. Atherosclerotic
calcification is noted in the wall of the thoracic aorta.

Mediastinum/Nodes: No mediastinal lymphadenopathy. No evidence for
gross hilar lymphadenopathy although assessment is limited by the
lack of intravenous contrast on today's study. The esophagus has
normal imaging features. There is no axillary lymphadenopathy.

Lungs/Pleura: Centrilobular and paraseptal emphysema evident. Left
apical nodule of concern on the previous study has decreased in the
interval measuring 4.9 mm today compared to 8.7 mm previously. A
second new left upper lobe pulmonary nodule identified on the
previous study on a background of patchy ground-glass opacity has
resolved in the interval. Scattered areas of architectural
distortion/scarring are noted bilaterally. Previously identified
scattered tiny calcified and noncalcified pulmonary nodules are
stable in the interval. No new suspicious pulmonary nodule or mass.
Subsegmental atelectasis posterior left upper lobe is mildly
progressive in the interval with chronic atelectasis or scarring in
the lingula similar to prior.

Upper Abdomen: 2 x 4 mm nonobstructing stone identified upper pole
right kidney.

Musculoskeletal: No worrisome lytic or sclerotic osseous
abnormality.
IMPRESSION: 1. Lung-RADS 2, benign appearance or behavior. Continue annual
screening with low-dose chest CT without contrast in 12 months.
2. The patchy ground-glass opacity identified as new on the prior
study has resolved in the interval compatible with
infectious/inflammatory etiology.
3. Aortic Atherosclerosis (V94BH-0QG.G) and Emphysema (V94BH-HKG.8).

## 2021-12-12 LAB — EXTERNAL GENERIC LAB PROCEDURE: COLOGUARD: NEGATIVE

## 2021-12-12 LAB — COLOGUARD: COLOGUARD: NEGATIVE

## 2021-12-29 ENCOUNTER — Other Ambulatory Visit: Payer: Self-pay | Admitting: Family Medicine

## 2021-12-29 DIAGNOSIS — Z1231 Encounter for screening mammogram for malignant neoplasm of breast: Secondary | ICD-10-CM

## 2022-01-12 ENCOUNTER — Other Ambulatory Visit: Payer: Self-pay | Admitting: *Deleted

## 2022-01-12 DIAGNOSIS — Z87891 Personal history of nicotine dependence: Secondary | ICD-10-CM

## 2022-01-24 ENCOUNTER — Ambulatory Visit: Payer: Medicare PPO

## 2022-02-02 ENCOUNTER — Ambulatory Visit
Admission: RE | Admit: 2022-02-02 | Discharge: 2022-02-02 | Disposition: A | Discharge status: Home / Self Care | Payer: Medicare PPO | Source: Ambulatory Visit | Attending: Acute Care | Admitting: Acute Care

## 2022-02-02 DIAGNOSIS — Z87891 Personal history of nicotine dependence: Secondary | ICD-10-CM | POA: Diagnosis present

## 2022-02-05 ENCOUNTER — Other Ambulatory Visit: Payer: Self-pay

## 2022-02-05 DIAGNOSIS — Z122 Encounter for screening for malignant neoplasm of respiratory organs: Secondary | ICD-10-CM

## 2022-02-05 DIAGNOSIS — Z87891 Personal history of nicotine dependence: Secondary | ICD-10-CM

## 2022-02-07 ENCOUNTER — Ambulatory Visit
Admission: RE | Admit: 2022-02-07 | Discharge: 2022-02-07 | Disposition: A | Discharge status: Home / Self Care | Payer: Medicare PPO | Source: Ambulatory Visit | Attending: Family Medicine | Admitting: Family Medicine

## 2022-02-07 DIAGNOSIS — Z1231 Encounter for screening mammogram for malignant neoplasm of breast: Secondary | ICD-10-CM | POA: Insufficient documentation

## 2022-12-02 ENCOUNTER — Inpatient Hospital Stay
Admission: EM | Admit: 2022-12-02 | Discharge: 2022-12-11 | DRG: 280 | Disposition: A | Discharge status: Home Health | Payer: Medicare PPO | Attending: Internal Medicine | Admitting: Internal Medicine

## 2022-12-02 ENCOUNTER — Emergency Department: Payer: Medicare PPO

## 2022-12-02 DIAGNOSIS — F419 Anxiety disorder, unspecified: Secondary | ICD-10-CM | POA: Diagnosis present

## 2022-12-02 DIAGNOSIS — J441 Chronic obstructive pulmonary disease with (acute) exacerbation: Secondary | ICD-10-CM | POA: Diagnosis present

## 2022-12-02 DIAGNOSIS — I252 Old myocardial infarction: Secondary | ICD-10-CM

## 2022-12-02 DIAGNOSIS — F418 Other specified anxiety disorders: Secondary | ICD-10-CM | POA: Diagnosis present

## 2022-12-02 DIAGNOSIS — E876 Hypokalemia: Secondary | ICD-10-CM | POA: Diagnosis present

## 2022-12-02 DIAGNOSIS — R748 Abnormal levels of other serum enzymes: Secondary | ICD-10-CM

## 2022-12-02 DIAGNOSIS — R0602 Shortness of breath: Secondary | ICD-10-CM | POA: Diagnosis not present

## 2022-12-02 DIAGNOSIS — R54 Age-related physical debility: Secondary | ICD-10-CM | POA: Diagnosis present

## 2022-12-02 DIAGNOSIS — I21A1 Myocardial infarction type 2: Secondary | ICD-10-CM | POA: Diagnosis present

## 2022-12-02 DIAGNOSIS — J9601 Acute respiratory failure with hypoxia: Secondary | ICD-10-CM | POA: Clinically undetermined

## 2022-12-02 DIAGNOSIS — Z800 Family history of malignant neoplasm of digestive organs: Secondary | ICD-10-CM

## 2022-12-02 DIAGNOSIS — R7989 Other specified abnormal findings of blood chemistry: Secondary | ICD-10-CM | POA: Diagnosis present

## 2022-12-02 DIAGNOSIS — R000 Tachycardia, unspecified: Secondary | ICD-10-CM | POA: Diagnosis present

## 2022-12-02 DIAGNOSIS — T502X5A Adverse effect of carbonic-anhydrase inhibitors, benzothiadiazides and other diuretics, initial encounter: Secondary | ICD-10-CM | POA: Diagnosis present

## 2022-12-02 DIAGNOSIS — I5021 Acute systolic (congestive) heart failure: Secondary | ICD-10-CM | POA: Diagnosis not present

## 2022-12-02 DIAGNOSIS — L8990 Pressure ulcer of unspecified site, unspecified stage: Secondary | ICD-10-CM | POA: Diagnosis present

## 2022-12-02 DIAGNOSIS — E860 Dehydration: Secondary | ICD-10-CM | POA: Diagnosis present

## 2022-12-02 DIAGNOSIS — J9611 Chronic respiratory failure with hypoxia: Secondary | ICD-10-CM | POA: Diagnosis present

## 2022-12-02 DIAGNOSIS — R110 Nausea: Secondary | ICD-10-CM | POA: Diagnosis not present

## 2022-12-02 DIAGNOSIS — Z7982 Long term (current) use of aspirin: Secondary | ICD-10-CM

## 2022-12-02 DIAGNOSIS — Z79899 Other long term (current) drug therapy: Secondary | ICD-10-CM

## 2022-12-02 DIAGNOSIS — E871 Hypo-osmolality and hyponatremia: Secondary | ICD-10-CM | POA: Diagnosis present

## 2022-12-02 DIAGNOSIS — I951 Orthostatic hypotension: Secondary | ICD-10-CM | POA: Clinically undetermined

## 2022-12-02 DIAGNOSIS — E873 Alkalosis: Secondary | ICD-10-CM | POA: Diagnosis not present

## 2022-12-02 DIAGNOSIS — Z87891 Personal history of nicotine dependence: Secondary | ICD-10-CM

## 2022-12-02 DIAGNOSIS — E785 Hyperlipidemia, unspecified: Secondary | ICD-10-CM | POA: Diagnosis present

## 2022-12-02 DIAGNOSIS — F32A Depression, unspecified: Secondary | ICD-10-CM | POA: Diagnosis present

## 2022-12-02 DIAGNOSIS — L89892 Pressure ulcer of other site, stage 2: Secondary | ICD-10-CM | POA: Diagnosis present

## 2022-12-02 DIAGNOSIS — E861 Hypovolemia: Secondary | ICD-10-CM | POA: Diagnosis present

## 2022-12-02 LAB — COMPREHENSIVE METABOLIC PANEL
ALT: 12 U/L (ref 0–44)
AST: 21 U/L (ref 15–41)
Albumin: 4.3 g/dL (ref 3.5–5.0)
Alkaline Phosphatase: 61 U/L (ref 38–126)
Anion gap: 11 (ref 5–15)
BUN: 21 mg/dL (ref 8–23)
CO2: 22 mmol/L (ref 22–32)
Calcium: 8.6 mg/dL — ABNORMAL LOW (ref 8.9–10.3)
Chloride: 100 mmol/L (ref 98–111)
Creatinine, Ser: 0.68 mg/dL (ref 0.44–1.00)
GFR, Estimated: >60 mL/min (ref >60)
Glucose, Bld: 156 mg/dL — ABNORMAL HIGH (ref 70–99)
Potassium: 4 mmol/L (ref 3.5–5.1)
Sodium: 133 mmol/L — ABNORMAL LOW (ref 135–145)
Total Bilirubin: 0.8 mg/dL (ref 0.3–1.2)
Total Protein: 7 g/dL (ref 6.5–8.1)

## 2022-12-02 LAB — TROPONIN I (HIGH SENSITIVITY): Troponin I (High Sensitivity): 133 ng/L (ref <18)

## 2022-12-02 LAB — CBC
HCT: 38.3 % (ref 36.0–46.0)
Hemoglobin: 13.4 g/dL (ref 12.0–15.0)
MCH: 31.6 pg (ref 26.0–34.0)
MCHC: 35 g/dL (ref 30.0–36.0)
MCV: 90.3 fL (ref 80.0–100.0)
Platelets: 219 10*3/uL (ref 150–400)
RBC: 4.24 MIL/uL (ref 3.87–5.11)
RDW: 12.7 % (ref 11.5–15.5)
WBC: 9.8 10*3/uL (ref 4.0–10.5)
nRBC: 0 % (ref 0.0–0.2)

## 2022-12-02 MED ORDER — IPRATROPIUM-ALBUTEROL 0.5-2.5 (3) MG/3ML IN SOLN
3.0000 mL | Freq: Once | RESPIRATORY_TRACT | Status: AC
  Administered 2022-12-02: 3 mL via RESPIRATORY_TRACT
  Filled 2022-12-02: qty 3

## 2022-12-02 MED ORDER — IOHEXOL 350 MG/ML SOLN
75.0000 mL | Freq: Once | INTRAVENOUS | Status: AC | PRN
  Administered 2022-12-02: 75 mL via INTRAVENOUS

## 2022-12-02 NOTE — H&P (Addendum)
History and Physical    Ashley Brooks UKG:[id number] DOB: [birth year] DOA: 12/02/2022  PCP: Juluis Pitch, MD  Patient coming from: home  I have personally briefly reviewed patient's old medical records in [place]  Chief Complaint: sob   HPI: Ashley Brooks is a 77 y.o. 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 IN the field patient was given 125mg  solumedrol 1 duo neb . Of note per EMS home noted to be littered with empty red wine bottles. Patient states SOB started acutely after she walked to laundry down the hallway and back. She noted no associated n/v/chest pain. She states episode felt like a flare of her copd. She notes no cough, fever/chills or recent uri. She notes currently after treatment in ED she feels much improved   ED Course:  Afeb, BP 134/78, HR 112, HR 109, RR30 sat 100% on  10L /min currently weaned 2L , baseline no O2 need  EKG: sinus tachycardia  nonspecific IVCD, nonspecific t wave abnormalities CTPA CE 133 prior value was 4 MPRESSION: No evidence of pulmonary embolus.   Coronary artery disease.   No acute cardiopulmonary disease.  Tx duoneb  Review of Systems: As per HPI otherwise 10 point review of systems negative.   Past Medical History:  Diagnosis Date   Anxiety    COPD (chronic obstructive pulmonary disease) ([place])    HLD (hyperlipidemia)    Personal history of tobacco use, presenting hazards to health 12/06/2015    Past Surgical History:  Procedure Laterality Date   CATARACT EXTRACTION     right foot fusion     TUBAL LIGATION       reports that she quit smoking about 4 years ago. She has a 42.00 pack-year smoking history. She has never used smokeless tobacco. She reports current alcohol use of about 2.0 standard drinks of alcohol per week. She reports that she does not use  drugs.  No Known Allergies  Family History  Problem Relation Age of Onset   Colon cancer Mother    Breast cancer Neg Hx     Prior to Admission medications   Medication Sig Start Date End Date Taking? Authorizing Provider  ALPRAZolam (XANAX) 0.25 MG tablet Take 0.25 mg by mouth 3 (three) times daily as needed for anxiety. 09/26/20   [provider]  aspirin 325 MG tablet Take 325 mg by mouth daily as needed for headache. Patient not taking: Reported on 09/30/2020    [provider]  FLUoxetine (PROZAC) 40 MG capsule Take 40 mg by mouth daily.     [provider]  ipratropium-albuterol (DUONEB) 0.5-2.5 (3) MG/3ML SOLN Take 3 mLs by nebulization every 2 (two) hours as needed. 01/02/18   Fritzi Mandes, MD  melatonin 3 MG TABS tablet Take 3 mg by mouth at bedtime.    [provider]  simvastatin (ZOCOR) 40 MG tablet Take 40 mg by mouth at bedtime.  08/28/16   [provider]  STIOLTO RESPIMAT 2.5-2.5 MCG/ACT AERS Inhale 2 puffs into the lungs daily. 09/23/20   [provider]  VENTOLIN HFA 108 (90 Base) MCG/ACT inhaler Inhale 2 puffs into the lungs every 4 (four) hours as needed for wheezing. 06/03/18   [provider]    Physical Exam: Vitals:   12/02/22 2127 12/02/22 2200 12/02/22 2215 12/02/22 2230  BP:  124/69  110/75  Pulse:  (!) 102 (!) 104 (!) 108  Resp:   (!) 27 (!) 23  Temp:      TempSrc:      SpO2:  100% 98% 98%  Weight: 45.8 kg     Height: 5' (1.524 m)       Constitutional: NAD, calm, comfortable Vitals:   12/02/22 2127 12/02/22 2200 12/02/22 2215 12/02/22 2230  BP:  124/69  110/75  Pulse:  (!) 102 (!) 104 (!) 108  Resp:   (!) 27 (!) 23  Temp:      TempSrc:      SpO2:  100% 98% 98%  Weight: 45.8 kg     Height: 5' (1.524 m)      Eyes: PERRL, lids and conjunctivae normal ENMT: Mucous membranes are moist. Posterior pharynx clear of any exudate or lesions.Normal dentition.  Neck: normal, supple, no masses, no  thyromegaly Respiratory: coarse bs bilaterally, no wheezing, faint crackles at bases. Normal respiratory effort. No accessory muscle use.  Cardiovascular: Regular rate and rhythm, no murmurs / rubs / gallops. No extremity edema. 2+ pedal pulses. Abdomen: no tenderness, no masses palpated. No hepatosplenomegaly. Bowel sounds positive.  Musculoskeletal: no clubbing / cyanosis. No joint deformity upper and lower extremities. Good ROM, no contractures. Normal muscle tone.  Skin: no rashes, lesions, ulcers. No induration Neurologic: CN 2-12 grossly intact. Sensation intact, . Strength 5/5 in all 4.  Psychiatric: Normal judgment and insight. Alert and oriented x 3. Normal mood.    Labs on Admission: I have personally reviewed following labs and imaging studies  CBC: Recent Labs  Lab 12/02/22 2131  WBC 9.8  HGB 13.4  HCT 38.3  MCV 90.3  PLT 001   Basic Metabolic Panel: Recent Labs  Lab 12/02/22 2131  NA 133*  K 4.0  CL 100  CO2 22  GLUCOSE 156*  BUN 21  CREATININE 0.68  CALCIUM 8.6*   GFR: Estimated Creatinine Clearance: 43 mL/min (by C-G formula based on SCr of 0.68 mg/dL). Liver Function Tests: Recent Labs  Lab 12/02/22 2131  AST 21  ALT 12  ALKPHOS 61  BILITOT 0.8  PROT 7.0  ALBUMIN 4.3   No results for input(s): "LIPASE", "AMYLASE" in the last 168 hours. No results for input(s): "AMMONIA" in the last 168 hours. Coagulation Profile: No results for input(s): "INR", "PROTIME" in the last 168 hours. Cardiac Enzymes: No results for input(s): "CKTOTAL", "CKMB", "CKMBINDEX", "TROPONINI" in the last 168 hours. BNP (last 3 results) No results for input(s): "PROBNP" in the last 8760 hours. HbA1C: No results for input(s): "HGBA1C" in the last 72 hours. CBG: No results for input(s): "GLUCAP" in the last 168 hours. Lipid Profile: No results for input(s): "CHOL", "HDL", "LDLCALC", "TRIG", "CHOLHDL", "LDLDIRECT" in the last 72 hours. Thyroid Function Tests: No results for  input(s): "TSH", "T4TOTAL", "FREET4", "T3FREE", "THYROIDAB" in the last 72 hours. Anemia Panel: No results for input(s): "VITAMINB12", "FOLATE", "FERRITIN", "TIBC", "IRON", "RETICCTPCT" in the last 72 hours. Urine analysis:    Component Value Date/Time   COLORURINE AMBER (A) 10/01/2020 1505   APPEARANCEUR CLOUDY (A) 10/01/2020 1505   LABSPEC 1.026 10/01/2020 1505   PHURINE 6.0 10/01/2020 1505   GLUCOSEU 150 (A) 10/01/2020 1505   HGBUR SMALL (A) 10/01/2020 1505   BILIRUBINUR NEGATIVE 10/01/2020 1505   KETONESUR 20 (A) 10/01/2020 1505   PROTEINUR 30 (A) 10/01/2020 1505   NITRITE NEGATIVE 10/01/2020 1505   LEUKOCYTESUR LARGE (A) 10/01/2020 1505  Radiological Exams on Admission: CT Angio Chest PE W and/or Wo Contrast  Result Date: 12/02/2022 CLINICAL DATA:  Pulmonary embolism (PE) suspected, high prob. Respiratory distress, shortness of breath EXAM: CT ANGIOGRAPHY CHEST WITH CONTRAST TECHNIQUE: Multidetector CT imaging of the chest was performed using the standard protocol during bolus administration of intravenous contrast. Multiplanar CT image reconstructions and MIPs were obtained to evaluate the vascular anatomy. RADIATION DOSE REDUCTION: This exam was performed according to the departmental dose-optimization program which includes automated exposure control, adjustment of the mA and/or kV according to patient size and/or use of iterative reconstruction technique. CONTRAST:  53mL OMNIPAQUE IOHEXOL 350 MG/ML SOLN COMPARISON:  02/02/2022 FINDINGS: Cardiovascular: No filling defects in the pulmonary arteries to suggest pulmonary emboli. Heart is normal size. Aorta is normal caliber. Aortic atherosclerosis and coronary artery calcifications. Mediastinum/Nodes: No mediastinal, hilar, or axillary adenopathy. Trachea and esophagus are unremarkable. Thyroid unremarkable. Lungs/Pleura: Centrilobular emphysema. No confluent opacities or effusions. Upper Abdomen: No acute findings Musculoskeletal: Chest  wall soft tissues are unremarkable. No acute bony abnormality. Review of the MIP images confirms the above findings. IMPRESSION: No evidence of pulmonary embolus. Coronary artery disease. No acute cardiopulmonary disease. Aortic Atherosclerosis (ICD10-I70.0) and Emphysema (ICD10-J43.9). Electronically Signed   By: Rolm Baptise M.D.   On: 12/02/2022 23:02   DG Chest Port 1 View  Result Date: 12/02/2022 CLINICAL DATA:  Shortness of breath EXAM: PORTABLE CHEST 1 VIEW COMPARISON:  11/18/2020 FINDINGS: Mild hyperinflation/COPD. Heart and mediastinal contours are within normal limits. No focal opacities or effusions. No acute bony abnormality. Aortic atherosclerosis. IMPRESSION: COPD.  No active disease. Electronically Signed   By: Rolm Baptise M.D.   On: 12/02/2022 22:10    EKG: Independently reviewed.   Assessment/Plan  Acute COPD exacerbation with acute hypoxic respiratory failure  -admit to progressive care  - solumedrol iv , bid  transition to po prednisone pre protocol -start  doxycycline -f/u on sputum cultures  -consider further imaging of chest if patient does not improve -cxr : NAD - nebs standing and prn  -resume chronic inhalers  -pulmonary toilet  -wean O2 as able      Abn CE with sob  presume type II NSTEMI  r/o TYPE I -presumed demand in setting of severe hypoxemia  -EKG with nonspecific findings -ce 133 was 4 prior  -await repeat  -asa  -lipid panel ,A1c in am  -echo pending  -if ce trend up may need to consider heparin -cardiology in am or sooner based on patient clinical course  HLD -resume statin (one med rec completed) -check lipids   Anxiety  -resume home regimen   Chronic hyponatremia -stable , due to SIADH  Tobacco abuse -in remission   EtOH use -per patient drinks wine daily  -place on ciwa  -she denies any episodes of w/d syndromes   DVT prophylaxis: heparin  Code Status: full/ as discussed per patient wishes in event of cardiac arrest  Family  Communication: . Disposition Plan: patient  expected to be admitted greater than 2 midnights  Consults called: cardiology  Admission status: progressive care    Clance Boll MD [facility]  If 7PM-7AM, please contact night-coverage [email or web address] Password [facility]  12/02/2022, 11:52 PM

## 2022-12-02 NOTE — ED Notes (Signed)
Per EDP v/o, placed O2 lowered to 2L [place] for trial

## 2022-12-02 NOTE — ED Provider Notes (Signed)
[facility] Provider Note    Event Date/Time   First MD Initiated Contact with Patient 12/02/22 2130     (approximate)   History   Shortness of Breath   HPI  Ashley Brooks is a 77 y.o. female with a history of COPD, anxiety who presents with complaints of shortness of breath.  EMS reportedly gave IV Solu-Medrol, DuoNeb with some improvement.  She denies chest pain.  No lower extremity swelling.  No fevers or cough.  She is not on home oxygen     Physical Exam   Triage Vital Signs: ED Triage Vitals  Enc Vitals Group     BP 12/02/22 2124 134/78     Pulse Rate 12/02/22 2124 (!) 112     Resp 12/02/22 2124 (!) 30     Temp 12/02/22 2124 97.6 F (36.4 C)     Temp Source 12/02/22 2124 Oral     SpO2 12/02/22 2123 100 %     Weight 12/02/22 2127 45.8 kg (100 lb 15.5 oz)     Height 12/02/22 2127 1.524 m (5')     Head Circumference --      Peak Flow --      Pain Score 12/02/22 2127 0     Pain Loc --      Pain Edu? --      Excl. in [place]? --     Most recent vital signs: Vitals:   12/02/22 2215 12/02/22 2230  BP:  110/75  Pulse: (!) 104 (!) 108  Resp: (!) 27 (!) 23  Temp:    SpO2: 98% 98%     General: Awake, no distress.  CV:  Good peripheral perfusion.  Resp:  Tachypnea with scattered wheezing Abd:  No distention.  Other:  No lower extremity swelling or pain   ED Results / Procedures / Treatments   Labs (all labs ordered are listed, but only abnormal results are displayed) Labs Reviewed  COMPREHENSIVE METABOLIC PANEL - Abnormal; Notable for the following components:      Result Value   Sodium 133 (*)    Glucose, Bld 156 (*)    Calcium 8.6 (*)    All other components within normal limits  TROPONIN I (HIGH SENSITIVITY) - Abnormal; Notable for the following components:   Troponin I (High Sensitivity) 133 (*)    All other components within normal limits  CBC     EKG  ED ECG REPORT I, Lavonia Drafts, the attending physician,  personally viewed and interpreted this ECG.  Date: 12/02/2022  Rhythm: Sinus tachycardia QRS Axis: normal Intervals: Not like IVCD ST/T Wave abnormalities: normal Narrative Interpretation: no evidence of acute ischemia    RADIOLOGY X-ray viewed interpreted by me, no pneumonia    PROCEDURES:  Critical Care performed: yes  CRITICAL CARE Performed by: Lavonia Drafts   Total critical care time: 30 minutes  Critical care time was exclusive of separately billable procedures and treating other patients.  Critical care was necessary to treat or prevent imminent or life-threatening deterioration.  Critical care was time spent personally by me on the following activities: development of treatment plan with patient and/or surrogate as well as nursing, discussions with consultants, evaluation of patient's response to treatment, examination of patient, obtaining history from patient or surrogate, ordering and performing treatments and interventions, ordering and review of laboratory studies, ordering and review of radiographic studies, pulse oximetry and re-evaluation of patient's condition.   Procedures   MEDICATIONS ORDERED IN ED: Medications  iohexol (OMNIPAQUE) 350 MG/ML injection 75 mL (has no administration in time range)  ipratropium-albuterol (DUONEB) 0.5-2.5 (3) MG/3ML nebulizer solution 3 mL (3 mLs Nebulization Given 12/02/22 2147)  ipratropium-albuterol (DUONEB) 0.5-2.5 (3) MG/3ML nebulizer solution 3 mL (3 mLs Nebulization Given 12/02/22 2147)     IMPRESSION / MDM / ASSESSMENT AND PLAN / ED COURSE  I reviewed the triage vital signs and the nursing notes. Patient's presentation is most consistent with severe exacerbation of chronic illness.  Patient presents with shortness of breath as detailed above peer differential includes COPD exacerbation, pneumonia, bronchitis, less likely CHF  Exam is most consistent with COPD, will give additional DuoNeb's, patient has received IV  Solu-Medrol.  Labs pending.  Chest x-ray without evidence of pneumonia.   ----------------------------------------- 10:31 PM on 12/02/2022 ----------------------------------------- Patient reports her breathing is much improved, notified by nurse of elevated troponin of 113, patient is not having any chest pain, question possible PE, will send for CT angiography  Have asked my colleague to follow-up on CT scan, patient will require admission        FINAL CLINICAL IMPRESSION(S) / ED DIAGNOSES   Final diagnoses:  COPD exacerbation ([place])  Elevated troponin     Rx / DC Orders   ED Discharge Orders     None        Note:  This document was prepared using Dragon voice recognition software and may include unintentional dictation errors.   Lavonia Drafts, MD 12/02/22 [phone]

## 2022-12-02 NOTE — ED Triage Notes (Signed)
Pt bib ems from home c/o resp distress. Hx COPD. Pt states she has been SOB all day, worsening tonight, and states she was so sob she couldn't get her at home breathing treatment together. Speaking 3 word sentences in triage. EMS gave 125mg  solumedrol and 1 duo ned given en route. EMS states several empty bottles of red wine near pt on their arrival. Pt also expressing concern for possible infection of R second toe. States has had drainage for approx 1 week.

## 2022-12-02 NOTE — ED Triage Notes (Signed)
Pt bib ems from home c/o resp distress. Hx COPD. Pt states she has been SOB all day, worsening tonight, and states she was so sob she couldn't get her at home breathing treatment together. Speaking 3 word sentences in triage. EMS gave 125mg  solumedrol and 1 duo ned given en route. EMS states several empty bottles of red wine near pt on their arrival.

## 2022-12-03 ENCOUNTER — Other Ambulatory Visit: Payer: Self-pay

## 2022-12-03 ENCOUNTER — Encounter: Payer: Self-pay | Admitting: Internal Medicine

## 2022-12-03 ENCOUNTER — Encounter
Admission: EM | Disposition: A | Discharge status: Home Health | Payer: Self-pay | Source: Home / Self Care | Attending: Internal Medicine

## 2022-12-03 ENCOUNTER — Inpatient Hospital Stay (HOSPITAL_COMMUNITY)
Admit: 2022-12-03 | Discharge: 2022-12-03 | Disposition: A | Discharge status: Home / Self Care | Payer: Medicare PPO | Attending: Internal Medicine | Admitting: Internal Medicine

## 2022-12-03 DIAGNOSIS — I214 Non-ST elevation (NSTEMI) myocardial infarction: Secondary | ICD-10-CM | POA: Diagnosis not present

## 2022-12-03 DIAGNOSIS — T502X5A Adverse effect of carbonic-anhydrase inhibitors, benzothiadiazides and other diuretics, initial encounter: Secondary | ICD-10-CM | POA: Diagnosis present

## 2022-12-03 DIAGNOSIS — R7989 Other specified abnormal findings of blood chemistry: Secondary | ICD-10-CM

## 2022-12-03 DIAGNOSIS — I21A1 Myocardial infarction type 2: Secondary | ICD-10-CM | POA: Diagnosis present

## 2022-12-03 DIAGNOSIS — F32A Depression, unspecified: Secondary | ICD-10-CM | POA: Diagnosis present

## 2022-12-03 DIAGNOSIS — E871 Hypo-osmolality and hyponatremia: Secondary | ICD-10-CM | POA: Diagnosis present

## 2022-12-03 DIAGNOSIS — I5021 Acute systolic (congestive) heart failure: Secondary | ICD-10-CM | POA: Diagnosis present

## 2022-12-03 DIAGNOSIS — I429 Cardiomyopathy, unspecified: Secondary | ICD-10-CM | POA: Diagnosis not present

## 2022-12-03 DIAGNOSIS — Z7982 Long term (current) use of aspirin: Secondary | ICD-10-CM | POA: Diagnosis not present

## 2022-12-03 DIAGNOSIS — Z79899 Other long term (current) drug therapy: Secondary | ICD-10-CM | POA: Diagnosis not present

## 2022-12-03 DIAGNOSIS — R54 Age-related physical debility: Secondary | ICD-10-CM | POA: Diagnosis present

## 2022-12-03 DIAGNOSIS — E785 Hyperlipidemia, unspecified: Secondary | ICD-10-CM | POA: Diagnosis present

## 2022-12-03 DIAGNOSIS — R000 Tachycardia, unspecified: Secondary | ICD-10-CM | POA: Diagnosis present

## 2022-12-03 DIAGNOSIS — Z800 Family history of malignant neoplasm of digestive organs: Secondary | ICD-10-CM | POA: Diagnosis not present

## 2022-12-03 DIAGNOSIS — J441 Chronic obstructive pulmonary disease with (acute) exacerbation: Secondary | ICD-10-CM | POA: Diagnosis present

## 2022-12-03 DIAGNOSIS — E861 Hypovolemia: Secondary | ICD-10-CM | POA: Diagnosis present

## 2022-12-03 DIAGNOSIS — L89892 Pressure ulcer of other site, stage 2: Secondary | ICD-10-CM | POA: Diagnosis present

## 2022-12-03 DIAGNOSIS — R0602 Shortness of breath: Secondary | ICD-10-CM | POA: Diagnosis present

## 2022-12-03 DIAGNOSIS — E873 Alkalosis: Secondary | ICD-10-CM | POA: Diagnosis not present

## 2022-12-03 DIAGNOSIS — R0609 Other forms of dyspnea: Secondary | ICD-10-CM | POA: Diagnosis not present

## 2022-12-03 DIAGNOSIS — R110 Nausea: Secondary | ICD-10-CM | POA: Diagnosis not present

## 2022-12-03 DIAGNOSIS — E860 Dehydration: Secondary | ICD-10-CM | POA: Diagnosis present

## 2022-12-03 DIAGNOSIS — E876 Hypokalemia: Secondary | ICD-10-CM | POA: Diagnosis not present

## 2022-12-03 DIAGNOSIS — J9601 Acute respiratory failure with hypoxia: Secondary | ICD-10-CM | POA: Diagnosis present

## 2022-12-03 DIAGNOSIS — I951 Orthostatic hypotension: Secondary | ICD-10-CM | POA: Diagnosis not present

## 2022-12-03 DIAGNOSIS — I252 Old myocardial infarction: Secondary | ICD-10-CM | POA: Diagnosis not present

## 2022-12-03 DIAGNOSIS — Z87891 Personal history of nicotine dependence: Secondary | ICD-10-CM | POA: Diagnosis not present

## 2022-12-03 DIAGNOSIS — F419 Anxiety disorder, unspecified: Secondary | ICD-10-CM | POA: Diagnosis present

## 2022-12-03 HISTORY — DX: Other specified abnormal findings of blood chemistry: R79.89

## 2022-12-03 HISTORY — PX: LEFT HEART CATH AND CORONARY ANGIOGRAPHY: CATH118249

## 2022-12-03 LAB — ECHOCARDIOGRAM COMPLETE
AR max vel: 2.19 cm2
AV Area VTI: 2.31 cm2
AV Area mean vel: 2.04 cm2
AV Mean grad: 3 mmHg
AV Peak grad: 6.3 mmHg
Ao pk vel: 1.25 m/s
Area-P 1/2: 7.82 cm2
Height: 60 in
MV VTI: 3.07 cm2
S' Lateral: 3.1 cm
Weight: 1615.53 oz

## 2022-12-03 LAB — COMPREHENSIVE METABOLIC PANEL
ALT: 13 U/L (ref 0–44)
AST: 23 U/L (ref 15–41)
Albumin: 4.3 g/dL (ref 3.5–5.0)
Alkaline Phosphatase: 61 U/L (ref 38–126)
Anion gap: 11 (ref 5–15)
BUN: 21 mg/dL (ref 8–23)
CO2: 22 mmol/L (ref 22–32)
Calcium: 8.8 mg/dL — ABNORMAL LOW (ref 8.9–10.3)
Chloride: 100 mmol/L (ref 98–111)
Creatinine, Ser: 0.63 mg/dL (ref 0.44–1.00)
GFR, Estimated: >60 mL/min (ref >60)
Glucose, Bld: 149 mg/dL — ABNORMAL HIGH (ref 70–99)
Potassium: 4.5 mmol/L (ref 3.5–5.1)
Sodium: 133 mmol/L — ABNORMAL LOW (ref 135–145)
Total Bilirubin: 0.9 mg/dL (ref 0.3–1.2)
Total Protein: 7 g/dL (ref 6.5–8.1)

## 2022-12-03 LAB — CBC
HCT: 37 % (ref 36.0–46.0)
Hemoglobin: 13 g/dL (ref 12.0–15.0)
MCH: 31.7 pg (ref 26.0–34.0)
MCHC: 35.1 g/dL (ref 30.0–36.0)
MCV: 90.2 fL (ref 80.0–100.0)
Platelets: 157 10*3/uL (ref 150–400)
RBC: 4.1 MIL/uL (ref 3.87–5.11)
RDW: 12.7 % (ref 11.5–15.5)
WBC: 6.6 10*3/uL (ref 4.0–10.5)
nRBC: 0 % (ref 0.0–0.2)

## 2022-12-03 LAB — GLUCOSE, CAPILLARY: Glucose-Capillary: 142 mg/dL — ABNORMAL HIGH (ref 70–99)

## 2022-12-03 LAB — APTT: aPTT: 26 seconds (ref 24–36)

## 2022-12-03 LAB — PROTIME-INR
INR: 1.1 (ref 0.8–1.2)
Prothrombin Time: 13.7 seconds (ref 11.4–15.2)

## 2022-12-03 LAB — LIPID PANEL
Cholesterol: 169 mg/dL (ref 0–200)
HDL: 78 mg/dL (ref >40)
LDL Cholesterol: 84 mg/dL (ref 0–99)
Total CHOL/HDL Ratio: 2.2 RATIO
Triglycerides: 36 mg/dL (ref <150)
VLDL: 7 mg/dL (ref 0–40)

## 2022-12-03 LAB — CBG MONITORING, ED: Glucose-Capillary: 138 mg/dL — ABNORMAL HIGH (ref 70–99)

## 2022-12-03 LAB — HEMOGLOBIN A1C
Hgb A1c MFr Bld: 4.5 % — ABNORMAL LOW (ref 4.8–5.6)
Mean Plasma Glucose: 82.45 mg/dL

## 2022-12-03 LAB — TSH: TSH: 0.802 u[IU]/mL (ref 0.350–4.500)

## 2022-12-03 LAB — HEPARIN LEVEL (UNFRACTIONATED): Heparin Unfractionated: 0.81 IU/mL — ABNORMAL HIGH (ref 0.30–0.70)

## 2022-12-03 LAB — TROPONIN I (HIGH SENSITIVITY)
Troponin I (High Sensitivity): 1256 ng/L (ref <18)
Troponin I (High Sensitivity): 1393 ng/L (ref <18)
Troponin I (High Sensitivity): 1274 ng/L (ref <18)

## 2022-12-03 LAB — BRAIN NATRIURETIC PEPTIDE: B Natriuretic Peptide: 133.8 pg/mL — ABNORMAL HIGH (ref 0.0–100.0)

## 2022-12-03 SURGERY — LEFT HEART CATH AND CORONARY ANGIOGRAPHY
Anesthesia: Moderate Sedation | Laterality: Right

## 2022-12-03 MED ORDER — ALPRAZOLAM 0.25 MG PO TABS
0.2500 mg | ORAL_TABLET | Freq: Three times a day (TID) | ORAL | Status: DC | PRN
  Administered 2022-12-03 – 2022-12-09 (×3): 0.25 mg via ORAL
  Filled 2022-12-03 (×3): qty 1

## 2022-12-03 MED ORDER — UMECLIDINIUM BROMIDE 62.5 MCG/ACT IN AEPB
1.0000 | INHALATION_SPRAY | Freq: Every day | RESPIRATORY_TRACT | Status: DC
  Administered 2022-12-04 – 2022-12-11 (×8): 1 via RESPIRATORY_TRACT
  Filled 2022-12-03 (×2): qty 7

## 2022-12-03 MED ORDER — SODIUM CHLORIDE 0.9% FLUSH
3.0000 mL | Freq: Two times a day (BID) | INTRAVENOUS | Status: DC
  Administered 2022-12-04 – 2022-12-06 (×5): 3 mL via INTRAVENOUS

## 2022-12-03 MED ORDER — SODIUM CHLORIDE 0.9 % IV SOLN: 250.0000 mL | INTRAVENOUS | Status: DC | PRN

## 2022-12-03 MED ORDER — SODIUM CHLORIDE 0.9% FLUSH: 3.0000 mL | INTRAVENOUS | Status: DC | PRN

## 2022-12-03 MED ORDER — MIDAZOLAM HCL 2 MG/2ML IJ SOLN
INTRAMUSCULAR | Status: AC
  Filled 2022-12-03: qty 2

## 2022-12-03 MED ORDER — IOHEXOL 300 MG/ML  SOLN
INTRAMUSCULAR | Status: DC | PRN
  Administered 2022-12-03: 65 mL

## 2022-12-03 MED ORDER — PNEUMOCOCCAL 20-VAL CONJ VACC 0.5 ML IM SUSY
0.5000 mL | PREFILLED_SYRINGE | INTRAMUSCULAR | Status: DC
  Filled 2022-12-03: qty 0.5

## 2022-12-03 MED ORDER — PREDNISONE 20 MG PO TABS
40.0000 mg | ORAL_TABLET | Freq: Every day | ORAL | Status: AC
  Administered 2022-12-04 – 2022-12-07 (×4): 40 mg via ORAL
  Filled 2022-12-03 (×4): qty 2

## 2022-12-03 MED ORDER — IPRATROPIUM-ALBUTEROL 0.5-2.5 (3) MG/3ML IN SOLN
RESPIRATORY_TRACT | Status: AC
  Administered 2022-12-03: 3 mL via RESPIRATORY_TRACT
  Filled 2022-12-03: qty 3

## 2022-12-03 MED ORDER — FENTANYL CITRATE (PF) 100 MCG/2ML IJ SOLN
INTRAMUSCULAR | Status: AC
  Filled 2022-12-03: qty 2

## 2022-12-03 MED ORDER — SODIUM CHLORIDE 0.9% FLUSH
3.0000 mL | Freq: Two times a day (BID) | INTRAVENOUS | Status: DC
  Administered 2022-12-03 – 2022-12-10 (×11): 3 mL via INTRAVENOUS

## 2022-12-03 MED ORDER — PERFLUTREN LIPID MICROSPHERE
1.0000 mL | INTRAVENOUS | Status: AC | PRN
  Administered 2022-12-03: 5 mL via INTRAVENOUS

## 2022-12-03 MED ORDER — HYDRALAZINE HCL 20 MG/ML IJ SOLN: 10.0000 mg | INTRAMUSCULAR | Status: AC | PRN

## 2022-12-03 MED ORDER — SODIUM CHLORIDE 0.9 % WEIGHT BASED INFUSION: 1.0000 mL/kg/h | INTRAVENOUS | Status: AC

## 2022-12-03 MED ORDER — ASPIRIN 81 MG PO CHEW
81.0000 mg | CHEWABLE_TABLET | Freq: Every day | ORAL | Status: DC
  Administered 2022-12-04 – 2022-12-11 (×8): 81 mg via ORAL
  Filled 2022-12-03 (×8): qty 1

## 2022-12-03 MED ORDER — ACETAMINOPHEN 325 MG PO TABS: 650.0000 mg | ORAL_TABLET | Freq: Four times a day (QID) | ORAL | Status: DC | PRN

## 2022-12-03 MED ORDER — SODIUM CHLORIDE 0.9 % IV SOLN: INTRAVENOUS | Status: DC

## 2022-12-03 MED ORDER — IPRATROPIUM-ALBUTEROL 0.5-2.5 (3) MG/3ML IN SOLN
3.0000 mL | RESPIRATORY_TRACT | Status: DC | PRN
  Administered 2022-12-03: 3 mL via RESPIRATORY_TRACT
  Filled 2022-12-03: qty 3

## 2022-12-03 MED ORDER — ALBUTEROL SULFATE (2.5 MG/3ML) 0.083% IN NEBU
INHALATION_SOLUTION | RESPIRATORY_TRACT | Status: AC
  Filled 2022-12-03: qty 6

## 2022-12-03 MED ORDER — DIGOXIN 0.25 MG/ML IJ SOLN: 0.5000 mg | Freq: Once | INTRAMUSCULAR | Status: DC

## 2022-12-03 MED ORDER — SPIRONOLACTONE 12.5 MG HALF TABLET
12.5000 mg | ORAL_TABLET | Freq: Every day | ORAL | Status: DC
  Administered 2022-12-03 – 2022-12-11 (×9): 12.5 mg via ORAL
  Filled 2022-12-03 (×9): qty 1

## 2022-12-03 MED ORDER — ASPIRIN 81 MG PO CHEW: 81.0000 mg | CHEWABLE_TABLET | ORAL | Status: DC

## 2022-12-03 MED ORDER — HEPARIN BOLUS VIA INFUSION
2800.0000 [IU] | Freq: Once | INTRAVENOUS | Status: AC
  Administered 2022-12-03: 2800 [IU] via INTRAVENOUS
  Filled 2022-12-03: qty 2800

## 2022-12-03 MED ORDER — ACETAMINOPHEN 325 MG PO TABS: 650.0000 mg | ORAL_TABLET | ORAL | Status: DC | PRN

## 2022-12-03 MED ORDER — ONDANSETRON HCL 4 MG/2ML IJ SOLN
4.0000 mg | Freq: Four times a day (QID) | INTRAMUSCULAR | Status: DC | PRN
  Administered 2022-12-04: 4 mg via INTRAVENOUS
  Filled 2022-12-03: qty 2

## 2022-12-03 MED ORDER — ASPIRIN 325 MG PO TABS
325.0000 mg | ORAL_TABLET | Freq: Once | ORAL | Status: AC
  Administered 2022-12-03: 325 mg via ORAL
  Filled 2022-12-03: qty 1

## 2022-12-03 MED ORDER — FUROSEMIDE 10 MG/ML IJ SOLN
4.0000 mg/h | INTRAVENOUS | Status: DC
  Administered 2022-12-03: 4 mg/h via INTRAVENOUS
  Filled 2022-12-03: qty 20

## 2022-12-03 MED ORDER — ACETAMINOPHEN 325 MG RE SUPP: 650.0000 mg | Freq: Four times a day (QID) | RECTAL | Status: DC | PRN

## 2022-12-03 MED ORDER — ATORVASTATIN CALCIUM 20 MG PO TABS
40.0000 mg | ORAL_TABLET | Freq: Every day | ORAL | Status: DC
  Administered 2022-12-03 – 2022-12-11 (×9): 40 mg via ORAL
  Filled 2022-12-03 (×9): qty 2

## 2022-12-03 MED ORDER — METHYLPREDNISOLONE SODIUM SUCC 40 MG IJ SOLR
40.0000 mg | Freq: Two times a day (BID) | INTRAMUSCULAR | Status: AC
  Administered 2022-12-03 (×2): 40 mg via INTRAVENOUS
  Filled 2022-12-03 (×2): qty 1

## 2022-12-03 MED ORDER — ARFORMOTEROL TARTRATE 15 MCG/2ML IN NEBU
15.0000 ug | INHALATION_SOLUTION | Freq: Two times a day (BID) | RESPIRATORY_TRACT | Status: DC
  Administered 2022-12-03 – 2022-12-11 (×15): 15 ug via RESPIRATORY_TRACT
  Filled 2022-12-03 (×17): qty 2

## 2022-12-03 MED ORDER — LABETALOL HCL 5 MG/ML IV SOLN: 10.0000 mg | INTRAVENOUS | Status: AC | PRN

## 2022-12-03 MED ORDER — MUPIROCIN 2 % EX OINT
1.0000 | TOPICAL_OINTMENT | Freq: Every day | CUTANEOUS | Status: DC
  Administered 2022-12-03 – 2022-12-11 (×8): 1 via TOPICAL
  Filled 2022-12-03 (×2): qty 22

## 2022-12-03 MED ORDER — HEPARIN (PORCINE) 25000 UT/250ML-% IV SOLN
550.0000 [IU]/h | INTRAVENOUS | Status: DC
  Administered 2022-12-03: 550 [IU]/h via INTRAVENOUS
  Filled 2022-12-03: qty 250

## 2022-12-03 MED ORDER — METOPROLOL SUCCINATE ER 25 MG PO TB24
25.0000 mg | ORAL_TABLET | Freq: Every day | ORAL | Status: DC
  Administered 2022-12-04 – 2022-12-06 (×3): 25 mg via ORAL
  Filled 2022-12-03 (×3): qty 1

## 2022-12-03 MED ORDER — HEPARIN SODIUM (PORCINE) 5000 UNIT/ML IJ SOLN
5000.0000 [IU] | Freq: Three times a day (TID) | INTRAMUSCULAR | Status: DC
  Filled 2022-12-03: qty 1

## 2022-12-03 MED ORDER — DOXYCYCLINE HYCLATE 100 MG PO TABS
100.0000 mg | ORAL_TABLET | Freq: Two times a day (BID) | ORAL | Status: AC
  Administered 2022-12-03 – 2022-12-07 (×9): 100 mg via ORAL
  Filled 2022-12-03 (×9): qty 1

## 2022-12-03 SURGICAL SUPPLY — 11 items
CATH INFINITI 5FR MULTPACK ANG (CATHETERS) IMPLANT
DEVICE CLOSURE MYNXGRIP 5F (Vascular Products) IMPLANT
KIT SYRINGE INJ CVI SPIKEX1 (MISCELLANEOUS) IMPLANT
NDL PERC 18GX7CM (NEEDLE) IMPLANT
NEEDLE PERC 18GX7CM (NEEDLE) ×1 IMPLANT
PACK CARDIAC CATH (CUSTOM PROCEDURE TRAY) ×1 IMPLANT
PROTECTION STATION PRESSURIZED (MISCELLANEOUS) ×1
SET ATX SIMPLICITY (MISCELLANEOUS) IMPLANT
SHEATH AVANTI 5FR X 11CM (SHEATH) IMPLANT
STATION PROTECTION PRESSURIZED (MISCELLANEOUS) IMPLANT
WIRE GUIDERIGHT .035X150 (WIRE) IMPLANT

## 2022-12-03 NOTE — Progress Notes (Signed)
Patient has no significant CAD but has LVEF 20% and very tachycardic. Thus have HErEF, and start metoprolol, digoxin, aldactone and lasix drip. Put foley in.

## 2022-12-03 NOTE — Consult Note (Addendum)
[facility] Nurse Consult Note: Reason for Consult: Consult requested for toe. Right 2nd toe with 2 separated dry scabbed full thickness wounds, 20% red, 80% black, no odor, drainage or fluctuance.  .5X.5cm and .3X.3cm.  Dressing procedure/placement/frequency: Topical treatment order provided for bedside nurses to perform as follows to promote moist healing: Apply Bactroban to right 2nd toe Q day, then cover with foam dressing.  (Change foam dressing Q 3 days or PRN soiling.) Please re-consult if further assistance is needed.  Thank-you,  Julien Girt MSN, [place], [place], [place], [place]

## 2022-12-03 NOTE — Progress Notes (Signed)
OT Cancellation Note  Patient Details Name: Ashley Brooks MRN: [id number] DOB: 09-12-1946   Cancelled Treatment:    Reason Eval/Treat Not Completed: Patient not medically ready. OT orders received, chart reviewed. Pt with elevated troponin. Pt noted to have been ruled in for NSTEMI and has cardiology consult placed. Will re-attempt OT evaluation as pt medically appropriate.  Lanelle Bal  [facility] [place] 12/03/2022, 8:40 AM

## 2022-12-03 NOTE — ED Provider Notes (Signed)
Vitals:   12/02/22 2215 12/02/22 2230  BP:  110/75  Pulse: (!) 104 (!) 108  Resp: (!) 27 (!) 23  Temp:    SpO2: 98% 98%     Patient resting comfortably.  Reports her breathing is much improved.  She is in no distress.  Denies having any chest pain.  Her first troponin is notably elevated.   CT Angio Chest PE W and/or Wo Contrast  Result Date: 12/02/2022 CLINICAL DATA:  Pulmonary embolism (PE) suspected, high prob. Respiratory distress, shortness of breath EXAM: CT ANGIOGRAPHY CHEST WITH CONTRAST TECHNIQUE: Multidetector CT imaging of the chest was performed using the standard protocol during bolus administration of intravenous contrast. Multiplanar CT image reconstructions and MIPs were obtained to evaluate the vascular anatomy. RADIATION DOSE REDUCTION: This exam was performed according to the departmental dose-optimization program which includes automated exposure control, adjustment of the mA and/or kV according to patient size and/or use of iterative reconstruction technique. CONTRAST:  13mL OMNIPAQUE IOHEXOL 350 MG/ML SOLN COMPARISON:  02/02/2022 FINDINGS: Cardiovascular: No filling defects in the pulmonary arteries to suggest pulmonary emboli. Heart is normal size. Aorta is normal caliber. Aortic atherosclerosis and coronary artery calcifications. Mediastinum/Nodes: No mediastinal, hilar, or axillary adenopathy. Trachea and esophagus are unremarkable. Thyroid unremarkable. Lungs/Pleura: Centrilobular emphysema. No confluent opacities or effusions. Upper Abdomen: No acute findings Musculoskeletal: Chest wall soft tissues are unremarkable. No acute bony abnormality. Review of the MIP images confirms the above findings. IMPRESSION: No evidence of pulmonary embolus. Coronary artery disease. No acute cardiopulmonary disease. Aortic Atherosclerosis (ICD10-I70.0) and Emphysema (ICD10-J43.9). Electronically Signed   By: Rolm Baptise M.D.   On: 12/02/2022 23:02   DG Chest Port 1 View  Result Date:  12/02/2022 CLINICAL DATA:  Shortness of breath EXAM: PORTABLE CHEST 1 VIEW COMPARISON:  11/18/2020 FINDINGS: Mild hyperinflation/COPD. Heart and mediastinal contours are within normal limits. No focal opacities or effusions. No acute bony abnormality. Aortic atherosclerosis. IMPRESSION: COPD.  No active disease. Electronically Signed   By: Rolm Baptise M.D.   On: 12/02/2022 22:10     Suspect demand ischemia.  No evidence of pulmonary embolism.  Coronary artery disease is noted on her CT of the chest.  Her EKG does not demonstrate any findings would be concerning for acute STEMI she is currently pain-free.  I consulted with the hospitalist, they have accepted her onto their service but requested to have a second troponin result before they would actually perform admission.   Delman Kitten, MD 12/03/22 [phone]

## 2022-12-03 NOTE — ED Notes (Signed)
Purewick placed after pt stating she had to urinate. Pt concerned about her breathing but comfortable in bed at this time. RN made aware by this tech.

## 2022-12-03 NOTE — Progress Notes (Signed)
PT Cancellation Note  Patient Details Name: Ashley Brooks MRN: [id number] DOB: 15-Jun-1946   Cancelled Treatment:    Reason Eval/Treat Not Completed: Patient not medically ready PT orders received, chart reviewed. Pt noted to have been ruled in for NSTEMI & with cardiology consult placed. Pt not medically ready for PT evaluation at this time. Will f/u as able.  Lavone Nian, PT, DPT 12/03/22, 8:36 AM   [name] 12/03/2022, 8:35 AM

## 2022-12-03 NOTE — ED Notes (Signed)
Informed RN bed assigned 

## 2022-12-03 NOTE — Progress Notes (Addendum)
Patient second CE resulted now 1256  Patient has ruled in for NSTEMI  -will start on heparin  -echo ordered  -cardiology consulted [place] -will continue to cycle ce

## 2022-12-03 NOTE — Consult Note (Signed)
ANTICOAGULATION CONSULT NOTE - Initial Consult  Pharmacy Consult for heparin infusion Indication: chest pain/ACS  No Known Allergies  Patient Measurements: Height: 5' (152.4 cm) Weight: 45.8 kg (100 lb 15.5 oz) IBW/kg (Calculated) : 45.5 Heparin Dosing Weight: 45.8 kg  Vital Signs: Temp: 97.6 F (36.4 C) (02/04 2124) Temp Source: Oral (02/04 2124) BP: 110/75 (02/04 2230) Pulse Rate: 108 (02/04 2230)  Labs: Recent Labs    12/02/22 2131 12/02/22 2341  HGB 13.4  --   HCT 38.3  --   PLT 219  --   CREATININE 0.68  --   TROPONINIHS 133* 1,256*    Estimated Creatinine Clearance: 43 mL/min (by C-G formula based on SCr of 0.68 mg/dL).   Medical History: Past Medical History:  Diagnosis Date   Anxiety    COPD (chronic obstructive pulmonary disease) ([place])    HLD (hyperlipidemia)    Personal history of tobacco use, presenting hazards to health 12/06/2015    Medications:  No previous anticoagulation noted   Assessment:  77 y.o. female with medical history significant of  HLD, Anxiety presented to ED with SOB x1 day that started acutely after walking down hallway and back. Troponin I 133 > 1256. Pharmacy has been consulted to initiate and manage IV heparin infusion for NSTEMI  Goal of Therapy:  Heparin level 0.3-0.7 units/ml Monitor platelets by anticoagulation protocol: Yes   Plan:  Give 2800 units bolus x 1 Start heparin infusion at 550 units/hr Check anti-Xa level in 8 hours and daily while on heparin Continue to monitor H&H and platelets  Dorothe Pea, PharmD, BCPS Clinical Pharmacist   12/03/2022,1:51 AM

## 2022-12-03 NOTE — Consult Note (Signed)
Ashley Brooks is a 77 y.o. female  [id number]  Primary Cardiologist: Adrian Blackwater Reason for Consultation: NSTEMI  HPI: [id number] with nstemi, presented with SOB, and troponin was elevated.   Review of Systems: No chest pain   Past Medical History:  Diagnosis Date   Anxiety    COPD (chronic obstructive pulmonary disease) (HCC)    HLD (hyperlipidemia)    Personal history of tobacco use, presenting hazards to health 12/06/2015    (Not in a hospital admission)     arformoterol  15 mcg Nebulization BID   And   umeclidinium bromide  1 puff Inhalation Daily   [START ON 12/04/2022] aspirin  81 mg Oral Daily   [START ON 12/04/2022] aspirin  81 mg Oral Pre-Cath   atorvastatin  40 mg Oral Daily   doxycycline  100 mg Oral Q12H   methylPREDNISolone (SOLU-MEDROL) injection  40 mg Intravenous Q12H   Followed by   Melene Muller ON 12/04/2022] predniSONE  40 mg Oral Q breakfast   sodium chloride flush  3 mL Intravenous Q12H    Infusions:  sodium chloride     sodium chloride     heparin 550 Units/hr (12/03/22 0222)    No Known Allergies  Social History   Socioeconomic History   Marital status: Widowed    Spouse name: Not on file   Number of children: Not on file   Years of education: Not on file   Highest education level: Not on file  Occupational History   Occupation: retired  Tobacco Use   Smoking status: Former    Packs/day: 1.00    Years: 42.00    Total pack years: 42.00    Types: Cigarettes    Quit date: 12/26/2017    Years since quitting: 4.9   Smokeless tobacco: Never  Vaping Use   Vaping Use: Never used  Substance and Sexual Activity   Alcohol use: Yes    Alcohol/week: 2.0 standard drinks of alcohol    Types: 2 Glasses of wine per week    Comment: 2 drinks/ day   Drug use: No   Sexual activity: Never  Other Topics Concern   Not on file  Social History Narrative   Not on file   Social Determinants of Health   Financial Resource Strain: Not on file  Food  Insecurity: No Food Insecurity (12/03/2022)   Hunger Vital Sign    Worried About Running Out of Food in the Last Year: Never true    Ran Out of Food in the Last Year: Never true  Transportation Needs: No Transportation Needs (12/03/2022)   PRAPARE - Administrator, Civil Service (Medical): No    Lack of Transportation (Non-Medical): No  Physical Activity: Not on file  Stress: Not on file  Social Connections: Not on file  Intimate Partner Violence: Not At Risk (12/03/2022)   Humiliation, Afraid, Rape, and Kick questionnaire    Fear of Current or Ex-Partner: No    Emotionally Abused: No    Physically Abused: No    Sexually Abused: No    Family History  Problem Relation Age of Onset   Colon cancer Mother    Breast cancer Neg Hx     PHYSICAL EXAM: Vitals:   12/03/22 0700 12/03/22 0800  BP: 102/75 (!) 148/65  Pulse: 85 (!) 102  Resp: (!) 21 19  Temp:    SpO2: 100% 99%    No intake or output data in the 24 hours ending 12/03/22 0815  General:  Well appearing. No respiratory difficulty HEENT: normal Neck: supple. no JVD. Carotids 2+ bilat; no bruits. No lymphadenopathy or thryomegaly appreciated. Cor: PMI nondisplaced. Regular rate & rhythm. No rubs, gallops or murmurs. Lungs: clear Abdomen: soft, nontender, nondistended. No hepatosplenomegaly. No bruits or masses. Good bowel sounds. Extremities: no cyanosis, clubbing, rash, edema Neuro: alert & oriented x 3, cranial nerves grossly intact. moves all 4 extremities w/o difficulty. Affect pleasant.  ECG: sinus tachycardia 101/min t wave inversionlaterally  Results for orders placed or performed during the hospital encounter of 12/02/22 (from the past 24 hour(s))  CBC     Status: None   Collection Time: 12/02/22  9:31 PM  Result Value Ref Range   WBC 9.8 4.0 - 10.5 K/uL   RBC 4.24 3.87 - 5.11 MIL/uL   Hemoglobin 13.4 12.0 - 15.0 g/dL   HCT 38.3 36.0 - 46.0 %   MCV 90.3 80.0 - 100.0 fL   MCH 31.6 26.0 - 34.0 pg    MCHC 35.0 30.0 - 36.0 g/dL   RDW 12.7 11.5 - 15.5 %   Platelets 219 150 - 400 K/uL   nRBC 0.0 0.0 - 0.2 %  Comprehensive metabolic panel     Status: Abnormal   Collection Time: 12/02/22  9:31 PM  Result Value Ref Range   Sodium 133 (L) 135 - 145 mmol/L   Potassium 4.0 3.5 - 5.1 mmol/L   Chloride 100 98 - 111 mmol/L   CO2 22 22 - 32 mmol/L   Glucose, Bld 156 (H) 70 - 99 mg/dL   BUN 21 8 - 23 mg/dL   Creatinine, Ser 0.68 0.44 - 1.00 mg/dL   Calcium 8.6 (L) 8.9 - 10.3 mg/dL   Total Protein 7.0 6.5 - 8.1 g/dL   Albumin 4.3 3.5 - 5.0 g/dL   AST 21 15 - 41 U/L   ALT 12 0 - 44 U/L   Alkaline Phosphatase 61 38 - 126 U/L   Total Bilirubin 0.8 0.3 - 1.2 mg/dL   GFR, Estimated >60 >60 mL/min   Anion gap 11 5 - 15  Troponin I (High Sensitivity)     Status: Abnormal   Collection Time: 12/02/22  9:31 PM  Result Value Ref Range   Troponin I (High Sensitivity) 133 (HH) <18 ng/L  Hemoglobin A1c     Status: Abnormal   Collection Time: 12/02/22  9:31 PM  Result Value Ref Range   Hgb A1c MFr Bld 4.5 (L) 4.8 - 5.6 %   Mean Plasma Glucose 82.45 mg/dL  Troponin I (High Sensitivity)     Status: Abnormal   Collection Time: 12/02/22 11:41 PM  Result Value Ref Range   Troponin I (High Sensitivity) 1,256 (HH) <18 ng/L  Troponin I (High Sensitivity)     Status: Abnormal   Collection Time: 12/03/22  1:18 AM  Result Value Ref Range   Troponin I (High Sensitivity) 1,393 (HH) <18 ng/L  TSH     Status: None   Collection Time: 12/03/22  1:18 AM  Result Value Ref Range   TSH 0.802 0.350 - 4.500 uIU/mL  Lipid panel     Status: None   Collection Time: 12/03/22  1:18 AM  Result Value Ref Range   Cholesterol 169 0 - 200 mg/dL   Triglycerides 36 <150 mg/dL   HDL 78 >40 mg/dL   Total CHOL/HDL Ratio 2.2 RATIO   VLDL 7 0 - 40 mg/dL   LDL Cholesterol 84 0 - 99 mg/dL  CBC  Status: None   Collection Time: 12/03/22  1:50 AM  Result Value Ref Range   WBC 6.6 4.0 - 10.5 K/uL   RBC 4.10 3.87 - 5.11 MIL/uL    Hemoglobin 13.0 12.0 - 15.0 g/dL   HCT 37.0 36.0 - 46.0 %   MCV 90.2 80.0 - 100.0 fL   MCH 31.7 26.0 - 34.0 pg   MCHC 35.1 30.0 - 36.0 g/dL   RDW 12.7 11.5 - 15.5 %   Platelets 157 150 - 400 K/uL   nRBC 0.0 0.0 - 0.2 %  Comprehensive metabolic panel     Status: Abnormal   Collection Time: 12/03/22  1:50 AM  Result Value Ref Range   Sodium 133 (L) 135 - 145 mmol/L   Potassium 4.5 3.5 - 5.1 mmol/L   Chloride 100 98 - 111 mmol/L   CO2 22 22 - 32 mmol/L   Glucose, Bld 149 (H) 70 - 99 mg/dL   BUN 21 8 - 23 mg/dL   Creatinine, Ser 0.63 0.44 - 1.00 mg/dL   Calcium 8.8 (L) 8.9 - 10.3 mg/dL   Total Protein 7.0 6.5 - 8.1 g/dL   Albumin 4.3 3.5 - 5.0 g/dL   AST 23 15 - 41 U/L   ALT 13 0 - 44 U/L   Alkaline Phosphatase 61 38 - 126 U/L   Total Bilirubin 0.9 0.3 - 1.2 mg/dL   GFR, Estimated >60 >60 mL/min   Anion gap 11 5 - 15  Protime-INR     Status: None   Collection Time: 12/03/22  1:50 AM  Result Value Ref Range   Prothrombin Time 13.7 11.4 - 15.2 seconds   INR 1.1 0.8 - 1.2  APTT     Status: None   Collection Time: 12/03/22  1:50 AM  Result Value Ref Range   aPTT 26 24 - 36 seconds  Brain natriuretic peptide     Status: Abnormal   Collection Time: 12/03/22  2:18 AM  Result Value Ref Range   B Natriuretic Peptide 133.8 (H) 0.0 - 100.0 pg/mL  Heparin level (unfractionated)     Status: Abnormal   Collection Time: 12/03/22  5:00 AM  Result Value Ref Range   Heparin Unfractionated 0.81 (H) 0.30 - 0.70 IU/mL  Troponin I (High Sensitivity)     Status: Abnormal   Collection Time: 12/03/22  5:00 AM  Result Value Ref Range   Troponin I (High Sensitivity) 1,274 (HH) <18 ng/L  CBG monitoring, ED     Status: Abnormal   Collection Time: 12/03/22  7:57 AM  Result Value Ref Range   Glucose-Capillary 138 (H) 70 - 99 mg/dL   CT Angio Chest PE W and/or Wo Contrast  Result Date: 12/02/2022 CLINICAL DATA:  Pulmonary embolism (PE) suspected, high prob. Respiratory distress, shortness of  breath EXAM: CT ANGIOGRAPHY CHEST WITH CONTRAST TECHNIQUE: Multidetector CT imaging of the chest was performed using the standard protocol during bolus administration of intravenous contrast. Multiplanar CT image reconstructions and MIPs were obtained to evaluate the vascular anatomy. RADIATION DOSE REDUCTION: This exam was performed according to the departmental dose-optimization program which includes automated exposure control, adjustment of the mA and/or kV according to patient size and/or use of iterative reconstruction technique. CONTRAST:  55mL OMNIPAQUE IOHEXOL 350 MG/ML SOLN COMPARISON:  02/02/2022 FINDINGS: Cardiovascular: No filling defects in the pulmonary arteries to suggest pulmonary emboli. Heart is normal size. Aorta is normal caliber. Aortic atherosclerosis and coronary artery calcifications. Mediastinum/Nodes: No mediastinal, hilar, or axillary adenopathy. Trachea  and esophagus are unremarkable. Thyroid unremarkable. Lungs/Pleura: Centrilobular emphysema. No confluent opacities or effusions. Upper Abdomen: No acute findings Musculoskeletal: Chest wall soft tissues are unremarkable. No acute bony abnormality. Review of the MIP images confirms the above findings. IMPRESSION: No evidence of pulmonary embolus. Coronary artery disease. No acute cardiopulmonary disease. Aortic Atherosclerosis (ICD10-I70.0) and Emphysema (ICD10-J43.9). Electronically Signed   By: Rolm Baptise M.D.   On: 12/02/2022 23:02   DG Chest Port 1 View  Result Date: 12/02/2022 CLINICAL DATA:  Shortness of breath EXAM: PORTABLE CHEST 1 VIEW COMPARISON:  11/18/2020 FINDINGS: Mild hyperinflation/COPD. Heart and mediastinal contours are within normal limits. No focal opacities or effusions. No acute bony abnormality. Aortic atherosclerosis. IMPRESSION: COPD.  No active disease. Electronically Signed   By: Rolm Baptise M.D.   On: 12/02/2022 22:10     ASSESSMENT AND PLAN: NSTEMI, troponin 1200, advise  cath.pateinent agreed to  procedure.  Kynsli Haapala Humphrey Rolls

## 2022-12-03 NOTE — Progress Notes (Addendum)
Progress Note    Ashley Brooks  WIO:[id number] DOB: Feb 08, 1946  DOA: 12/02/2022 PCP: Juluis Pitch, MD      Brief Narrative:    Medical records reviewed and are as summarized below:  Ashley Brooks is a 77 y.o. female with medical history significant for COPD, hyperlipidemia, anxiety, tobacco use disorder, who presented to the hospital with sudden onset of shortness of breath.  Reportedly, when EMS found her, they were empty red wine bottles around her.  She was given IV Solu-Medrol and DuoNeb and was brought to the emergency department for further management.  She is not on home oxygen.    She had elevated troponin and troponin went from 133 to 1,393. She was admitted to the hospital for acute NSTEMI and COPD exacerbation.  She was treated with IV heparin drip, steroids and bronchodilators.  She underwent left heart catheterization which did not show any evidence of CAD but she was found to have severe LV dysfunction with EF less than 20% consistent with acute systolic CHF.      Assessment/Plan:   Principal Problem:   Acute systolic CHF (congestive heart failure) (HCC) Active Problems:   COPD exacerbation (HCC)   HLD (hyperlipidemia)   Depression with anxiety   Elevated troponin    Body mass index is 19.72 kg/m.   Acute systolic CHF: Left heart cath did not show evidence of CAD.  Showed severe LV dysfunction with EF less than 20%.  2D echo is pending.  Dr. Humphrey Rolls, cardiologist, recommended IV Lasix drip, metoprolol, Aldactone and IV digoxin.   Elevated troponin: Troponin went from 133 to 1,393.  This is likely due to demand ischemia.  No CAD on cardiac cath.  IV heparin drip has been discontinued. Hemoglobin A1c was 4.5.  LDL was 84, HDL 78, total cholesterol 169  COPD exacerbation: Continue steroids and bronchodilators.   History of tobacco use disorder: She said she had a 30-pack-year smoking history but she quit smoking cigarettes about 5 years  ago   Alcohol use: She said she only drinks alcohol occasionally.   Other comorbidities include hyperlipidemia, depression, anxiety  Diet Order             Diet regular Room service appropriate? Yes; Fluid consistency: Thin  Diet effective now                            Consultants: Cardiologist  Procedures: Left heart cath     Medications:    albuterol       arformoterol  15 mcg Nebulization BID   And   umeclidinium bromide  1 puff Inhalation Daily   [START ON 12/04/2022] aspirin  81 mg Oral Daily   atorvastatin  40 mg Oral Daily   digoxin  0.5 mg Intravenous Once   doxycycline  100 mg Oral Q12H   methylPREDNISolone (SOLU-MEDROL) injection  40 mg Intravenous Q12H   Followed by   Derrill Memo ON 12/04/2022] predniSONE  40 mg Oral Q breakfast   metoprolol succinate  25 mg Oral Daily   sodium chloride flush  3 mL Intravenous Q12H   sodium chloride flush  3 mL Intravenous Q12H   spironolactone  12.5 mg Oral Daily   Continuous Infusions:  sodium chloride     sodium chloride     furosemide (LASIX) 200 mg in dextrose 5 % 100 mL (2 mg/mL) infusion       Anti-infectives (From admission, onward)  Start     Dose/Rate Route Frequency Ordered Stop   12/03/22 0230  doxycycline (VIBRA-TABS) tablet 100 mg        100 mg Oral Every 12 hours 12/03/22 0121 12/07/22 2159              Family Communication/Anticipated D/C date and plan/Code Status   DVT prophylaxis:      Code Status: Full Code  Family Communication: None Disposition Plan: Plan to discharge home in 2 to 3 days   Status is: Inpatient Remains inpatient appropriate because: Acute NSTEMI       Subjective:   Interval events noted.  She complains of shortness of breath but she feels a little better today.  No chest pain.  She is not coughing much.  She was seen before cardiac cath.  She was on IV heparin drip  Objective:    Vitals:   12/03/22 1038 12/03/22 1045 12/03/22 1100  12/03/22 1115  BP: 115/74 111/74 111/72 92/74  Pulse: 100 99 96 96  Resp: 18 (!) 25 (!) 24 (!) 24  Temp:      TempSrc:      SpO2: 96% 98% 100% 100%  Weight:      Height:       No data found.  No intake or output data in the 24 hours ending 12/03/22 1133 Filed Weights   12/02/22 2127  Weight: 45.8 kg    Exam:  GEN: NAD SKIN: Warm and dry EYES: No pallor or icterus ENT: MMM CV: RRR PULM: Decreased air entry blaterally, b/l wheezing ABD: soft, ND, NT, +BS CNS: AAO x 3, non focal EXT: No edema or tenderness      Data Reviewed:   I have personally reviewed following labs and imaging studies:  Labs: Labs show the following:   Basic Metabolic Panel: Recent Labs  Lab 12/02/22 2131 12/03/22 0150  NA 133* 133*  K 4.0 4.5  CL 100 100  CO2 22 22  GLUCOSE 156* 149*  BUN 21 21  CREATININE 0.68 0.63  CALCIUM 8.6* 8.8*   GFR Estimated Creatinine Clearance: 43 mL/min (by C-G formula based on SCr of 0.63 mg/dL). Liver Function Tests: Recent Labs  Lab 12/02/22 2131 12/03/22 0150  AST 21 23  ALT 12 13  ALKPHOS 61 61  BILITOT 0.8 0.9  PROT 7.0 7.0  ALBUMIN 4.3 4.3   No results for input(s): "LIPASE", "AMYLASE" in the last 168 hours. No results for input(s): "AMMONIA" in the last 168 hours. Coagulation profile Recent Labs  Lab 12/03/22 0150  INR 1.1    CBC: Recent Labs  Lab 12/02/22 2131 12/03/22 0150  WBC 9.8 6.6  HGB 13.4 13.0  HCT 38.3 37.0  MCV 90.3 90.2  PLT 219 157   Cardiac Enzymes: No results for input(s): "CKTOTAL", "CKMB", "CKMBINDEX", "TROPONINI" in the last 168 hours. BNP (last 3 results) No results for input(s): "PROBNP" in the last 8760 hours. CBG: Recent Labs  Lab 12/03/22 0757  GLUCAP 138*   D-Dimer: No results for input(s): "DDIMER" in the last 72 hours. Hgb A1c: Recent Labs    12/02/22 2131  HGBA1C 4.5*   Lipid Profile: Recent Labs    12/03/22 0118  CHOL 169  HDL 78  LDLCALC 84  TRIG 36  CHOLHDL 2.2    Thyroid function studies: Recent Labs    12/03/22 0118  TSH 0.802   Anemia work up: No results for input(s): "VITAMINB12", "FOLATE", "FERRITIN", "TIBC", "IRON", "RETICCTPCT" in the last 72 hours.  Sepsis Labs: Recent Labs  Lab 12/02/22 2131 12/03/22 0150  WBC 9.8 6.6    Microbiology No results found for this or any previous visit (from the past 240 hour(s)).  Procedures and diagnostic studies:  CARDIAC CATHETERIZATION  Result Date: 12/03/2022   Mid LAD lesion is 30% stenosed.   There is severe left ventricular systolic dysfunction.   LV end diastolic pressure is severely elevated.   The left ventricular ejection fraction is less than 25% by visual estimate. Minor luminor irregularities, with severe LV systolic dysfunction. Advise  metoprolol/digoxin/lasix drip/aldactone and entresto tomorrow   CT Angio Chest PE W and/or Wo Contrast  Result Date: 12/02/2022 CLINICAL DATA:  Pulmonary embolism (PE) suspected, high prob. Respiratory distress, shortness of breath EXAM: CT ANGIOGRAPHY CHEST WITH CONTRAST TECHNIQUE: Multidetector CT imaging of the chest was performed using the standard protocol during bolus administration of intravenous contrast. Multiplanar CT image reconstructions and MIPs were obtained to evaluate the vascular anatomy. RADIATION DOSE REDUCTION: This exam was performed according to the departmental dose-optimization program which includes automated exposure control, adjustment of the mA and/or kV according to patient size and/or use of iterative reconstruction technique. CONTRAST:  6mL OMNIPAQUE IOHEXOL 350 MG/ML SOLN COMPARISON:  02/02/2022 FINDINGS: Cardiovascular: No filling defects in the pulmonary arteries to suggest pulmonary emboli. Heart is normal size. Aorta is normal caliber. Aortic atherosclerosis and coronary artery calcifications. Mediastinum/Nodes: No mediastinal, hilar, or axillary adenopathy. Trachea and esophagus are unremarkable. Thyroid unremarkable.  Lungs/Pleura: Centrilobular emphysema. No confluent opacities or effusions. Upper Abdomen: No acute findings Musculoskeletal: Chest wall soft tissues are unremarkable. No acute bony abnormality. Review of the MIP images confirms the above findings. IMPRESSION: No evidence of pulmonary embolus. Coronary artery disease. No acute cardiopulmonary disease. Aortic Atherosclerosis (ICD10-I70.0) and Emphysema (ICD10-J43.9). Electronically Signed   By: Rolm Baptise M.D.   On: 12/02/2022 23:02   DG Chest Port 1 View  Result Date: 12/02/2022 CLINICAL DATA:  Shortness of breath EXAM: PORTABLE CHEST 1 VIEW COMPARISON:  11/18/2020 FINDINGS: Mild hyperinflation/COPD. Heart and mediastinal contours are within normal limits. No focal opacities or effusions. No acute bony abnormality. Aortic atherosclerosis. IMPRESSION: COPD.  No active disease. Electronically Signed   By: Rolm Baptise M.D.   On: 12/02/2022 22:10               LOS: 0 days   Zahirah Cheslock  [facility]   Pager on [email or web address]. If 7PM-7AM, please contact night-coverage at [email or web address]     12/03/2022, 11:33 AM

## 2022-12-03 NOTE — Progress Notes (Signed)
*  PRELIMINARY RESULTS* Echocardiogram 2D Echocardiogram has been performed.  Elpidio Anis 12/03/2022, 1:05 PM

## 2022-12-04 ENCOUNTER — Encounter: Payer: Self-pay | Admitting: Cardiovascular Disease

## 2022-12-04 DIAGNOSIS — J441 Chronic obstructive pulmonary disease with (acute) exacerbation: Secondary | ICD-10-CM | POA: Diagnosis not present

## 2022-12-04 DIAGNOSIS — J9611 Chronic respiratory failure with hypoxia: Secondary | ICD-10-CM | POA: Diagnosis present

## 2022-12-04 DIAGNOSIS — J9601 Acute respiratory failure with hypoxia: Secondary | ICD-10-CM

## 2022-12-04 DIAGNOSIS — L8990 Pressure ulcer of unspecified site, unspecified stage: Secondary | ICD-10-CM | POA: Diagnosis present

## 2022-12-04 DIAGNOSIS — I5021 Acute systolic (congestive) heart failure: Secondary | ICD-10-CM | POA: Diagnosis not present

## 2022-12-04 HISTORY — DX: Acute respiratory failure with hypoxia: J96.01

## 2022-12-04 LAB — BASIC METABOLIC PANEL
Anion gap: 10 (ref 5–15)
BUN: 28 mg/dL — ABNORMAL HIGH (ref 8–23)
CO2: 27 mmol/L (ref 22–32)
Calcium: 8.7 mg/dL — ABNORMAL LOW (ref 8.9–10.3)
Chloride: 95 mmol/L — ABNORMAL LOW (ref 98–111)
Creatinine, Ser: 0.84 mg/dL (ref 0.44–1.00)
GFR, Estimated: >60 mL/min (ref >60)
Glucose, Bld: 124 mg/dL — ABNORMAL HIGH (ref 70–99)
Potassium: 4 mmol/L (ref 3.5–5.1)
Sodium: 132 mmol/L — ABNORMAL LOW (ref 135–145)

## 2022-12-04 LAB — GLUCOSE, CAPILLARY: Glucose-Capillary: 154 mg/dL — ABNORMAL HIGH (ref 70–99)

## 2022-12-04 MED ORDER — ENSURE ENLIVE PO LIQD
237.0000 mL | Freq: Two times a day (BID) | ORAL | Status: DC
  Administered 2022-12-04 – 2022-12-11 (×12): 237 mL via ORAL

## 2022-12-04 MED ORDER — SACUBITRIL-VALSARTAN 24-26 MG PO TABS
1.0000 | ORAL_TABLET | Freq: Two times a day (BID) | ORAL | Status: DC
  Administered 2022-12-04 – 2022-12-05 (×3): 1 via ORAL
  Filled 2022-12-04 (×3): qty 1

## 2022-12-04 MED ORDER — DIGOXIN 125 MCG PO TABS
0.1250 mg | ORAL_TABLET | Freq: Every day | ORAL | Status: DC
  Administered 2022-12-05 – 2022-12-11 (×7): 0.125 mg via ORAL
  Filled 2022-12-04 (×9): qty 1

## 2022-12-04 NOTE — Plan of Care (Signed)
  Problem: Education: Goal: Knowledge of disease or condition will improve Outcome: Progressing Goal: Knowledge of the prescribed therapeutic regimen will improve Outcome: Progressing Goal: Individualized Educational Video(s) Outcome: Progressing   Problem: Activity: Goal: Ability to tolerate increased activity will improve Outcome: Progressing Goal: Will verbalize the importance of balancing activity with adequate rest periods Outcome: Progressing   Problem: Respiratory: Goal: Ability to maintain a clear airway will improve Outcome: Progressing Goal: Levels of oxygenation will improve Outcome: Progressing Goal: Ability to maintain adequate ventilation will improve Outcome: Progressing   Problem: Education: Goal: Understanding of CV disease, CV risk reduction, and recovery process will improve Outcome: Progressing Goal: Individualized Educational Video(s) Outcome: Progressing   Problem: Activity: Goal: Ability to return to baseline activity level will improve Outcome: Progressing   Problem: Cardiovascular: Goal: Ability to achieve and maintain adequate cardiovascular perfusion will improve Outcome: Progressing Goal: Vascular access site(s) Level 0-1 will be maintained Outcome: Progressing   Problem: Health Behavior/Discharge Planning: Goal: Ability to safely manage health-related needs after discharge will improve Outcome: Progressing   Problem: Education: Goal: Knowledge of General Education information will improve Description: Including pain rating scale, medication(s)/side effects and non-pharmacologic comfort measures Outcome: Progressing   Problem: Health Behavior/Discharge Planning: Goal: Ability to manage health-related needs will improve Outcome: Progressing   Problem: Clinical Measurements: Goal: Ability to maintain clinical measurements within normal limits will improve Outcome: Progressing Goal: Will remain free from infection Outcome:  Progressing Goal: Diagnostic test results will improve Outcome: Progressing Goal: Respiratory complications will improve Outcome: Progressing Goal: Cardiovascular complication will be avoided Outcome: Progressing   Problem: Activity: Goal: Risk for activity intolerance will decrease Outcome: Progressing   Problem: Nutrition: Goal: Adequate nutrition will be maintained Outcome: Progressing   Problem: Coping: Goal: Level of anxiety will decrease Outcome: Progressing   Problem: Elimination: Goal: Will not experience complications related to bowel motility Outcome: Progressing Goal: Will not experience complications related to urinary retention Outcome: Progressing   Problem: Pain Managment: Goal: General experience of comfort will improve Outcome: Progressing   Problem: Safety: Goal: Ability to remain free from injury will improve Outcome: Progressing   Problem: Skin Integrity: Goal: Risk for impaired skin integrity will decrease Outcome: Progressing

## 2022-12-04 NOTE — TOC Progression Note (Signed)
Transition of Care [facility]) - Progression Note    Patient Details  Name: Ashley Brooks MRN: [id number] Date of Birth: 1946-05-16  [facility] CM/SW Contact  Laurena Slimmer, RN Phone Number: 12/04/2022, 2:35 PM  Clinical Narrative:    Case reviewed for needs and disposition. Unable to reach patient to discuss.   2:30pm Spoke with patient at bedside regarding therapy recommendation. She stated they may have therapy in her [facility].  CM will contact facility.         Expected Discharge Plan and Services                                               Social Determinants of Health (SDOH) Interventions SDOH Screenings   Food Insecurity: No Food Insecurity (12/03/2022)  Housing: Low Risk  (12/03/2022)  Transportation Needs: No Transportation Needs (12/03/2022)  Utilities: Not At Risk (12/03/2022)  Tobacco Use: Medium Risk (12/04/2022)    Readmission Risk Interventions     No data to display

## 2022-12-04 NOTE — Progress Notes (Signed)
SUBJECTIVE: Patient Ashley Brooks is a 77 y.o. female with medical history significant for COPD, not O2 dependent, HLD, Anxiety , Tobacco abuse  who presents to ED on 12/02/22 via EMS complaining of one day history of shortness of breath not relieved by usual home medications.   Echocardiogram 12/03/22 revealed EF 20-25%. EMS noted home to be littered with empty red wine bottles.  Troponin levels 133>1256>1393>1274. Patient underwent cardiac catheterization on 12/03/22 minor luminor irregularities, with severe LV systolic dysfunction.    Vitals:   12/04/22 0122 12/04/22 0500 12/04/22 0512 12/04/22 0903  BP: 115/71  (!) 91/53 113/68  Pulse: 97  99 95  Resp: 18  18 18   Temp: 98.5 F (36.9 C)  98.4 F (36.9 C) 97.8 F (36.6 C)  TempSrc: Oral  Oral   SpO2: 100%  97% 98%  Weight:  44.1 kg    Height:        Intake/Output Summary (Last 24 hours) at 12/04/2022 0903 Last data filed at 12/04/2022 0160 Gross per 24 hour  Intake 896.63 ml  Output 1725 ml  Net -828.37 ml    LABS: Basic Metabolic Panel: Recent Labs    12/03/22 0150 12/04/22 0624  NA 133* 132*  K 4.5 4.0  CL 100 95*  CO2 22 27  GLUCOSE 149* 124*  BUN 21 28*  CREATININE 0.63 0.84  CALCIUM 8.8* 8.7*   Liver Function Tests: Recent Labs    12/02/22 2131 12/03/22 0150  AST 21 23  ALT 12 13  ALKPHOS 61 61  BILITOT 0.8 0.9  PROT 7.0 7.0  ALBUMIN 4.3 4.3   No results for input(s): "LIPASE", "AMYLASE" in the last 72 hours. CBC: Recent Labs    12/02/22 2131 12/03/22 0150  WBC 9.8 6.6  HGB 13.4 13.0  HCT 38.3 37.0  MCV 90.3 90.2  PLT 219 157   Cardiac Enzymes: No results for input(s): "CKTOTAL", "CKMB", "CKMBINDEX", "TROPONINI" in the last 72 hours. BNP: Invalid input(s): "POCBNP" D-Dimer: No results for input(s): "DDIMER" in the last 72 hours. Hemoglobin A1C: Recent Labs    12/02/22 2131  HGBA1C 4.5*   Fasting Lipid Panel: Recent Labs    12/03/22 0118  CHOL 169  HDL 78  LDLCALC 84  TRIG 36   CHOLHDL 2.2   Thyroid Function Tests: Recent Labs    12/03/22 0118  TSH 0.802   Anemia Panel: No results for input(s): "VITAMINB12", "FOLATE", "FERRITIN", "TIBC", "IRON", "RETICCTPCT" in the last 72 hours.   PHYSICAL EXAM General: Well developed, well nourished, in no acute distress HEENT:  Normocephalic and atramatic Neck:  No JVD.  Lungs: Clear bilaterally to auscultation and percussion. Heart: HRRR . Normal S1 and S2 without gallops or murmurs.  Abdomen: Bowel sounds are positive, abdomen soft and non-tender  Msk:  Back normal, normal gait. Normal strength and tone for age. Extremities: No clubbing, cyanosis or edema.   Neuro: Alert and oriented X 3. Psych:  Good affect, responds appropriately  TELEMETRY: sinus rhythm, HR 98 bpm  ASSESSMENT AND PLAN: Patient resting comfortably in bed. Denies chest pain. Patient started on metoprolol, digoxin, aldactone and Lasix yesterday. Will start Entresto for low EF. Will continue to follow.   Principal Problem:   Acute systolic CHF (congestive heart failure) (HCC) Active Problems:   HLD (hyperlipidemia)   COPD exacerbation (HCC)   Depression with anxiety   Elevated troponin    Other Atienza, FNP-C 12/04/2022 9:03 AM

## 2022-12-04 NOTE — Evaluation (Signed)
Physical Therapy Evaluation Patient Details Name: Ashley Brooks MRN: [id number] DOB: 05/30/1946 Today's Date: 12/04/2022  History of Present Illness  Ashley Brooks is a 77 y.o. female with history of COPD who presents with complaints of shortness of breath.  Patient reports over the last 3 to 4 days shortness of breath is not worsening significantly especially with exertion.  She does feel some chest tightness and feels as though she is wheezing.  Occasional dry cough.  No fevers or chills.  Has had 2 shots of Covid vaccine.  No nausea vomiting or diaphoresis.  No calf pain or swelling.   Clinical Impression  Pt admitted with above diagnosis. Pt received upright in bed agreeable to PT services. Pt reports at baseline she lives at [place] and is mod-I with her rollator and performs ADL's independently.   To date, pt supposed to be on 2 L/min via . Cannula was disrupted with pt satting at 90-91% on RA. Trialed RA with bed mobility. Pt performs mod-I but desats to low 80's just transferring to EOB. Quick return to > 90% with PLB and return to 2 L/min. Pt is able to stand to RW and ambulate  at supervision level. Pt is quick to demonstrate SoB ambulating around the the bed to the recliner requesting need to sit. Pt reports feeling weaker but mostly limited due to her SOB. Pt did desaturate on 2 L/min to 87-88% but does quickly return to 98% with PLB. Pt educated on use of rollator for energy conservation by utilizing brakes and seat. Pt with all needs in reach. Anticipate pt will be safe to d/c home with [facility] PT services as SOB improves. Pt currently with functional limitations due to the deficits listed below (see PT Problem List). Pt will benefit from skilled PT to increase their independence and safety with mobility to allow discharge to the venue listed below.      Recommendations for follow up therapy are one component of a multi-disciplinary discharge planning process, led by the  attending physician.  Recommendations may be updated based on patient status, additional functional criteria and insurance authorization.  Follow Up Recommendations Home health PT      Assistance Recommended at Discharge PRN  Patient can return home with the following  A little help with walking and/or transfers;A little help with bathing/dressing/bathroom;Assistance with cooking/housework;Assist for transportation;Help with stairs or ramp for entrance    Equipment Recommendations None recommended by PT  Recommendations for Other Services       Functional Status Assessment Patient has had a recent decline in their functional status and demonstrates the ability to make significant improvements in function in a reasonable and predictable amount of time.     Precautions / Restrictions Precautions Precautions: Fall Restrictions Weight Bearing Restrictions: No      Mobility  Bed Mobility Overal bed mobility: Modified Independent             General bed mobility comments: increased time Patient Response: Cooperative  Transfers Overall transfer level: Needs assistance Equipment used: Rolling walker (2 wheels) Transfers: Sit to/from Stand Sit to Stand: Supervision                Ambulation/Gait Ambulation/Gait assistance: Supervision Gait Distance (Feet): 20 Feet Assistive device: Rolling walker (2 wheels) Gait Pattern/deviations: Step-through pattern, Decreased step length - right, Decreased step length - left, Narrow base of support       General Gait Details: Pt with slowed cadence per subjective report. Correct  and safe use of RW, easily becomes SOB ambulating in room.  Stairs            Wheelchair Mobility    Modified Rankin (Stroke Patients Only)       Balance Overall balance assessment: Needs assistance Sitting-balance support: Bilateral upper extremity supported, Feet supported Sitting balance-Leahy Scale: Good     Standing balance  support: Bilateral upper extremity supported Standing balance-Leahy Scale: Fair                               Pertinent Vitals/Pain Pain Assessment Pain Assessment: No/denies pain    Home Living Family/patient expects to be discharged to:: Private residence ([place]) Living Arrangements: Alone   Type of Home: Apartment Home Access: Elevator (Lives on 3rd floor.)       Home Layout: One level Home Equipment: Conservation officer, nature (2 wheels);Rollator (4 wheels);Shower seat      Prior Function Prior Level of Function : Independent/Modified Independent                     Hand Dominance        Extremity/Trunk Assessment   Upper Extremity Assessment Upper Extremity Assessment: Defer to OT evaluation    Lower Extremity Assessment Lower Extremity Assessment: Overall WFL for tasks assessed    Cervical / Trunk Assessment Cervical / Trunk Assessment: Normal  Communication   Communication: HOH;No difficulties  Cognition Arousal/Alertness: Awake/alert Behavior During Therapy: WFL for tasks assessed/performed Overall Cognitive Status: Within Functional Limits for tasks assessed                                          General Comments General comments (skin integrity, edema, etc.): Pt on RA upon entry. Resting SPO2 90-91%. With bed mobility quick desat to 83%. Pt placed on 2 L/min. Post ambulation desat to 88% with quick return to 98% with seated recovery and PLB.    Exercises Other Exercises Other Exercises: Role of PT in acute setting, d/c recs, energy conservation using her rollator.   Assessment/Plan    PT Assessment Patient needs continued PT services  PT Problem List Decreased strength;Decreased activity tolerance;Decreased mobility       PT Treatment Interventions DME instruction;Gait training;Balance training;Neuromuscular re-education;Functional mobility training;Therapeutic activities;Patient/family education    PT  Goals (Current goals can be found in the Care Plan section)  Acute Rehab PT Goals Patient Stated Goal: improve her breathing PT Goal Formulation: With patient Time For Goal Achievement: 12/18/22 Potential to Achieve Goals: Good    Frequency Min 2X/week     Co-evaluation               AM-PAC PT "6 Clicks" Mobility  Outcome Measure Help needed turning from your back to your side while in a flat bed without using bedrails?: A Little Help needed moving from lying on your back to sitting on the side of a flat bed without using bedrails?: A Little Help needed moving to and from a bed to a chair (including a wheelchair)?: A Little Help needed standing up from a chair using your arms (e.g., wheelchair or bedside chair)?: A Little Help needed to walk in hospital room?: A Little Help needed climbing 3-5 steps with a railing? : A Lot 6 Click Score: 17    End of Session Equipment Utilized During  Treatment: Gait belt;Oxygen Activity Tolerance: Patient tolerated treatment well Patient left: in chair;with call bell/phone within reach;with chair alarm set Nurse Communication: Mobility status PT Visit Diagnosis: Other abnormalities of gait and mobility (R26.89);Muscle weakness (generalized) (M62.81)    Time: 6468-0321 PT Time Calculation (min) (ACUTE ONLY): 17 min   Charges:   PT Evaluation $PT Eval Moderate Complexity: [place] M. Fairly IV, PT, DPT Physical Therapist- [facility]  12/04/2022, 12:44 PM

## 2022-12-04 NOTE — Progress Notes (Addendum)
Progress Note    Ashley Brooks  FAO:[id number] DOB: 1946-04-02  DOA: 12/02/2022 PCP: Juluis Pitch, MD      Brief Narrative:    Medical records reviewed and are as summarized below:  Ashley Brooks is a 77 y.o. female with medical history significant for COPD, hyperlipidemia, anxiety, tobacco use disorder, who presented to the hospital with sudden onset of shortness of breath.  Reportedly, when EMS found her, they were empty red wine bottles around her.  She was given IV Solu-Medrol and DuoNeb and was brought to the emergency department for further management.  She is not on home oxygen.    She had elevated troponin and troponin went from 133 to 1,393. She was admitted to the hospital for acute NSTEMI and COPD exacerbation.  She was treated with IV heparin drip, steroids and bronchodilators.  She underwent left heart catheterization which did not show any evidence of CAD but she was found to have severe LV dysfunction with EF less than 20% consistent with acute systolic CHF.      Assessment/Plan:   Principal Problem:   Acute systolic CHF (congestive heart failure) (HCC) Active Problems:   COPD exacerbation (HCC)   HLD (hyperlipidemia)   Depression with anxiety   Elevated troponin   Chronic hypoxic respiratory failure (HCC)   Pressure injury of skin    Body mass index is 18.99 kg/m.   Acute systolic CHF: Left heart cath did not show evidence of CAD.  2D echo showed EF estimated at 20 to 25%, wall motion abnormalities suggestive of stress-induced cardiomyopathy, indeterminate LV diastolic parameters  She has been started on metoprolol, Aldactone and Entresto by cardiologist.  Continue low rate Lasix drip per cardiologist. Monitor BP closely PT evaluation.   Elevated troponin: Troponin went from 133 to 1,393.  This is likely due to demand ischemia.  No CAD on cardiac cath.  IV heparin drip has been discontinued. Hemoglobin A1c was 4.5.  LDL was 84, HDL 78,  total cholesterol 169   COPD exacerbation: Continue prednisone, doxycycline and bronchodilators.   Chronic hypoxic respiratory failure: Oxygen dropped into the low 80s while she was working with PT.  Oxygen saturation is in the 90s on 2 L/min oxygen.  She will need home oxygen.   Right second toe wound: Continue local wound care   History of tobacco use disorder: She said she had a 30-pack-year smoking history but she quit smoking cigarettes about 5 years ago   Alcohol use: She said she only drinks alcohol occasionally.   Other comorbidities include hyperlipidemia, depression, anxiety  Diet Order             Diet regular Room service appropriate? Yes; Fluid consistency: Thin  Diet effective now                            Consultants: Cardiologist  Procedures: Left heart cath     Medications:    arformoterol  15 mcg Nebulization BID   And   umeclidinium bromide  1 puff Inhalation Daily   aspirin  81 mg Oral Daily   atorvastatin  40 mg Oral Daily   digoxin  0.125 mg Oral Daily   doxycycline  100 mg Oral Q12H   feeding supplement  237 mL Oral BID BM   metoprolol succinate  25 mg Oral Daily   mupirocin ointment  1 Application Topical Daily   pneumococcal 20-valent conjugate vaccine  0.5  mL Intramuscular Tomorrow-1000   predniSONE  40 mg Oral Q breakfast   sacubitril-valsartan  1 tablet Oral BID   sodium chloride flush  3 mL Intravenous Q12H   sodium chloride flush  3 mL Intravenous Q12H   spironolactone  12.5 mg Oral Daily   Continuous Infusions:  sodium chloride     furosemide (LASIX) 200 mg in dextrose 5 % 100 mL (2 mg/mL) infusion 4 mg/hr (12/03/22 1412)     Anti-infectives (From admission, onward)    Start     Dose/Rate Route Frequency Ordered Stop   12/03/22 0230  doxycycline (VIBRA-TABS) tablet 100 mg        100 mg Oral Every 12 hours 12/03/22 0121 12/07/22 2159              Family Communication/Anticipated D/C date and  plan/Code Status   DVT prophylaxis:      Code Status: Full Code  Family Communication: None Disposition Plan: Plan to discharge home in 1 to 2 days   Status is: Inpatient Remains inpatient appropriate because: Acute NSTEMI       Subjective:   Interval events noted.  She still feels a little short of breath.  She is wheezing.  No chest pain.  Objective:    Vitals:   12/04/22 0512 12/04/22 0903 12/04/22 1159 12/04/22 1251  BP: (!) 91/53 113/68 118/63 (!) 98/58  Pulse: 99 95 83 83  Resp: 18 18 20 18   Temp: 98.4 F (36.9 C) 97.8 F (36.6 C) 97.6 F (36.4 C) 98.5 F (36.9 C)  TempSrc: Oral  Oral   SpO2: 97% 98% 100% 98%  Weight:      Height:       No data found.   Intake/Output Summary (Last 24 hours) at 12/04/2022 1447 Last data filed at 12/04/2022 1418 Gross per 24 hour  Intake 989.63 ml  Output 2075 ml  Net -1085.37 ml   Filed Weights   12/02/22 2127 12/04/22 0500  Weight: 45.8 kg 44.1 kg    Exam:  GEN: NAD SKIN: Warm and dry.   EYES: Anicteric ENT: MMM CV: RRR PULM: Bilateral wheezing, decreased air entry bilaterally, no rales heard ABD: soft, ND, NT, +BS CNS: AAO x 3, non focal EXT: No edema or tenderness.  Right second toe with erythema and scabbed wound on the dorsal aspect     Pressure Injury 12/03/22 Toe (Comment  which one) Anterior;Right Stage 2 -  Partial thickness loss of dermis presenting as a shallow open injury with a red, pink wound bed without slough. This is a full thickness wound, NOT a pressure injury (Active)  12/03/22 1200  Location: Toe (Comment  which one)  Location Orientation: Anterior;Right  Staging: Stage 2 -  Partial thickness loss of dermis presenting as a shallow open injury with a red, pink wound bed without slough.  Wound Description (Comments): This is a full thickness wound, NOT a pressure injury  Present on Admission: Yes  Dressing Type None 12/04/22 0800     Data Reviewed:   I have personally reviewed  following labs and imaging studies:  Labs: Labs show the following:   Basic Metabolic Panel: Recent Labs  Lab 12/02/22 2131 12/03/22 0150 12/04/22 0624  NA 133* 133* 132*  K 4.0 4.5 4.0  CL 100 100 95*  CO2 22 22 27   GLUCOSE 156* 149* 124*  BUN 21 21 28*  CREATININE 0.68 0.63 0.84  CALCIUM 8.6* 8.8* 8.7*   GFR Estimated Creatinine Clearance: 39.7 mL/min (  by C-G formula based on SCr of 0.84 mg/dL). Liver Function Tests: Recent Labs  Lab 12/02/22 2131 12/03/22 0150  AST 21 23  ALT 12 13  ALKPHOS 61 61  BILITOT 0.8 0.9  PROT 7.0 7.0  ALBUMIN 4.3 4.3   No results for input(s): "LIPASE", "AMYLASE" in the last 168 hours. No results for input(s): "AMMONIA" in the last 168 hours. Coagulation profile Recent Labs  Lab 12/03/22 0150  INR 1.1    CBC: Recent Labs  Lab 12/02/22 2131 12/03/22 0150  WBC 9.8 6.6  HGB 13.4 13.0  HCT 38.3 37.0  MCV 90.3 90.2  PLT 219 157   Cardiac Enzymes: No results for input(s): "CKTOTAL", "CKMB", "CKMBINDEX", "TROPONINI" in the last 168 hours. BNP (last 3 results) No results for input(s): "PROBNP" in the last 8760 hours. CBG: Recent Labs  Lab 12/03/22 0757 12/03/22 2109  GLUCAP 138* 142*   D-Dimer: No results for input(s): "DDIMER" in the last 72 hours. Hgb A1c: Recent Labs    12/02/22 2131  HGBA1C 4.5*   Lipid Profile: Recent Labs    12/03/22 0118  CHOL 169  HDL 78  LDLCALC 84  TRIG 36  CHOLHDL 2.2   Thyroid function studies: Recent Labs    12/03/22 0118  TSH 0.802   Anemia work up: No results for input(s): "VITAMINB12", "FOLATE", "FERRITIN", "TIBC", "IRON", "RETICCTPCT" in the last 72 hours. Sepsis Labs: Recent Labs  Lab 12/02/22 2131 12/03/22 0150  WBC 9.8 6.6    Microbiology No results found for this or any previous visit (from the past 240 hour(s)).  Procedures and diagnostic studies:  ECHOCARDIOGRAM COMPLETE  Result Date: 12/03/2022    ECHOCARDIOGRAM REPORT   Patient Name:   Ashley Brooks Date of Exam: 12/03/2022 Medical Rec #:  [id number]         Height:       60.0 in Accession #:    [id number]        Weight:       101.0 lb Date of Birth:  [birth year]         BSA:          1.396 m Patient Age:    5 years          BP:           148/65 mmHg Patient Gender: F                 HR:           97 bpm. Exam Location:  [facility] Procedure: 2D Echo, Cardiac Doppler, Color Doppler and Intracardiac            Opacification Agent Indications:     Dyspnea  History:         Patient has no prior history of Echocardiogram examinations.                  COPD, Signs/Symptoms:Dyspnea; Risk Factors:Dyslipidemia and                  Former Smoker.  Sonographer:     Wenda Low Referring Phys:  [id number] SARA-MAIZ A THOMAS Diagnosing Phys: Kathlyn Sacramento MD IMPRESSIONS  1. Left ventricular ejection fraction, by estimation, is 20 to 25%. The left ventricle has severely decreased function. The left ventricle demonstrates regional wall motion abnormalities (see scoring diagram/findings for description). Left ventricular diastolic parameters are indeterminate. There is severe hypokinesis of the left ventricular, mid-apical anterior wall, inferior wall, lateral wall, septal wall  and apical segment. Wall motion abnormalities are highly suggestive of stress induced cardiomyopathy.  2. Right ventricular systolic function is normal. The right ventricular size is normal. There is normal pulmonary artery systolic pressure.  3. The mitral valve is normal in structure. No evidence of mitral valve regurgitation. No evidence of mitral stenosis.  4. The aortic valve is normal in structure. Aortic valve regurgitation is not visualized. Aortic valve sclerosis/calcification is present, without any evidence of aortic stenosis.  5. The inferior vena cava is normal in size with greater than 50% respiratory variability, suggesting right atrial pressure of 3 mmHg. FINDINGS  Left Ventricle: Left ventricular ejection fraction, by estimation, is  20 to 25%. The left ventricle has severely decreased function. The left ventricle demonstrates regional wall motion abnormalities. Severe hypokinesis of the left ventricular, mid-apical anterior wall, inferior wall, lateral wall, septal wall and apical segment. Definity contrast agent was given IV to delineate the left ventricular endocardial borders. The left ventricular internal cavity size was normal in size. There is no left ventricular hypertrophy. Left ventricular diastolic parameters are indeterminate. Right Ventricle: The right ventricular size is normal. No increase in right ventricular wall thickness. Right ventricular systolic function is normal. There is normal pulmonary artery systolic pressure. The tricuspid regurgitant velocity is 2.51 m/s, and  with an assumed right atrial pressure of 3 mmHg, the estimated right ventricular systolic pressure is 28.2 mmHg. Left Atrium: Left atrial size was normal in size. Right Atrium: Right atrial size was normal in size. Pericardium: There is no evidence of pericardial effusion. Mitral Valve: The mitral valve is normal in structure. No evidence of mitral valve regurgitation. No evidence of mitral valve stenosis. MV peak gradient, 5.3 mmHg. The mean mitral valve gradient is 2.0 mmHg. Tricuspid Valve: The tricuspid valve is normal in structure. Tricuspid valve regurgitation is mild . No evidence of tricuspid stenosis. Aortic Valve: The aortic valve is normal in structure. Aortic valve regurgitation is not visualized. Aortic valve sclerosis/calcification is present, without any evidence of aortic stenosis. Aortic valve mean gradient measures 3.0 mmHg. Aortic valve peak  gradient measures 6.2 mmHg. Aortic valve area, by VTI measures 2.31 cm. Pulmonic Valve: The pulmonic valve was normal in structure. Pulmonic valve regurgitation is not visualized. No evidence of pulmonic stenosis. Aorta: The aortic root is normal in size and structure. Venous: The inferior vena cava  was not well visualized. The inferior vena cava is normal in size with greater than 50% respiratory variability, suggesting right atrial pressure of 3 mmHg. IAS/Shunts: No atrial level shunt detected by color flow Doppler.  LEFT VENTRICLE PLAX 2D LVIDd:         4.00 cm   Diastology LVIDs:         3.10 cm   LV e' medial:    12.30 cm/s LV PW:         1.00 cm   LV E/e' medial:  8.1 LV IVS:        0.90 cm   LV e' lateral:   4.57 cm/s LVOT diam:     2.00 cm   LV E/e' lateral: 21.8 LV SV:         52 LV SV Index:   37 LVOT Area:     3.14 cm  RIGHT VENTRICLE RV Basal diam:  3.00 cm RV Mid diam:    1.90 cm RV S prime:     24.60 cm/s TAPSE (M-mode): 1.9 cm LEFT ATRIUM  Index        RIGHT ATRIUM          Index LA diam:        3.20 cm 2.29 cm/m   RA Area:     7.89 cm LA Vol (A2C):   23.4 ml 16.76 ml/m  RA Volume:   15.10 ml 10.82 ml/m LA Vol (A4C):   16.9 ml 12.11 ml/m LA Biplane Vol: 21.4 ml 15.33 ml/m  AORTIC VALVE                    PULMONIC VALVE AV Area (Vmax):    2.19 cm     PV Vmax:       1.19 m/s AV Area (Vmean):   2.04 cm     PV Peak grad:  5.7 mmHg AV Area (VTI):     2.31 cm AV Vmax:           125.00 cm/s AV Vmean:          81.600 cm/s AV VTI:            0.223 m AV Peak Grad:      6.2 mmHg AV Mean Grad:      3.0 mmHg LVOT Vmax:         87.00 cm/s LVOT Vmean:        53.100 cm/s LVOT VTI:          0.164 m LVOT/AV VTI ratio: 0.74  AORTA Ao Root diam: 3.40 cm MITRAL VALVE               TRICUSPID VALVE MV Area (PHT): 7.82 cm    TR Peak grad:   25.2 mmHg MV Area VTI:   3.07 cm    TR Vmax:        251.00 cm/s MV Peak grad:  5.3 mmHg MV Mean grad:  2.0 mmHg    SHUNTS MV Vmax:       1.15 m/s    Systemic VTI:  0.16 m MV Vmean:      60.4 cm/s   Systemic Diam: 2.00 cm MV Decel Time: 97 msec MV E velocity: 99.60 cm/s Lorine Bears MD Electronically signed by Lorine Bears MD Signature Date/Time: 12/03/2022/2:39:31 PM    Final    CARDIAC CATHETERIZATION  Result Date: 12/03/2022   Mid LAD lesion is 30%  stenosed.   There is severe left ventricular systolic dysfunction.   LV end diastolic pressure is severely elevated.   The left ventricular ejection fraction is less than 25% by visual estimate. Minor luminor irregularities, with severe LV systolic dysfunction. Advise  metoprolol/digoxin/lasix drip/aldactone and entresto tomorrow   CT Angio Chest PE W and/or Wo Contrast  Result Date: 12/02/2022 CLINICAL DATA:  Pulmonary embolism (PE) suspected, high prob. Respiratory distress, shortness of breath EXAM: CT ANGIOGRAPHY CHEST WITH CONTRAST TECHNIQUE: Multidetector CT imaging of the chest was performed using the standard protocol during bolus administration of intravenous contrast. Multiplanar CT image reconstructions and MIPs were obtained to evaluate the vascular anatomy. RADIATION DOSE REDUCTION: This exam was performed according to the departmental dose-optimization program which includes automated exposure control, adjustment of the mA and/or kV according to patient size and/or use of iterative reconstruction technique. CONTRAST:  56mL OMNIPAQUE IOHEXOL 350 MG/ML SOLN COMPARISON:  02/02/2022 FINDINGS: Cardiovascular: No filling defects in the pulmonary arteries to suggest pulmonary emboli. Heart is normal size. Aorta is normal caliber. Aortic atherosclerosis and coronary artery calcifications. Mediastinum/Nodes: No mediastinal, hilar, or axillary adenopathy. Trachea and  esophagus are unremarkable. Thyroid unremarkable. Lungs/Pleura: Centrilobular emphysema. No confluent opacities or effusions. Upper Abdomen: No acute findings Musculoskeletal: Chest wall soft tissues are unremarkable. No acute bony abnormality. Review of the MIP images confirms the above findings. IMPRESSION: No evidence of pulmonary embolus. Coronary artery disease. No acute cardiopulmonary disease. Aortic Atherosclerosis (ICD10-I70.0) and Emphysema (ICD10-J43.9). Electronically Signed   By: Rolm Baptise M.D.   On: 12/02/2022 23:02   DG Chest  Port 1 View  Result Date: 12/02/2022 CLINICAL DATA:  Shortness of breath EXAM: PORTABLE CHEST 1 VIEW COMPARISON:  11/18/2020 FINDINGS: Mild hyperinflation/COPD. Heart and mediastinal contours are within normal limits. No focal opacities or effusions. No acute bony abnormality. Aortic atherosclerosis. IMPRESSION: COPD.  No active disease. Electronically Signed   By: Rolm Baptise M.D.   On: 12/02/2022 22:10               LOS: 1 day   Tykel Badie  [facility]   Pager on [email or web address]. If 7PM-7AM, please contact night-coverage at [email or web address]     12/04/2022, 2:47 PM

## 2022-12-04 NOTE — Evaluation (Signed)
Occupational Therapy Evaluation Patient Details Name: Ashley Brooks MRN: [id number] DOB: 02-Jun-1946 Today's Date: 12/04/2022   History of Present Illness Ashley Brooks is a 77 y.o. female with history of COPD who presents with complaints of shortness of breath.  Patient reports over the last 3 to 4 days shortness of breath is not worsening significantly especially with exertion.  She does feel some chest tightness and feels as though she is wheezing.  Occasional dry cough.  No fevers or chills.  Has had 2 shots of Covid vaccine.  No nausea vomiting or diaphoresis.  No calf pain or swelling.   Clinical Impression   Ashley Brooks presents with generalized weakness, limited endurance, and SOB. She comes to [facility] from [place]. She has been Mod-I in ADL performance prior to admission, ambulating with a rollator, denies a recent falls history. During today's evaluation, pt reports feeling "very tired" and "a little lightheaded." Pt is Mod I for bed mobility, SUPV for transfers, fair balance while standing/ambulating w/ RW. She is A&O x 4, O2 sats in upper 90s, denies pain. Pt tires quickly in standing, requests quick return to sitting. Provided educ re: home/routines modifications, energy conservation strategies. Recommend ongoing OT while hospitalized, with DC home with [place]. Pt may also benefit from outpatient cardiopulmonary rehabilitation services.     Recommendations for follow up therapy are one component of a multi-disciplinary discharge planning process, led by the attending physician.  Recommendations may be updated based on patient status, additional functional criteria and insurance authorization.   Follow Up Recommendations  Home health OT     Assistance Recommended at Discharge PRN  Patient can return home with the following Assist for transportation;Help with stairs or ramp for entrance;Assistance with cooking/housework    Functional Status Assessment  Patient has had a  recent decline in their functional status and demonstrates the ability to make significant improvements in function in a reasonable and predictable amount of time.  Equipment Recommendations  None recommended by OT    Recommendations for Other Services Other (comment) (cardiopulmonary rehab? Pt reports [facility] staff can provide transportation for outpatient therapy)     Precautions / Restrictions Precautions Precautions: Fall Restrictions Weight Bearing Restrictions: No      Mobility Bed Mobility Overal bed mobility: Modified Independent             General bed mobility comments: increased time    Transfers Overall transfer level: Needs assistance Equipment used: Rolling walker (2 wheels) Transfers: Sit to/from Stand Sit to Stand: Supervision, From elevated surface           General transfer comment: increased time/effort      Balance Overall balance assessment: Needs assistance Sitting-balance support: Bilateral upper extremity supported, Feet supported Sitting balance-Leahy Scale: Good     Standing balance support: Bilateral upper extremity supported Standing balance-Leahy Scale: Fair Standing balance comment: fatigues quickly in standing                           ADL either performed or assessed with clinical judgement   ADL                                               Vision Baseline Vision/History: 6 Macular Degeneration Patient Visual Report: No change from baseline  Perception     Praxis      Pertinent Vitals/Pain Pain Assessment Pain Assessment: No/denies pain     Hand Dominance     Extremity/Trunk Assessment Upper Extremity Assessment Upper Extremity Assessment: Overall WFL for tasks assessed   Lower Extremity Assessment Lower Extremity Assessment: Overall WFL for tasks assessed   Cervical / Trunk Assessment Cervical / Trunk Assessment: Normal   Communication  Communication Communication: HOH;No difficulties   Cognition Arousal/Alertness: Awake/alert Behavior During Therapy: WFL for tasks assessed/performed Overall Cognitive Status: Within Functional Limits for tasks assessed                                       General Comments      Exercises Other Exercises Other Exercises: Educ re: ECS, home safety, breathing techniques   Shoulder Instructions      Home Living Family/patient expects to be discharged to:: Other (Comment) ([facility])) Living Arrangements: Alone Available Help at Discharge: Available PRN/intermittently;Home health;Personal care attendant Type of Home: Apartment Home Access: Elevator     Home Layout: One level     Bathroom Shower/Tub: Occupational psychologist: Handicapped height (regular toilet with BSC over it) Bathroom Accessibility: Yes   Home Equipment: Conservation officer, nature (2 wheels);Rollator (4 wheels);Shower seat          Prior Functioning/Environment Prior Level of Function : Independent/Modified Independent             Mobility Comments: Ambulates w/ rollator ADLs Comments: Pt performs most B/IADLs INDly. Facility provides 3 meals/day, light housekeeping, and transportation. Pt able to dress, toilet, bathe, do laundry w/o assist. She does not drive, 2/2 visual impairment.        OT Problem List: Decreased strength;Decreased activity tolerance;Impaired balance (sitting and/or standing);Cardiopulmonary status limiting activity      OT Treatment/Interventions: Self-care/ADL training;Therapeutic exercise;Patient/family education;Balance training;Energy conservation;Therapeutic activities    OT Goals(Current goals can be found in the care plan section) Acute Rehab OT Goals Patient Stated Goal: to breath better OT Goal Formulation: With patient Time For Goal Achievement: 12/18/22 Potential to Achieve Goals: Good ADL Goals Pt Will Perform Grooming:  with modified independence;standing (in standing, for 5+ minutes, w/o SOB) Pt Will Transfer to Toilet: with modified independence;stand pivot transfer;ambulating (using LRAD) Additional ADL Goal #1: Pt will identify/demonstrate 2+ energy conservation strategies  OT Frequency: Min 2X/week    Co-evaluation              AM-PAC OT "6 Clicks" Daily Activity     Outcome Measure Help from another person eating meals?: None Help from another person taking care of personal grooming?: A Little Help from another person toileting, which includes using toliet, bedpan, or urinal?: A Little Help from another person bathing (including washing, rinsing, drying)?: A Lot Help from another person to put on and taking off regular upper body clothing?: None Help from another person to put on and taking off regular lower body clothing?: A Lot 6 Click Score: 18   End of Session Equipment Utilized During Treatment: Rolling walker (2 wheels)  Activity Tolerance: Patient tolerated treatment well Patient left: in bed;with call bell/phone within reach;with bed alarm set  OT Visit Diagnosis: Unsteadiness on feet (R26.81);Muscle weakness (generalized) (M62.81)                Time: 8099-8338 OT Time Calculation (min): 18 min Charges:  OT General Charges $  OT Visit: 1 Visit OT Evaluation $OT Eval Low Complexity: 1 Low OT Treatments $Self Care/Home Management : 8-22 mins Josiah Lobo, PhD, MS, OTR/L 12/04/22, 2:35 PM

## 2022-12-05 DIAGNOSIS — I214 Non-ST elevation (NSTEMI) myocardial infarction: Secondary | ICD-10-CM | POA: Diagnosis not present

## 2022-12-05 DIAGNOSIS — I429 Cardiomyopathy, unspecified: Secondary | ICD-10-CM | POA: Diagnosis not present

## 2022-12-05 DIAGNOSIS — I5021 Acute systolic (congestive) heart failure: Secondary | ICD-10-CM | POA: Diagnosis not present

## 2022-12-05 LAB — BASIC METABOLIC PANEL
Anion gap: 10 (ref 5–15)
BUN: 36 mg/dL — ABNORMAL HIGH (ref 8–23)
CO2: 30 mmol/L (ref 22–32)
Calcium: 8.8 mg/dL — ABNORMAL LOW (ref 8.9–10.3)
Chloride: 91 mmol/L — ABNORMAL LOW (ref 98–111)
Creatinine, Ser: 0.98 mg/dL (ref 0.44–1.00)
GFR, Estimated: 60 mL/min — ABNORMAL LOW (ref >60)
Glucose, Bld: 122 mg/dL — ABNORMAL HIGH (ref 70–99)
Potassium: 3.7 mmol/L (ref 3.5–5.1)
Sodium: 131 mmol/L — ABNORMAL LOW (ref 135–145)

## 2022-12-05 LAB — MAGNESIUM: Magnesium: 2.4 mg/dL (ref 1.7–2.4)

## 2022-12-05 LAB — GLUCOSE, CAPILLARY
Glucose-Capillary: 126 mg/dL — ABNORMAL HIGH (ref 70–99)
Glucose-Capillary: 184 mg/dL — ABNORMAL HIGH (ref 70–99)
Glucose-Capillary: 151 mg/dL — ABNORMAL HIGH (ref 70–99)

## 2022-12-05 LAB — LIPOPROTEIN A (LPA): Lipoprotein (a): 40.6 nmol/L — ABNORMAL HIGH (ref <75.0)

## 2022-12-05 MED ORDER — BLISTEX MEDICATED EX OINT: TOPICAL_OINTMENT | CUTANEOUS | Status: DC | PRN

## 2022-12-05 MED ORDER — CHLORHEXIDINE GLUCONATE CLOTH 2 % EX PADS
6.0000 | MEDICATED_PAD | Freq: Every day | CUTANEOUS | Status: DC
  Administered 2022-12-06 – 2022-12-07 (×2): 6 via TOPICAL

## 2022-12-05 MED ORDER — LOSARTAN POTASSIUM 25 MG PO TABS
12.5000 mg | ORAL_TABLET | Freq: Every day | ORAL | Status: DC
  Administered 2022-12-06 – 2022-12-11 (×6): 12.5 mg via ORAL
  Filled 2022-12-05 (×6): qty 1

## 2022-12-05 NOTE — Assessment & Plan Note (Addendum)
I agree with the wound description as outlined.  Continue wound care & offloading pressure areas. Pressure Injury 12/03/22 Toe (Comment  which one) Anterior;Right Stage 2 -  Partial thickness loss of dermis presenting as a shallow open injury with a red, pink wound bed without slough. This is a full thickness wound, NOT a pressure injury (Active)  12/03/22 1200  Location: Toe (Comment  which one)  Location Orientation: Anterior;Right  Staging: Stage 2 -  Partial thickness loss of dermis presenting as a shallow open injury with a red, pink wound bed without slough.  Wound Description (Comments): This is a full thickness wound, NOT a pressure injury  Present on Admission: Yes

## 2022-12-05 NOTE — Assessment & Plan Note (Signed)
-   Continue home meds °

## 2022-12-05 NOTE — Assessment & Plan Note (Addendum)
Left heart cath did not show evidence of CAD.  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 Humphrey Rolls cardiology following - follow up outpatient --Continue metoprolol, Aldactone --Stopped Entresto (2/7) --Started very low dose losartan (2/7) --Resumed IV Lasix 20 mg BID (2/9) with Na improving  --Monitor BP closely --Daily weights, I/O's --Daily orthostatic vitals --PT evaluation - needs SNF

## 2022-12-05 NOTE — Hospital Course (Signed)
Ashley Brooks is a 77 y.o. female with medical history significant for COPD, hyperlipidemia, anxiety, tobacco use disorder, who presented to the hospital with sudden onset of shortness of breath.  Reportedly, when EMS found her, they were empty red wine bottles around her.  She was given IV Solu-Medrol and DuoNeb and was brought to the emergency department for further management.  She is not on home oxygen.     She had elevated troponin and troponin went from 133 to 1,393. She was admitted to the hospital for acute NSTEMI and COPD exacerbation.    She was treated with IV heparin drip, steroids and bronchodilators.  She underwent left heart catheterization with Dr. Humphrey Rolls which did not show any evidence of CAD, but she was found to have severe LV dysfunction with EF less than 20% consistent with acute systolic CHF.    She was diuresed on Lasix drip for a few days, stopped this AM (2/7) for signs of hypovolemia on exam and labs.  Hospital course complicated by hypotension on multiple new cardiac medications.  She is symptomatic at rest when seated with dizziness and lightheadedness.

## 2022-12-05 NOTE — Progress Notes (Signed)
SUBJECTIVE: Patient Ashley Brooks is a 77 y.o. female with medical history significant for COPD, not O2 dependent, HLD, Anxiety , Tobacco abuse  who presents to ED on 12/02/22 via EMS complaining of one day history of shortness of breath not relieved by usual home medications.    Echocardiogram 12/03/22 revealed EF 20-25%. EMS noted home to be littered with empty red wine bottles.   Troponin levels 133>1256>1393>1274. Patient underwent cardiac catheterization on 12/03/22 minor luminor irregularities, with severe LV systolic dysfunction.   Vitals:   12/05/22 0453 12/05/22 0454 12/05/22 0749 12/05/22 0832  BP: 96/60 (!) 92/57  (!) 102/55  Pulse: 81 82  81  Resp: 16   16  Temp: 98.2 F (36.8 C)   98.1 F (36.7 C)  TempSrc:      SpO2: 97% 99% 96% 90%  Weight:      Height:        Intake/Output Summary (Last 24 hours) at 12/05/2022 0923 Last data filed at 12/05/2022 0700 Gross per 24 hour  Intake 261 ml  Output 1850 ml  Net -1589 ml    LABS: Basic Metabolic Panel: Recent Labs    12/04/22 0624 12/05/22 0449  NA 132* 131*  K 4.0 3.7  CL 95* 91*  CO2 27 30  GLUCOSE 124* 122*  BUN 28* 36*  CREATININE 0.84 0.98  CALCIUM 8.7* 8.8*  MG  --  2.4   Liver Function Tests: Recent Labs    12/02/22 2131 12/03/22 0150  AST 21 23  ALT 12 13  ALKPHOS 61 61  BILITOT 0.8 0.9  PROT 7.0 7.0  ALBUMIN 4.3 4.3   No results for input(s): "LIPASE", "AMYLASE" in the last 72 hours. CBC: Recent Labs    12/02/22 2131 12/03/22 0150  WBC 9.8 6.6  HGB 13.4 13.0  HCT 38.3 37.0  MCV 90.3 90.2  PLT 219 157   Cardiac Enzymes: No results for input(s): "CKTOTAL", "CKMB", "CKMBINDEX", "TROPONINI" in the last 72 hours. BNP: Invalid input(s): "POCBNP" D-Dimer: No results for input(s): "DDIMER" in the last 72 hours. Hemoglobin A1C: Recent Labs    12/02/22 2131  HGBA1C 4.5*   Fasting Lipid Panel: Recent Labs    12/03/22 0118  CHOL 169  HDL 78  LDLCALC 84  TRIG 36  CHOLHDL 2.2    Thyroid Function Tests: Recent Labs    12/03/22 0118  TSH 0.802   Anemia Panel: No results for input(s): "VITAMINB12", "FOLATE", "FERRITIN", "TIBC", "IRON", "RETICCTPCT" in the last 72 hours.   PHYSICAL EXAM General: Well developed, well nourished, in no acute distress HEENT:  Normocephalic and atramatic Neck:  No JVD.  Lungs: Clear bilaterally to auscultation and percussion. Heart: HRRR . Normal S1 and S2 without gallops or murmurs.  Abdomen: Bowel sounds are positive, abdomen soft and non-tender  Msk:  Back normal, normal gait. Normal strength and tone for age. Extremities: No clubbing, cyanosis or edema.   Neuro: Alert and oriented X 3. Psych:  Good affect, responds appropriately  TELEMETRY: sinus rhythm, HR 72 bpm  ASSESSMENT AND PLAN: Patient resting comfortably in bed. Denies chest pain, shortness of breath. Complains of nausea. Patient expresses being fearful to return home due to living alone. Patient started on Entresto yesterday for low EF. Patient continues to diurese well on IV Lasix. Will continue to follow.   Principal Problem:   Acute systolic CHF (congestive heart failure) (HCC) Active Problems:   HLD (hyperlipidemia)   COPD exacerbation (HCC)   Depression with anxiety  Elevated troponin   Chronic hypoxic respiratory failure (HCC)   Pressure injury of skin    Krystian Younglove, FNP-C 12/05/2022 9:23 AM

## 2022-12-05 NOTE — Assessment & Plan Note (Signed)
On statin.

## 2022-12-05 NOTE — TOC Progression Note (Signed)
Transition of Care [facility]) - Progression Note    Patient Details  Name: Ashley Brooks MRN: [id number] Date of Birth: Feb 26, 1946  Transition of Care [facility]) CM/SW Contact  Laurena Slimmer, RN Phone Number: 12/05/2022, 4:23 PM  Clinical Narrative:    Spoke with Avie Echevaria Outreach specialist for [facility] regarding patient update. She stated she would come to do an assessment prior to patient coming back.  Patient concerns of affordability relayed to [facility]. She will speak to the Health and safety inspector.         Expected Discharge Plan and Services                                               Social Determinants of Health (SDOH) Interventions SDOH Screenings   Food Insecurity: No Food Insecurity (12/03/2022)  Housing: Low Risk  (12/03/2022)  Transportation Needs: No Transportation Needs (12/03/2022)  Utilities: Not At Risk (12/03/2022)  Tobacco Use: Medium Risk (12/04/2022)    Readmission Risk Interventions     No data to display

## 2022-12-05 NOTE — Progress Notes (Signed)
PT Cancellation Note  Patient Details Name: Ashley Brooks MRN: [id number] DOB: Apr 15, 1946   Cancelled Treatment:    Reason Eval/Treat Not Completed: Other (comment)Pt clearly stated that she did not want to do PT today due to feeling depressed, and that she does move regularly when she is at home.   Bjorn Loser, PTA  12/05/22, 12:04 PM

## 2022-12-05 NOTE — Progress Notes (Signed)
OT Cancellation Note  Patient Details Name: Ashley Brooks MRN: [id number] DOB: 1945-11-18   Cancelled Treatment:    Reason Eval/Treat Not Completed: Other (comment). Pt declined therapy at this time 2/2 "not feeling well" and being "aggravated" with current situation re: possibly needing more assistance upon discharge and not being able to afford it. OT informed pt would reach out to [facility] with concerns to see if they can assist pt. Will re-attempt at later date/time.   Lanelle Bal  [facility] 12/05/2022, 4:07 PM

## 2022-12-05 NOTE — Assessment & Plan Note (Signed)
Attributed to type II NSTEMI (demand ischemia).  Cardiac cath no significant CAD that required any intervention. Mgmt as outlined (see CHF).

## 2022-12-05 NOTE — Assessment & Plan Note (Addendum)
Resolved.  O2 sats stable on room air. Due to combination of COPD and CHF. Oxygen dropped into the low 80s while she was working with PT.  Wean oxygen as tolerated (only use O2 if sats < 90% on room air).  If sats dropping, need to do home O2 qualification documentation.

## 2022-12-05 NOTE — Progress Notes (Signed)
Progress Note   Patient: Ashley Brooks ZOX:[id number] DOB: [birth year] DOA: 12/02/2022     2 DOS: the patient was seen and examined on 12/05/2022   Brief hospital course: Ashley Brooks is a 77 y.o. female with medical history significant for COPD, hyperlipidemia, anxiety, tobacco use disorder, who presented to the hospital with sudden onset of shortness of breath.  Reportedly, when EMS found her, they were empty red wine bottles around her.  She was given IV Solu-Medrol and DuoNeb and was brought to the emergency department for further management.  She is not on home oxygen.     She had elevated troponin and troponin went from 133 to 1,393. She was admitted to the hospital for acute NSTEMI and COPD exacerbation.    She was treated with IV heparin drip, steroids and bronchodilators.  She underwent left heart catheterization with Dr. Humphrey Rolls which did not show any evidence of CAD, but she was found to have severe LV dysfunction with EF less than 20% consistent with acute systolic CHF.    She was diuresed on Lasix drip for a few days, stopped this AM (2/7) for signs of hypovolemia on exam and labs.  Hospital course complicated by hypotension on multiple new cardiac medications.  She is symptomatic at rest when seated with dizziness and lightheadedness.    Assessment and Plan: * Acute systolic CHF (congestive heart failure) (HCC) Left heart cath did not show evidence of CAD.  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 Humphrey Rolls cardiology following - follow up outpatient --Continue metoprolol, Aldactone --Stop Entresto  --Start very low dose losartan --Stop Lasix drip & remove Foley  --Monitor BP closely --Daily weights, I/O's --Check orthostatic vitals --PT evaluation - HH recommended  COPD  exacerbation ([place]) Continue prednisone, doxycycline, nebulizers.  O2 per protocol.  Pressure injury of skin I agree with the wound description as outlined.  Continue wound care & offloading pressure areas. Pressure Injury 12/03/22 Toe (Comment  which one) Anterior;Right Stage 2 -  Partial thickness loss of dermis presenting as a shallow open injury with a red, pink wound bed without slough. This is a full thickness wound, NOT a pressure injury (Active)  12/03/22 1200  Location: Toe (Comment  which one)  Location Orientation: Anterior;Right  Staging: Stage 2 -  Partial thickness loss of dermis presenting as a shallow open injury with a red, pink wound bed without slough.  Wound Description (Comments): This is a full thickness wound, NOT a pressure injury  Present on Admission: Yes      Chronic hypoxic respiratory failure ([place]) Oxygen dropped into the low 80s while she was working with PT.  Oxygen saturation is in the 90s on 2 L/min oxygen. She may need home oxygen.  Will check for home O2 qualification tomorrow.  Elevated troponin Attributed to type II NSTEMI (demand ischemia).  Cardiac cath no significant CAD that required any intervention. Mgmt as outlined (see CHF).  Depression with anxiety Continue home meds  HLD (hyperlipidemia) On statin        Subjective: Pt awake sitting up in bed.  Reports feeling okay.  No CP, SOB or other acute complaints.  She does mention anxiety and that low dose Xanax really helps.  She lives alone and states feeling quite dizzy just sitting in bed.  She reports being quite fearful of falls  and injury.  Physical Exam: Vitals:   12/05/22 0454 12/05/22 0749 12/05/22 0832 12/05/22 1214  BP: (!) 92/57  (!) 102/55 99/60  Pulse: 82  81 80  Resp:   16 16  Temp:   98.1 F (36.7 C) 98.7 F (37.1 C)  TempSrc:      SpO2: 99% 96% 90% 98%  Weight:      Height:       General exam: awake, alert, no acute distress, frail appearing HEENT: dry chapped  lips, dry membranes, hearing grossly normal  Respiratory system: CTAB, no wheezes, rales or rhonchi, normal respiratory effort. Cardiovascular system: normal S1/S2, RRR, no pedal edema.   Gastrointestinal system: soft, NT, ND, no HSM felt, +bowel sounds. Central nervous system: A&O x4. no gross focal neurologic deficits, normal speech Extremities: moves all, no edema, normal tone Skin: poor skin turgor, dry, intact, normal temperature, normal color, No rashes, lesions or ulcers Psychiatry: normal mood, congruent affect, judgement and insight appear normal   Data Reviewed:  Notable labs ---  sodium trending down on lasix drip (133 >> 132 >> 131).  Contract alkalosis developing with bicarb trending up (30 today).  Cr 0.98 from 0.63 two days ago.  BUN 36, glucose 122, Ca 8.8  Family Communication: none  Disposition: Status is: Inpatient Remains inpatient appropriate because: Electrolyte derangements, symptomatic hypotension with medication adjustments needed.     Planned Discharge Destination: Home with Home Health    Time spent: 42 minutes  Author: Ezekiel Slocumb, DO 12/05/2022 2:28 PM  For on call review [email or web address].

## 2022-12-05 NOTE — Assessment & Plan Note (Addendum)
Completed prednisone, doxycycline Continue PRN bronchoilators Resume home regimen at discharge Weaned off oxygen with stable O2 sats.

## 2022-12-05 NOTE — Consult Note (Signed)
   Heart Failure Nurse Navigator Note  HFrEF 20 to 25%.  Left ventricular diastolic parameters are indeterminate.  Left ventricular wall motion abnormalities noted.  Right ventricular systolic function is normal.  She presented to the emergency room with complaints of shortness of breath.  BNP 133.   Comorbidities:  COPD Hyperlipidemia Previous tobacco abuse Anxiety/depression Alcohol abuse  Medications:  Aspirin 81 mg daily Atorvastatin 40 mg daily Digoxin 0.125 mg daily Metoprolol succinate 25 mg daily Entresto 24/26 mg 2 times a day Spironolactone 12 and half milligrams daily Lasix infusion has been discontinued  Labs:  Sodium 131, potassium 3.7, chloride 91, CO2 30, BUN 36, creatinine 0.98, estimated GFR 60 Weight is 44.1 kg Intake 261 mL Output 2050 mL Blood pressure 102/55  Initial meeting with patient on this admission.  She is lying in bed in no acute distress currently remains on O2 per nasal cannula.  Lasix infusion has been discontinued.  States that she resides at [facility] in independent living.  Discussed her diet, she states that her food comes from the dining hall at [facility].  She does not add salt to any of her meals.  Also went over fluid restriction of no more than 64 ounces in 24 hours listed everything that is considered a liquid.  Discussed the importance of daily weights and what to report.'s patient states that she does not have a scale.  Will supply her with  one.  Made aware of her follow-up in the outpatient heart failure clinic on February 21 at 3 PM.  She had no further questions and we will continue to follow along.  He was given the living with heart failure teaching booklet, zone magnet, info on low-sodium and heart failure along with weight chart.  Pricilla Riffle RN [facility]

## 2022-12-06 ENCOUNTER — Inpatient Hospital Stay: Payer: Medicare PPO

## 2022-12-06 DIAGNOSIS — I429 Cardiomyopathy, unspecified: Secondary | ICD-10-CM | POA: Diagnosis not present

## 2022-12-06 DIAGNOSIS — I214 Non-ST elevation (NSTEMI) myocardial infarction: Secondary | ICD-10-CM | POA: Diagnosis not present

## 2022-12-06 DIAGNOSIS — I5021 Acute systolic (congestive) heart failure: Secondary | ICD-10-CM | POA: Diagnosis not present

## 2022-12-06 LAB — OSMOLALITY: Osmolality: 291 mOsm/kg (ref 275–295)

## 2022-12-06 LAB — BASIC METABOLIC PANEL
Anion gap: 11 (ref 5–15)
BUN: 38 mg/dL — ABNORMAL HIGH (ref 8–23)
CO2: 29 mmol/L (ref 22–32)
Calcium: 9.1 mg/dL (ref 8.9–10.3)
Chloride: 91 mmol/L — ABNORMAL LOW (ref 98–111)
Creatinine, Ser: 0.81 mg/dL (ref 0.44–1.00)
GFR, Estimated: >60 mL/min (ref >60)
Glucose, Bld: 114 mg/dL — ABNORMAL HIGH (ref 70–99)
Potassium: 4.2 mmol/L (ref 3.5–5.1)
Sodium: 131 mmol/L — ABNORMAL LOW (ref 135–145)

## 2022-12-06 LAB — GLUCOSE, CAPILLARY
Glucose-Capillary: 118 mg/dL — ABNORMAL HIGH (ref 70–99)
Glucose-Capillary: 178 mg/dL — ABNORMAL HIGH (ref 70–99)
Glucose-Capillary: 157 mg/dL — ABNORMAL HIGH (ref 70–99)

## 2022-12-06 NOTE — Progress Notes (Signed)
Physical Therapy Treatment Patient Details Name: Ashley Brooks MRN: [id number] DOB: 1946/05/03 Today's Date: 12/06/2022   History of Present Illness Ashley Brooks is a 77 y.o. female with history of COPD who presents with complaints of shortness of breath.  Patient reports over the last 3 to 4 days shortness of breath is not worsening significantly especially with exertion.  She does feel some chest tightness and feels as though she is wheezing.  Occasional dry cough.  No fevers or chills.  Has had 2 shots of Covid vaccine.  No nausea vomiting or diaphoresis.  No calf pain or swelling.    PT Comments    Pt seen for PT tx with pt requiring encouragement to participate. Pt is able to complete bed mobility with mod I with HOB elevated & use of bed rails, but requires cuing & CGA for STS from EOB & recliner. Pt initially ambulates but has to stop 2/2 not feeling well & pt unable to elaborate. After seated rest pt able to ambulate into hallway with RW & CGA with 2 standing rest breaks to complete. This PT has concerns about pt's ability to return to ILF & care for herself & pt in agreement. Updated d/c recommendations to STR, as pt would benefit to maximize independence with functional mobility & reduce fall risk prior to return home.  BP checked in LUE: Sitting after 1st gait attempt: 100/69 mmHg MAP 79, HR 87-90 bpm, SPO2 100% on 2L/min Standing at 0: 87/68 mmHg MAP 75 Standing at 2: 101/71 mmHg MAP 81    Recommendations for follow up therapy are one component of a multi-disciplinary discharge planning process, led by the attending physician.  Recommendations may be updated based on patient status, additional functional criteria and insurance authorization.  Follow Up Recommendations  Skilled nursing-short term rehab (<3 hours/day) Can patient physically be transported by private vehicle: Yes   Assistance Recommended at Discharge Intermittent Supervision/Assistance  Patient can return  home with the following A little help with walking and/or transfers;A little help with bathing/dressing/bathroom;Assistance with cooking/housework;Assist for transportation;Help with stairs or ramp for entrance   Equipment Recommendations  None recommended by PT    Recommendations for Other Services       Precautions / Restrictions Precautions Precautions: Fall Restrictions Weight Bearing Restrictions: No     Mobility  Bed Mobility Overal bed mobility: Modified Independent Bed Mobility: Supine to Sit     Supine to sit: Modified independent (Device/Increase time), HOB elevated     General bed mobility comments: use of bed rails    Transfers Overall transfer level: Needs assistance Equipment used: Rolling walker (2 wheels) Transfers: Sit to/from Stand Sit to Stand: Supervision, Min guard           General transfer comment: Education to scoot out to edge of seat, for safe hand placement, extra time to power up to standing with pt leaning posteriorly on sitting surface with BLE.    Ambulation/Gait Ambulation/Gait assistance: Min guard Gait Distance (Feet):  (10 ft + 30 ft) Assistive device: Rolling walker (2 wheels) Gait Pattern/deviations: Decreased step length - right, Decreased step length - left, Decreased stride length, Trunk flexed Gait velocity: decreased     General Gait Details: PT educates pt on need to ambulate within base of AD vs pushing it out in front of her. Pt with slow gait speed, taking ~2 standing rest breaks when walking 30 ft.   Stairs  Wheelchair Mobility    Modified Rankin (Stroke Patients Only)       Balance Overall balance assessment: Needs assistance Sitting-balance support: Bilateral upper extremity supported, Feet supported Sitting balance-Leahy Scale: Good     Standing balance support: Bilateral upper extremity supported, Reliant on assistive device for balance, During functional activity Standing  balance-Leahy Scale: Fair                              Cognition Arousal/Alertness: Awake/alert Behavior During Therapy: WFL for tasks assessed/performed Overall Cognitive Status: Within Functional Limits for tasks assessed                                 General Comments: requires education/encouragement for participation        Exercises      General Comments General comments (skin integrity, edema, etc.): Pt on 2L/min via nasal cannula throughout session.      Pertinent Vitals/Pain Pain Assessment Pain Assessment: Faces Faces Pain Scale: Hurts a little bit Pain Location: back Pain Descriptors / Indicators: Discomfort Pain Intervention(s): Monitored during session    Home Living                          Prior Function            PT Goals (current goals can now be found in the care plan section) Acute Rehab PT Goals Patient Stated Goal: improve her breathing PT Goal Formulation: With patient Time For Goal Achievement: 12/18/22 Potential to Achieve Goals: Fair Progress towards PT goals: Progressing toward goals    Frequency    Min 2X/week      PT Plan Discharge plan needs to be updated    Co-evaluation              AM-PAC PT "6 Clicks" Mobility   Outcome Measure  Help needed turning from your back to your side while in a flat bed without using bedrails?: None Help needed moving from lying on your back to sitting on the side of a flat bed without using bedrails?: A Little Help needed moving to and from a bed to a chair (including a wheelchair)?: A Little Help needed standing up from a chair using your arms (e.g., wheelchair or bedside chair)?: A Little Help needed to walk in hospital room?: A Little Help needed climbing 3-5 steps with a railing? : A Lot 6 Click Score: 18    End of Session Equipment Utilized During Treatment: Oxygen Activity Tolerance: Patient tolerated treatment well;Patient limited by  fatigue Patient left: in chair (in handoff to OT) Nurse Communication:  (c/o symptoms during gait) PT Visit Diagnosis: Other abnormalities of gait and mobility (R26.89);Muscle weakness (generalized) (M62.81)     Time: 0938-1829 PT Time Calculation (min) (ACUTE ONLY): 22 min  Charges:  $Therapeutic Activity: 8-22 mins                     Lavone Nian, PT, DPT 12/06/22, 2:58 PM   [name] 12/06/2022, 2:56 PM

## 2022-12-06 NOTE — Assessment & Plan Note (Addendum)
Sodium trended down on Lasix drip and further since it was stopped, likely hypovolemic from over-diuresis and poor PO intake.Pt appears quite dry on exam.  Serum osmolality also mildly high at 291, also points to dehydration. --Off Lasix drip and diuretics, hold diuretics --Fluid challenge today with 500 cc NS --Repeat BMP at 3 PM today and in AM --Monitor closely given low EF

## 2022-12-06 NOTE — Progress Notes (Signed)
Occupational Therapy Treatment Patient Details Name: Ashley Brooks MRN: [id number] DOB: 23-Sep-1946 Today's Date: 12/06/2022   History of present illness Ashley Brooks is a 77 y.o. female with history of COPD who presents with complaints of shortness of breath.  Patient reports over the last 3 to 4 days shortness of breath is not worsening significantly especially with exertion.  She does feel some chest tightness and feels as though she is wheezing.  Occasional dry cough.  No fevers or chills.  Has had 2 shots of Covid vaccine.  No nausea vomiting or diaphoresis.  No calf pain or swelling.   OT comments  Chart reviewed, pt greeted in room finishing PT session. Pt reports significant fatigue and concern re: ADL completion at home at current level of activity tolerance. Tx session targeting improved activity tolerance for improved Adl task completion. Multiple STS completed with CGA, amb in room approx 10' 2 attempts with CGA, simulated LB bathing completed with MIN A, LB dressing with SET UP. Discussion and education re: discharge recommendations, energy conservation techniques for improved ADL completion, importance of continued nobility as pt reports she has not moved much since admission to hospital. Pt has become increasingly deconditioned with acute hospitalization, STR may be beneficial to improve functional activity tolerance for improved ADL completion. OT will continue to follow acutely.   Recommendations for follow up therapy are one component of a multi-disciplinary discharge planning process, led by the attending physician.  Recommendations may be updated based on patient status, additional functional criteria and insurance authorization.    Follow Up Recommendations  Home health OT (pt may benefit from STR as she has become increased deconditioning from acute hospital stay)     Assistance Recommended at Discharge Intermittent Supervision/Assistance  Patient can return home with  the following  A little help with walking and/or transfers;A little help with bathing/dressing/bathroom;Assistance with cooking/housework;Help with stairs or ramp for entrance   Equipment Recommendations  None recommended by OT    Recommendations for Other Services Other (comment) (potential cardiac rehab)    Precautions / Restrictions Precautions Precautions: Fall Restrictions Weight Bearing Restrictions: No       Mobility Bed Mobility               General bed mobility comments: Nt in chair pre/post session    Transfers Overall transfer level: Needs assistance Equipment used: Rolling walker (2 wheels) Transfers: Sit to/from Stand Sit to Stand: Min guard           General transfer comment: multiple attempts     Balance Overall balance assessment: Needs assistance Sitting-balance support: Bilateral upper extremity supported, Feet supported Sitting balance-Leahy Scale: Good     Standing balance support: Bilateral upper extremity supported, Reliant on assistive device for balance, During functional activity Standing balance-Leahy Scale: Fair                             ADL either performed or assessed with clinical judgement   ADL Overall ADL's : Needs assistance/impaired     Grooming: Set up;Sitting       Lower Body Bathing: Minimal assistance Lower Body Bathing Details (indicate cue type and reason): simulated in sitting     Lower Body Dressing: Set up;Sitting/lateral leans Lower Body Dressing Details (indicate cue type and reason): socks in chair Toilet Transfer: Min guard;Rolling walker (2 wheels);Ambulation Toilet Transfer Details (indicate cue type and reason): simulated  Functional mobility during ADLs: Rolling walker (2 wheels);Min guard (approx 10' in room 2 attempts) General ADL Comments: pt is winded with all activity, reporting SOB;vital signs stable throughout    Extremity/Trunk Assessment               Vision       Perception     Praxis      Cognition Arousal/Alertness: Awake/alert Behavior During Therapy: WFL for tasks assessed/performed Overall Cognitive Status: Within Functional Limits for tasks assessed                                          Exercises Other Exercises Other Exercises: re: role of OT, role of rehab, importance of continued mobility, dc recommendations, energy conservation techniques    Shoulder Instructions       General Comments no report of dizziness during session    Pertinent Vitals/ Pain       Pain Assessment Pain Assessment: No/denies pain  Home Living                                          Prior Functioning/Environment              Frequency  Min 2X/week        Progress Toward Goals  OT Goals(current goals can now be found in the care plan section)  Progress towards OT goals: Progressing toward goals     Plan Discharge plan remains appropriate    Co-evaluation                 AM-PAC OT "6 Clicks" Daily Activity     Outcome Measure   Help from another person eating meals?: None Help from another person taking care of personal grooming?: A Little Help from another person toileting, which includes using toliet, bedpan, or urinal?: A Little Help from another person bathing (including washing, rinsing, drying)?: A Lot Help from another person to put on and taking off regular upper body clothing?: None Help from another person to put on and taking off regular lower body clothing?: A Lot 6 Click Score: 18    End of Session Equipment Utilized During Treatment: Rolling walker (2 wheels)  OT Visit Diagnosis: Unsteadiness on feet (R26.81);Muscle weakness (generalized) (M62.81)   Activity Tolerance Patient limited by fatigue   Patient Left in chair;with call bell/phone within reach   Nurse Communication Mobility status        Time: 4562-5638 OT Time Calculation (min): 18  min  Charges: OT General Charges $OT Visit: 1 Visit OT Treatments $Therapeutic Activity: 8-22 mins  Shanon Payor, OTD OTR/L  12/06/22, 3:49 PM

## 2022-12-06 NOTE — Progress Notes (Addendum)
Progress Note   Patient: Ashley Brooks [id number] DOB: 04-Feb-1946 DOA: 12/02/2022     3 DOS: the patient was seen and examined on 12/06/2022   Brief hospital course: Ashley Brooks is a 77 y.o. female with medical history significant for COPD, hyperlipidemia, anxiety, tobacco use disorder, who presented to the hospital with sudden onset of shortness of breath.  Reportedly, when EMS found her, they were empty red wine bottles around her.  She was given IV Solu-Medrol and DuoNeb and was brought to the emergency department for further management.  She is not on home oxygen.     She had elevated troponin and troponin went from 133 to 1,393. She was admitted to the hospital for acute NSTEMI and COPD exacerbation.    She was treated with IV heparin drip, steroids and bronchodilators.  She underwent left heart catheterization with Dr. Humphrey Rolls which did not show any evidence of CAD, but she was found to have severe LV dysfunction with EF less than 20% consistent with acute systolic CHF.    She was diuresed on Lasix drip for a few days, stopped this AM (2/7) for signs of hypovolemia on exam and labs.  Hospital course complicated by hypotension on multiple new cardiac medications.  She is symptomatic at rest when seated with dizziness and lightheadedness.    Assessment and Plan: * Acute systolic CHF (congestive heart failure) (HCC) Left heart cath did not show evidence of CAD.  2D echo showed EF estimated at 20 to 25%, wall motion abnormalities suggestive of stress-induced cardiomyopathy, indeterminate LV diastolic parameters  2/7: significant hypotension on cardiac meds, symptomatic with dizziness even at rest 2/8: BP's still soft --Dr. Humphrey Rolls cardiology following - follow up outpatient --Continue metoprolol, Aldactone --Stopped Entresto (2/7) --Started very low dose losartan (2/7) --Stop Lasix drip & remove Foley  --Monitor BP closely --Daily weights, I/O's --Check orthostatic vitals --PT  evaluation - HH recommended  Acute hypoxic respiratory failure ([place]) Due to combination of COPD and CHF. Oxygen dropped into the low 80s while she was working with PT.  Wean oxygen as tolerated (only use O2 if sats < 90% on room air).  If sats dropping, need to do home O2 qualification documentation.  Hyponatremia Sodium trended down on Lasix drip, suspect this is hypovolemic as pt appears quite dry on exam. --Off Lasix drip and diuretics --Hold off any fluids given EF 20% --Monitor BMP --Add on serum osmolality  COPD exacerbation (HCC) Continue prednisone, doxycycline, nebulizers.  O2 per protocol.  Pressure injury of skin I agree with the wound description as outlined.  Continue wound care & offloading pressure areas. Pressure Injury 12/03/22 Toe (Comment  which one) Anterior;Right Stage 2 -  Partial thickness loss of dermis presenting as a shallow open injury with a red, pink wound bed without slough. This is a full thickness wound, NOT a pressure injury (Active)  12/03/22 1200  Location: Toe (Comment  which one)  Location Orientation: Anterior;Right  Staging: Stage 2 -  Partial thickness loss of dermis presenting as a shallow open injury with a red, pink wound bed without slough.  Wound Description (Comments): This is a full thickness wound, NOT a pressure injury  Present on Admission: Yes      Elevated troponin Attributed to type II NSTEMI (demand ischemia).  Cardiac cath no significant CAD that required any intervention. Mgmt as outlined (see CHF).  Depression with anxiety Continue home meds  HLD (hyperlipidemia) On statin        Subjective:  Pt awake sitting up in bed.  Reports feeling "okay" but tired today.  She expresses fear of returning home alone and/or not being able to afford the assistance she might need.  Confirms she does not typically require home oxygen at baseline.    Physical Exam: Vitals:   12/06/22 0727 12/06/22 0821 12/06/22 0835 12/06/22 1010   BP:  104/62 94/60   Pulse:  77 68   Resp:  18 16   Temp:   97.9 F (36.6 C)   TempSrc:      SpO2: 98% 100% 98%   Weight:    41.9 kg  Height:       General exam: awake, alert, no acute distress, frail appearing HEENT: dry chapped lips, moist mucus membranes, hearing grossly normal  Respiratory system: CTAB diminished bases, no wheezes, rales or rhonchi, normal respiratory effort. Cardiovascular system: normal S1/S2, RRR, no pedal edema.   Gastrointestinal system: soft, NT, ND Central nervous system: A&O x4. no gross focal neurologic deficits, normal speech Extremities: moves all, no edema, normal tone Skin: dry, intact, normal temperature Psychiatry: normal mood, congruent affect, judgement and insight appear normal   Data Reviewed: Notable labs  ---  sodium stable at 131 today.  Cl 91.  Bicarb improved 30>>29.  Cr 0.98>>0.81 off diuretic. Glucose 114, BUN 38    Family Communication: none   Disposition: Status is: Inpatient Remains inpatient appropriate because: Electrolyte derangements, symptomatic hypotension with medication adjustments needed.     Planned Discharge Destination: Home with Home Health    Time spent: 38 minutes  Author: Ezekiel Slocumb, DO 12/06/2022 1:37 PM  For on call review [email or web address].

## 2022-12-06 NOTE — Progress Notes (Signed)
SUBJECTIVE: Patient is feeling much better denies any chest pain or shortness of breath. HPI   Vitals:   12/06/22 0727 12/06/22 0821 12/06/22 0835 12/06/22 1010  BP:  104/62 94/60   Pulse:  77 68   Resp:  18 16   Temp:   97.9 F (36.6 C)   TempSrc:      SpO2: 98% 100% 98%   Weight:    41.9 kg  Height:        Intake/Output Summary (Last 24 hours) at 12/06/2022 1029 Last data filed at 12/06/2022 0106 Gross per 24 hour  Intake 3 ml  Output 266 ml  Net -263 ml    LABS: Basic Metabolic Panel: Recent Labs    12/05/22 0449 12/06/22 0315  NA 131* 131*  K 3.7 4.2  CL 91* 91*  CO2 30 29  GLUCOSE 122* 114*  BUN 36* 38*  CREATININE 0.98 0.81  CALCIUM 8.8* 9.1  MG 2.4  --    Liver Function Tests: No results for input(s): "AST", "ALT", "ALKPHOS", "BILITOT", "PROT", "ALBUMIN" in the last 72 hours. No results for input(s): "LIPASE", "AMYLASE" in the last 72 hours. CBC: No results for input(s): "WBC", "NEUTROABS", "HGB", "HCT", "MCV", "PLT" in the last 72 hours. Cardiac Enzymes: No results for input(s): "CKTOTAL", "CKMB", "CKMBINDEX", "TROPONINI" in the last 72 hours. BNP: Invalid input(s): "POCBNP" D-Dimer: No results for input(s): "DDIMER" in the last 72 hours. Hemoglobin A1C: No results for input(s): "HGBA1C" in the last 72 hours. Fasting Lipid Panel: No results for input(s): "CHOL", "HDL", "LDLCALC", "TRIG", "CHOLHDL", "LDLDIRECT" in the last 72 hours. Thyroid Function Tests: No results for input(s): "TSH", "T4TOTAL", "T3FREE", "THYROIDAB" in the last 72 hours.  Invalid input(s): "FREET3" Anemia Panel: No results for input(s): "VITAMINB12", "FOLATE", "FERRITIN", "TIBC", "IRON", "RETICCTPCT" in the last 72 hours.   PHYSICAL EXAM General: Well developed, well nourished, in no acute distress HEENT:  Normocephalic and atramatic Neck:  No JVD.  Lungs: Clear bilaterally to auscultation and percussion. Heart: HRRR . Normal S1 and S2 without gallops or murmurs.  Abdomen:  Bowel sounds are positive, abdomen soft and non-tender  Msk:  Back normal, normal gait. Normal strength and tone for age. Extremities: No clubbing, cyanosis or edema.   Neuro: Alert and oriented X 3. 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  Unable to tolerate Entresto at this time.  Continue rest of the medications.  Patient is on 12.5 of losartan and 12.5 of spironolactone and digoxin 0.125 and 25 of metoprolol.  Arrange for patient to be discharged with follow-up in the office next week.   ICD-10-CM   1. COPD exacerbation ([place])  J44.1     2. Elevated troponin  R79.89       Principal Problem:   Acute systolic CHF (congestive heart failure) (HCC) Active Problems:   HLD (hyperlipidemia)   COPD exacerbation (HCC)   Depression with anxiety   Elevated troponin   Chronic hypoxic respiratory failure (HCC)   Pressure injury of skin    Neoma Laming, MD, [facility] 12/06/2022 10:29 AM

## 2022-12-07 DIAGNOSIS — E871 Hypo-osmolality and hyponatremia: Secondary | ICD-10-CM

## 2022-12-07 DIAGNOSIS — J9601 Acute respiratory failure with hypoxia: Secondary | ICD-10-CM | POA: Diagnosis not present

## 2022-12-07 DIAGNOSIS — I429 Cardiomyopathy, unspecified: Secondary | ICD-10-CM | POA: Diagnosis not present

## 2022-12-07 DIAGNOSIS — I5021 Acute systolic (congestive) heart failure: Secondary | ICD-10-CM | POA: Diagnosis not present

## 2022-12-07 DIAGNOSIS — I214 Non-ST elevation (NSTEMI) myocardial infarction: Secondary | ICD-10-CM | POA: Diagnosis not present

## 2022-12-07 DIAGNOSIS — I951 Orthostatic hypotension: Secondary | ICD-10-CM | POA: Clinically undetermined

## 2022-12-07 LAB — MRSA NEXT GEN BY PCR, NASAL: MRSA by PCR Next Gen: DETECTED — AB

## 2022-12-07 LAB — DIGOXIN LEVEL: Digoxin Level: 1 ng/mL (ref 0.8–2.0)

## 2022-12-07 LAB — BASIC METABOLIC PANEL
Anion gap: 8 (ref 5–15)
Anion gap: 13 (ref 5–15)
BUN: 37 mg/dL — ABNORMAL HIGH (ref 8–23)
BUN: 42 mg/dL — ABNORMAL HIGH (ref 8–23)
CO2: 29 mmol/L (ref 22–32)
CO2: 26 mmol/L (ref 22–32)
Calcium: 8.8 mg/dL — ABNORMAL LOW (ref 8.9–10.3)
Calcium: 9 mg/dL (ref 8.9–10.3)
Chloride: 92 mmol/L — ABNORMAL LOW (ref 98–111)
Chloride: 88 mmol/L — ABNORMAL LOW (ref 98–111)
Creatinine, Ser: 0.78 mg/dL (ref 0.44–1.00)
Creatinine, Ser: 0.92 mg/dL (ref 0.44–1.00)
GFR, Estimated: >60 mL/min (ref >60)
GFR, Estimated: >60 mL/min (ref >60)
Glucose, Bld: 114 mg/dL — ABNORMAL HIGH (ref 70–99)
Glucose, Bld: 161 mg/dL — ABNORMAL HIGH (ref 70–99)
Potassium: 3.9 mmol/L (ref 3.5–5.1)
Potassium: 3.5 mmol/L (ref 3.5–5.1)
Sodium: 129 mmol/L — ABNORMAL LOW (ref 135–145)
Sodium: 127 mmol/L — ABNORMAL LOW (ref 135–145)

## 2022-12-07 LAB — GLUCOSE, CAPILLARY
Glucose-Capillary: 94 mg/dL (ref 70–99)
Glucose-Capillary: 127 mg/dL — ABNORMAL HIGH (ref 70–99)
Glucose-Capillary: 172 mg/dL — ABNORMAL HIGH (ref 70–99)
Glucose-Capillary: 130 mg/dL — ABNORMAL HIGH (ref 70–99)

## 2022-12-07 MED ORDER — METOPROLOL SUCCINATE ER 25 MG PO TB24
12.5000 mg | ORAL_TABLET | Freq: Every day | ORAL | Status: DC
  Administered 2022-12-07 – 2022-12-11 (×5): 12.5 mg via ORAL
  Filled 2022-12-07 (×5): qty 1

## 2022-12-07 MED ORDER — FUROSEMIDE 10 MG/ML IJ SOLN
20.0000 mg | Freq: Two times a day (BID) | INTRAMUSCULAR | Status: DC
  Administered 2022-12-07 – 2022-12-09 (×4): 20 mg via INTRAVENOUS
  Filled 2022-12-07 (×4): qty 2

## 2022-12-07 MED ORDER — SODIUM CHLORIDE 0.9 % IV BOLUS
500.0000 mL | Freq: Once | INTRAVENOUS | Status: AC
  Administered 2022-12-07: 500 mL via INTRAVENOUS

## 2022-12-07 NOTE — Progress Notes (Signed)
Progress Note   Patient: Ashley Brooks [id number] DOB: 12-01-1945 DOA: 12/02/2022     4 DOS: the patient was seen and examined on 12/07/2022   Brief hospital course: Ashley Brooks is a 77 y.o. female with medical history significant for COPD, hyperlipidemia, anxiety, tobacco use disorder, who presented to the hospital with sudden onset of shortness of breath.  Reportedly, when EMS found her, they were empty red wine bottles around her.  She was given IV Solu-Medrol and DuoNeb and was brought to the emergency department for further management.  She is not on home oxygen.     She had elevated troponin and troponin went from 133 to 1,393. She was admitted to the hospital for acute NSTEMI and COPD exacerbation.    She was treated with IV heparin drip, steroids and bronchodilators.  She underwent left heart catheterization with Dr. Humphrey Rolls which did not show any evidence of CAD, but she was found to have severe LV dysfunction with EF less than 20% consistent with acute systolic CHF.    She was diuresed on Lasix drip for a few days, stopped this AM (2/7) for signs of hypovolemia on exam and labs.  Hospital course complicated by hypotension on multiple new cardiac medications.  She is symptomatic at rest when seated with dizziness and lightheadedness.    Assessment and Plan: * Acute systolic CHF (congestive heart failure) (HCC) Left heart cath did not show evidence of CAD.  2D echo showed EF estimated at 20 to 25%, wall motion abnormalities suggestive of stress-induced cardiomyopathy, indeterminate LV diastolic parameters  2/7: significant hypotension on cardiac meds, symptomatic with dizziness even at rest 2/8: BP's still soft --Dr. Humphrey Rolls cardiology following - follow up outpatient --Continue metoprolol, Aldactone --Stopped Entresto (2/7) --Started very low dose losartan (2/7) --Stop Lasix drip & remove Foley  --Monitor BP closely --Daily weights, I/O's --Check orthostatic vitals --PT  evaluation - HH recommended  Acute hypoxic respiratory failure ([place]) Due to combination of COPD and CHF. Oxygen dropped into the low 80s while she was working with PT.  Wean oxygen as tolerated (only use O2 if sats < 90% on room air).  If sats dropping, need to do home O2 qualification documentation.  Hyponatremia Sodium trended down on Lasix drip and further since it was stopped, likely hypovolemic from over-diuresis and poor PO intake.Pt appears quite dry on exam.  Serum osmolality also mildly high at 291, also points to dehydration. --Off Lasix drip and diuretics, hold diuretics --Fluid challenge today with 500 cc NS --Repeat BMP at 3 PM today and in AM --Monitor closely given low EF  COPD exacerbation (HCC) Continue prednisone, doxycycline, nebulizers.  O2 per protocol.  Orthostatic hypotension Pt has c/o dizziness and BP's have been soft despite de-escalating cardiac medications. Suspect she was over-diuresed and has ongoing poor PO intake causing hypovolemia.  Sodium also is low. --Daily orthostatic vitals --Reduce Toprol dose 25 >> 12.5 mg --Already on lowest doses losartan, spiro - hope to keep for GDMT with her low EF --May need to start midodrine if persistent once volume status is improved  Pressure injury of skin I agree with the wound description as outlined.  Continue wound care & offloading pressure areas. Pressure Injury 12/03/22 Toe (Comment  which one) Anterior;Right Stage 2 -  Partial thickness loss of dermis presenting as a shallow open injury with a red, pink wound bed without slough. This is a full thickness wound, NOT a pressure injury (Active)  12/03/22 1200  Location: Toe (  Comment  which one)  Location Orientation: Anterior;Right  Staging: Stage 2 -  Partial thickness loss of dermis presenting as a shallow open injury with a red, pink wound bed without slough.  Wound Description (Comments): This is a full thickness wound, NOT a pressure injury  Present on  Admission: Yes      Elevated troponin Attributed to type II NSTEMI (demand ischemia).  Cardiac cath no significant CAD that required any intervention. Mgmt as outlined (see CHF).  Depression with anxiety Continue home meds  HLD (hyperlipidemia) On statin        Subjective: Pt awake sitting up in bed.  Reports feeling okay in general, but quite weak and fatigued.  She acknowledges being too weak to safely function at home alone and is agreeable to rehab.  She reports very poor PO intake for quite awhile but appetite better past day or two.  No other acute complaints.     Physical Exam: Vitals:   12/07/22 0339 12/07/22 0735 12/07/22 0820 12/07/22 0834  BP: 97/60 105/63    Pulse: 71 74    Resp: 19 16    Temp: 97.9 F (36.6 C) 98.1 F (36.7 C)    TempSrc: Oral     SpO2: 95% 99% 99%   Weight:    41.9 kg  Height:       General exam: awake, alert, no acute distress, frail appearing HEENT: wearing glassesmoist mucus membranes, hearing grossly normal  Respiratory system: CTAB diminished bases, normal respiratory effort, on 2 L/min [place] o2. Cardiovascular system: normal S1/S2, RRR, no pedal edema.   Gastrointestinal system: soft, NT, ND Central nervous system: A&O x4. no gross focal neurologic deficits, normal speech Extremities: moves all, no edema, normal tone Skin: dry, intact, normal temperature Psychiatry: normal mood, congruent affect, judgement and insight appear normal   Data Reviewed: Notable labs  ---  sodium 131 >>129 today.  Cl 92, glucose 114, BUN 37, Ca 8.8.  MRSA PCR positive.   Family Communication: none   Disposition: Status is: Inpatient Remains inpatient appropriate because: Electrolyte derangements, symptomatic hypotension with medication adjustments needed.    Needs SNF/rehab placement as pt is too weak and deconditioned to safely return to independent living at this time.   Planned Discharge Destination: Home with Home Health    Time spent: 36  minutes  Author: Ezekiel Slocumb, DO 12/07/2022 2:56 PM  For on call review [email or web address].

## 2022-12-07 NOTE — Assessment & Plan Note (Signed)
Pt has c/o dizziness and BP's have been soft despite de-escalating cardiac medications. Suspect she was over-diuresed and has ongoing poor PO intake causing hypovolemia.  Sodium also is low. --Daily orthostatic vitals --Reduced Toprol dose 25 >> 12.5 mg --Already on lowest doses losartan, spiro - hope to keep for GDMT with her low EF --May need to start midodrine at follow up if this is persistent problem. --CHECK ORTHOSTATIC VITALS AT FOLLOW UP

## 2022-12-07 NOTE — Care Management Important Message (Signed)
Important Message  Patient Details  Name: JEZELLE PARTIPILO MRN: [id number] Date of Birth: 1946/07/11   Medicare Important Message Given:  Yes     Dannette Barbara 12/07/2022, 2:14 PM

## 2022-12-07 NOTE — TOC Initial Note (Signed)
Transition of Care [facility]) - Initial/Assessment Note    Patient Details  Name: Ashley Brooks MRN: [id number] Date of Birth: [birth year]  Transition of Care [facility]) CM/SW Contact:    Tiburcio Bash, LCSW Phone Number: 12/07/2022, 9:25 AM  Clinical Narrative:                  CSW spoke with patient regarding updated PT and OT recommendations of snf, patient is agreeable no preference of facility. Agreeable for referrals to be sent for bed offers.   CSW has sent referrals pending bed offers at this time.   Patient concerned with getting her clothes from apartment at [facility] [place] has lvm with Philippa Sicks with [facility] to coordinate.   Expected Discharge Plan: Skilled Nursing Facility Barriers to Discharge: Continued Medical Work up   Patient Goals and CMS Choice Patient states their goals for this hospitalization and ongoing recovery are:: to go home CMS Medicare.gov Compare Post Acute Care list provided to:: Patient Choice offered to / list presented to : Patient      Expected Discharge Plan and Services       Living arrangements for the past 2 months:  [facility])                                      Prior Living Arrangements/Services Living arrangements for the past 2 months:  [facility]) Lives with:: Facility Resident                   Activities of Daily Living Home Assistive Devices/Equipment: Environmental consultant (specify type) ADL Screening (condition at time of admission) Patient's cognitive ability adequate to safely complete daily activities?: Yes Is the patient deaf or have difficulty hearing?: Yes Does the patient have difficulty seeing, even when wearing glasses/contacts?: No Does the patient have difficulty concentrating, remembering, or making decisions?: No Patient able to express need for assistance with ADLs?: Yes Does the patient have difficulty dressing or bathing?: No Independently performs ADLs?: Yes (appropriate for developmental  age) Communication: Independent Dressing (OT): Independent Is this a change from baseline?: Pre-admission baseline Grooming: Independent Is this a change from baseline?: Pre-admission baseline Feeding: Independent Bathing: Independent Is this a change from baseline?: Pre-admission baseline Toileting: Independent Is this a change from baseline?: Pre-admission baseline In/Out Bed: Independent Is this a change from baseline?: Pre-admission baseline Walks in Home: Independent with device (comment) Is this a change from baseline?: Pre-admission baseline Does the patient have difficulty walking or climbing stairs?: No Weakness of Legs: Both Weakness of Arms/Hands: None  Permission Sought/Granted                  Emotional Assessment              Admission diagnosis:  Elevated troponin [R79.89] COPD exacerbation ([place]) [J44.1] Patient Active Problem List   Diagnosis Date Noted   Orthostatic hypotension 12/07/2022   Acute hypoxic respiratory failure ([place]) 12/04/2022   Pressure injury of skin 0000000   Acute systolic CHF (congestive heart failure) ([place]) 12/03/2022   Elevated troponin 12/03/2022   Hyponatremia 10/01/2020   Depression with anxiety 09/30/2020   Acute on chronic respiratory failure with hypoxia ([place]) 09/30/2020   UTI (urinary tract infection) 09/30/2020   Hypokalemia 09/30/2020   COPD exacerbation ([place]) 08/25/2018   Malnutrition of moderate degree 01/02/2018   COPD with acute exacerbation ([place]) 01/01/2018   Anxiety 01/01/2018  HLD (hyperlipidemia) 01/01/2018   Tobacco use 12/06/2015   PCP:  Juluis Pitch, MD Pharmacy:   [id number] [id number]: [phone]: [phone]    Social Determinants of Health (SDOH) Social History: SDOH Screenings   Food Insecurity: No Food Insecurity (12/03/2022)  Housing: Low Risk  (12/03/2022)  Transportation Needs: No Transportation Needs  (12/03/2022)  Utilities: Not At Risk (12/03/2022)  Tobacco Use: Medium Risk (12/04/2022)   SDOH Interventions:     Readmission Risk Interventions     No data to display

## 2022-12-07 NOTE — Progress Notes (Signed)
SUBJECTIVE: Patient Ashley Brooks is a 77 y.o. female with medical history significant for COPD, not O2 dependent, HLD, Anxiety , Tobacco abuse  who presents to ED on 12/02/22 via EMS complaining of one day history of shortness of breath not relieved by usual home medications.    Echocardiogram 12/03/22 revealed EF 20-25%. EMS noted home to be littered with empty red wine bottles.   Troponin levels 133>1256>1393>1274. Patient underwent cardiac catheterization on 12/03/22 minor luminor irregularities, with severe LV systolic dysfunction.   Vitals:   12/07/22 0339 12/07/22 0735 12/07/22 0820 12/07/22 0834  BP: 97/60 105/63    Pulse: 71 74    Resp: 19 16    Temp: 97.9 F (36.6 C) 98.1 F (36.7 C)    TempSrc: Oral     SpO2: 95% 99% 99%   Weight:    41.9 kg  Height:        Intake/Output Summary (Last 24 hours) at 12/07/2022 0916 Last data filed at 12/07/2022 0600 Gross per 24 hour  Intake 180 ml  Output 350 ml  Net -170 ml    LABS: Basic Metabolic Panel: Recent Labs    12/05/22 0449 12/06/22 0315 12/07/22 0445  NA 131* 131* 129*  K 3.7 4.2 3.9  CL 91* 91* 92*  CO2 30 29 29  $ GLUCOSE 122* 114* 114*  BUN 36* 38* 37*  CREATININE 0.98 0.81 0.78  CALCIUM 8.8* 9.1 8.8*  MG 2.4  --   --    Liver Function Tests: No results for input(s): "AST", "ALT", "ALKPHOS", "BILITOT", "PROT", "ALBUMIN" in the last 72 hours. No results for input(s): "LIPASE", "AMYLASE" in the last 72 hours. CBC: No results for input(s): "WBC", "NEUTROABS", "HGB", "HCT", "MCV", "PLT" in the last 72 hours. Cardiac Enzymes: No results for input(s): "CKTOTAL", "CKMB", "CKMBINDEX", "TROPONINI" in the last 72 hours. BNP: Invalid input(s): "POCBNP" D-Dimer: No results for input(s): "DDIMER" in the last 72 hours. Hemoglobin A1C: No results for input(s): "HGBA1C" in the last 72 hours. Fasting Lipid Panel: No results for input(s): "CHOL", "HDL", "LDLCALC", "TRIG", "CHOLHDL", "LDLDIRECT" in the last 72  hours. Thyroid Function Tests: No results for input(s): "TSH", "T4TOTAL", "T3FREE", "THYROIDAB" in the last 72 hours.  Invalid input(s): "FREET3" Anemia Panel: No results for input(s): "VITAMINB12", "FOLATE", "FERRITIN", "TIBC", "IRON", "RETICCTPCT" in the last 72 hours.   PHYSICAL EXAM General: Well developed, well nourished, in no acute distress HEENT:  Normocephalic and atramatic Neck:  No JVD.  Lungs: Clear bilaterally to auscultation and percussion. Heart: HRRR . Normal S1 and S2 without gallops or murmurs.  Abdomen: Bowel sounds are positive, abdomen soft and non-tender  Msk:  Back normal, normal gait. Normal strength and tone for age. Extremities: No clubbing, cyanosis or edema.   Neuro: Alert and oriented X 3. 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 Unable to tolerate Entresto at this time. Continue rest of the medications. Patient is on 12.5 of losartan and 12.5 of spironolactone and digoxin 0.125 and 25 of metoprolol. Arrange for patient to be discharged with follow-up in the office next week, Thursday, 12/13/22 at 10:00 am.   Principal Problem:   Acute systolic CHF (congestive heart failure) (HCC) Active Problems:   HLD (hyperlipidemia)   COPD exacerbation (HCC)   Depression with anxiety   Hyponatremia   Elevated troponin   Acute hypoxic respiratory failure ([place])  Pressure injury of skin   Orthostatic hypotension    Agustin Swatek, FNP-C 12/07/2022 9:16 AM

## 2022-12-07 NOTE — NC FL2 (Signed)
[place] LEVEL OF CARE FORM     IDENTIFICATION  Patient Name: Ashley Brooks: 04-Jan-1946 Sex: female Admission Date (Current Location): 12/02/2022  [facility]  and [place] Number:  Engineering geologist and Address:  [facility], [address] [place], [place] [address]      Provider Number: [id number]  Attending Physician Name and Address:  Ezekiel Slocumb, DO  Relative Name and Phone Number:       Current Level of Care: Hospital Recommended Level of Care: [place] Prior Approval Number:    Date Approved/Denied:   PASRR Number: [id number]  Discharge Plan: SNF    Current Diagnoses: Patient Active Problem List   Diagnosis Date Noted   Orthostatic hypotension 12/07/2022   Acute hypoxic respiratory failure ([place]) 12/04/2022   Pressure injury of skin 0000000   Acute systolic CHF (congestive heart failure) ([place]) 12/03/2022   Elevated troponin 12/03/2022   Hyponatremia 10/01/2020   Depression with anxiety 09/30/2020   Acute on chronic respiratory failure with hypoxia ([place]) 09/30/2020   UTI (urinary tract infection) 09/30/2020   Hypokalemia 09/30/2020   COPD exacerbation ([place]) 08/25/2018   Malnutrition of moderate degree 01/02/2018   COPD with acute exacerbation ([place]) 01/01/2018   Anxiety 01/01/2018   HLD (hyperlipidemia) 01/01/2018   Tobacco use 12/06/2015    Orientation RESPIRATION BLADDER Height & Weight     Self, Time, Situation, Place  O2 (2L O2 nasal cannula) Continent Weight: 92 lb 4.8 oz (41.9 kg) Height:  5' (152.4 cm)  BEHAVIORAL SYMPTOMS/MOOD NEUROLOGICAL BOWEL NUTRITION STATUS      Continent Diet (see discharge summary)  AMBULATORY STATUS COMMUNICATION OF NEEDS Skin   Limited Assist Verbally Other (Comment) (pressure injury toe stage two)                       Personal Care Assistance Level of Assistance  Bathing, Feeding, Dressing, Total care Bathing Assistance: Limited  assistance Feeding assistance: Independent Dressing Assistance: Limited assistance Total Care Assistance: Limited assistance   Functional Limitations Info  Sight, Hearing, Speech Sight Info: Impaired Hearing Info: Impaired Speech Info: Adequate    SPECIAL CARE FACTORS FREQUENCY  PT (By licensed PT), OT (By licensed OT)     PT Frequency: min 4x weekly OT Frequency: min 4x weekly            Contractures Contractures Info: Not present    Additional Factors Info  Code Status, Allergies Code Status Info: full Allergies Info: no known allergies           Current Medications (12/07/2022):  This is the current hospital active medication list Current Facility-Administered Medications  Medication Dose Route Frequency Provider Last Rate Last Admin   0.9 %  sodium chloride infusion  250 mL Intravenous PRN Neoma Laming A, MD       acetaminophen (TYLENOL) tablet 650 mg  650 mg Oral Q4H PRN Neoma Laming A, MD       ALPRAZolam Duanne Moron) tablet 0.25 mg  0.25 mg Oral TID PRN Myles Rosenthal A, MD   0.25 mg at 12/03/22 0208   arformoterol (BROVANA) nebulizer solution 15 mcg  15 mcg Nebulization BID Myles Rosenthal A, MD   15 mcg at 12/07/22 0820   And   umeclidinium bromide (INCRUSE ELLIPTA) 62.5 MCG/ACT 1 puff  1 puff Inhalation Daily Clance Boll, MD   1 puff at 12/07/22 0845   aspirin chewable tablet 81 mg  81 mg Oral Daily  Clance Boll, MD   81 mg at 12/06/22 [id number]   atorvastatin (LIPITOR) tablet 40 mg  40 mg Oral Daily Myles Rosenthal A, MD   40 mg at 12/06/22 0943   digoxin (LANOXIN) tablet 0.125 mg  0.125 mg Oral Daily Scoggins, Amber, NP   0.125 mg at 12/06/22 0944   doxycycline (VIBRA-TABS) tablet 100 mg  100 mg Oral Q12H Myles Rosenthal A, MD   100 mg at 12/06/22 2126   feeding supplement (ENSURE ENLIVE / ENSURE PLUS) liquid 237 mL  237 mL Oral BID BM Jennye Boroughs, MD   237 mL at 12/06/22 1426   ipratropium-albuterol (DUONEB) 0.5-2.5 (3) MG/3ML nebulizer  solution 3 mL  3 mL Nebulization Q2H PRN Clance Boll, MD   3 mL at 12/03/22 1627   lip balm (BLISTEX) ointment   Topical PRN Nicole Kindred A, DO       losartan (COZAAR) tablet 12.5 mg  12.5 mg Oral Daily Nicole Kindred A, DO   12.5 mg at 12/06/22 0941   metoprolol succinate (TOPROL-XL) 24 hr tablet 12.5 mg  12.5 mg Oral Daily Nicole Kindred A, DO       mupirocin ointment (BACTROBAN) 2 % 1 Application  1 Application Topical Daily Clance Boll, MD   1 Application at 0000000 0955   ondansetron (ZOFRAN) injection 4 mg  4 mg Intravenous Q6H PRN Neoma Laming A, MD   4 mg at 12/04/22 0946   pneumococcal 20-valent conjugate vaccine (PREVNAR 20) injection 0.5 mL  0.5 mL Intramuscular Tomorrow-1000 Myles Rosenthal A, MD       sodium chloride 0.9 % bolus 500 mL  500 mL Intravenous Once Nicole Kindred A, DO       sodium chloride flush (NS) 0.9 % injection 3 mL  3 mL Intravenous Q12H Neoma Laming A, MD   3 mL at 12/06/22 2127   sodium chloride flush (NS) 0.9 % injection 3 mL  3 mL Intravenous PRN Neoma Laming A, MD       spironolactone (ALDACTONE) tablet 12.5 mg  12.5 mg Oral Daily Neoma Laming A, MD   12.5 mg at 12/06/22 [id number]     Discharge Medications: Please see discharge summary for a list of discharge medications.  Relevant Imaging Results:  Relevant Lab Results:   Additional Information [id number]  Tiburcio Bash, LCSW

## 2022-12-08 DIAGNOSIS — I951 Orthostatic hypotension: Secondary | ICD-10-CM | POA: Diagnosis not present

## 2022-12-08 DIAGNOSIS — I5021 Acute systolic (congestive) heart failure: Secondary | ICD-10-CM | POA: Diagnosis not present

## 2022-12-08 DIAGNOSIS — J441 Chronic obstructive pulmonary disease with (acute) exacerbation: Secondary | ICD-10-CM | POA: Diagnosis not present

## 2022-12-08 DIAGNOSIS — E871 Hypo-osmolality and hyponatremia: Secondary | ICD-10-CM | POA: Diagnosis not present

## 2022-12-08 LAB — BASIC METABOLIC PANEL
Anion gap: 8 (ref 5–15)
BUN: 41 mg/dL — ABNORMAL HIGH (ref 8–23)
CO2: 26 mmol/L (ref 22–32)
Calcium: 8.8 mg/dL — ABNORMAL LOW (ref 8.9–10.3)
Chloride: 98 mmol/L (ref 98–111)
Creatinine, Ser: 0.72 mg/dL (ref 0.44–1.00)
GFR, Estimated: >60 mL/min (ref >60)
Glucose, Bld: 126 mg/dL — ABNORMAL HIGH (ref 70–99)
Potassium: 3.1 mmol/L — ABNORMAL LOW (ref 3.5–5.1)
Sodium: 132 mmol/L — ABNORMAL LOW (ref 135–145)

## 2022-12-08 LAB — GLUCOSE, CAPILLARY
Glucose-Capillary: 104 mg/dL — ABNORMAL HIGH (ref 70–99)
Glucose-Capillary: 120 mg/dL — ABNORMAL HIGH (ref 70–99)
Glucose-Capillary: 128 mg/dL — ABNORMAL HIGH (ref 70–99)
Glucose-Capillary: 112 mg/dL — ABNORMAL HIGH (ref 70–99)

## 2022-12-08 MED ORDER — POTASSIUM CHLORIDE CRYS ER 20 MEQ PO TBCR
40.0000 meq | EXTENDED_RELEASE_TABLET | ORAL | Status: AC
  Administered 2022-12-08 (×2): 40 meq via ORAL
  Filled 2022-12-08 (×3): qty 2

## 2022-12-08 MED ORDER — POLYETHYLENE GLYCOL 3350 17 G PO PACK
17.0000 g | PACK | Freq: Every day | ORAL | Status: DC
  Administered 2022-12-08: 17 g via ORAL
  Filled 2022-12-08: qty 1

## 2022-12-08 MED ORDER — SENNOSIDES-DOCUSATE SODIUM 8.6-50 MG PO TABS
1.0000 | ORAL_TABLET | Freq: Two times a day (BID) | ORAL | Status: DC
  Administered 2022-12-08 (×2): 1 via ORAL
  Filled 2022-12-08 (×2): qty 1

## 2022-12-08 MED ORDER — ENOXAPARIN SODIUM 30 MG/0.3ML IJ SOSY
30.0000 mg | PREFILLED_SYRINGE | Freq: Every day | INTRAMUSCULAR | Status: DC
  Administered 2022-12-08 – 2022-12-10 (×3): 30 mg via SUBCUTANEOUS
  Filled 2022-12-08 (×3): qty 0.3

## 2022-12-08 MED ORDER — BISACODYL 5 MG PO TBEC
5.0000 mg | DELAYED_RELEASE_TABLET | Freq: Every day | ORAL | Status: DC | PRN
  Filled 2022-12-08: qty 1

## 2022-12-08 NOTE — Progress Notes (Signed)
Mobility Specialist - Progress Note     12/08/22 1402  Mobility  Activity Ambulated with assistance in room;Ambulated with assistance to bathroom;Stood at bedside;Dangled on edge of bed  Level of Assistance Contact guard assist, steadying assist  Assistive Device Front wheel walker  Distance Ambulated (ft) 10 ft  Activity Response Tolerated well  Mobility Referral Yes  $Mobility charge 1 Mobility   Pt resting in bed on RA upon entry. Pt STS and ambulates to bathroom CGA to RW. RN Notified of pt cramping and constipation. Pt returned to bed and left with needs in reach and bed alarm activated.   Loma Sender Mobility Specialist 12/08/22, 2:04 PM

## 2022-12-08 NOTE — Assessment & Plan Note (Signed)
Due to diuresis. Resolved with replacement. Monitor BMP

## 2022-12-08 NOTE — Progress Notes (Addendum)
Progress Note   Patient: Ashley Brooks [id number] DOB: [birth year] DOA: 12/02/2022     5 DOS: the patient was seen and examined on 12/08/2022   Brief hospital course: Ashley Brooks is a 77 y.o. female with medical history significant for COPD, hyperlipidemia, anxiety, tobacco use disorder, who presented to the hospital with sudden onset of shortness of breath.  Reportedly, when EMS found her, they were empty red wine bottles around her.  She was given IV Solu-Medrol and DuoNeb and was brought to the emergency department for further management.  She is not on home oxygen.     She had elevated troponin and troponin went from 133 to 1,393. She was admitted to the hospital for acute NSTEMI and COPD exacerbation.    She was treated with IV heparin drip, steroids and bronchodilators.  She underwent left heart catheterization with Dr. Humphrey Rolls which did not show any evidence of CAD, but she was found to have severe LV dysfunction with EF less than 20% consistent with acute systolic CHF.    She was diuresed on Lasix drip for a few days, stopped this AM (2/7) for signs of hypovolemia on exam and labs.  Hospital course complicated by hypotension on multiple new cardiac medications.  She is symptomatic at rest when seated with dizziness and lightheadedness.    Assessment and Plan: * Acute systolic CHF (congestive heart failure) (HCC) Left heart cath did not show evidence of CAD.  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 Humphrey Rolls cardiology following - follow up outpatient --Continue metoprolol, Aldactone --Stopped Entresto (2/7) --Started very low dose losartan (2/7) --Resumed IV Lasix 20 mg BID (2/9) with Na improving  --Monitor BP closely --Daily weights, I/O's --Daily  orthostatic vitals --PT evaluation - needs SNF  Acute hypoxic respiratory failure ([place]) Due to combination of COPD and CHF. Oxygen dropped into the low 80s while she was working with PT.  Wean oxygen as tolerated (only use O2 if sats < 90% on room air).  If sats dropping, need to do home O2 qualification documentation.  Hyponatremia Sodium trended down on Lasix drip and further since it was stopped, initially appeared due to hypovolemia from diuresis and poor PO intake.  2/9 - fluid challenge with 500 cc, Na declined to 127. Started back on IV Lasix 20 mg BID 2/10 - sodium improving with diuresis --Continue IV Lasix 20 mg BID --Repeat BMP at 3 PM today and in AM --Monitor closely given low EF  COPD exacerbation (HCC) Continue prednisone, doxycycline, nebulizers.  O2 per protocol.  Orthostatic hypotension Pt has c/o dizziness and BP's have been soft despite de-escalating cardiac medications. Suspect she was over-diuresed and has ongoing poor PO intake causing hypovolemia.  Sodium also is low. --Daily orthostatic vitals --Reduce Toprol dose 25 >> 12.5 mg --Already on lowest doses losartan, spiro - hope to keep for GDMT with her low EF --May need to start midodrine if persistent once volume status is improved  Pressure injury of skin I agree with the wound description as outlined.  Continue wound care & offloading pressure areas. Pressure Injury 12/03/22 Toe (Comment  which one) Anterior;Right Stage 2 -  Partial thickness loss of dermis presenting as a shallow open injury with a red, pink wound bed without slough. This is a full thickness wound, NOT a pressure  injury (Active)  12/03/22 1200  Location: Toe (Comment  which one)  Location Orientation: Anterior;Right  Staging: Stage 2 -  Partial thickness loss of dermis presenting as a shallow open injury with a red, pink wound bed without slough.  Wound Description (Comments): This is a full thickness wound, NOT a pressure injury  Present  on Admission: Yes      Elevated troponin Attributed to type II NSTEMI (demand ischemia).  Cardiac cath no significant CAD that required any intervention. Mgmt as outlined (see CHF).  Hypokalemia Due to diuresis. K 3.1 this AM, oral replacement ordered. Monitor BMP  Depression with anxiety Continue home meds  HLD (hyperlipidemia) On statin         Subjective: Pt awake sitting up in bed. Eating breakfast.  States appetite a little better.  Denies feeling short of breath at rest.  Dizziness seems better, but has not been up yet today.  Otherwise feeling generally weak with no other acute complaints.     Physical Exam: Vitals:   12/08/22 0517 12/08/22 0815 12/08/22 0816 12/08/22 1231  BP: 108/73  113/63 (!) 107/59  Pulse: 86  85 80  Resp: 18  18 18  $ Temp: 98.1 F (36.7 C)  98.3 F (36.8 C) 97.7 F (36.5 C)  TempSrc:   Oral Oral  SpO2: 100% 100% 100% 98%  Weight:   39.5 kg   Height:       General exam: awake, alert, no acute distress, frail appearing HEENT: wearing glasses, moist mucus membranes, hearing grossly normal  Respiratory system: CTAB diminished bases, normal respiratory effort, on room air Cardiovascular system: normal S1/S2, RRR, +JVD, no peripheral edema.   Gastrointestinal system: soft, NT, ND Central nervous system: A&O x4. no gross focal neurologic deficits, normal speech Extremities: moves all, no edema, normal tone Skin: dry, intact, normal temperature Psychiatry: normal mood, congruent affect, judgement and insight appear normal   Data Reviewed: Notable labs  ---  sodium improved 127 >> 132 with diuresis.  Glucose 126, BUN 41, Ca 8.8   Family Communication: none   Disposition: Status is: Inpatient Remains inpatient appropriate because: Remains on IV diuresis and persistent electrolyte derangements.    Needs SNF/rehab placement as pt is too weak and deconditioned to safely return to independent living at this time.   Planned Discharge  Destination: Home with Home Health    Time spent: 36 minutes  Author: Ezekiel Slocumb, DO 12/08/2022 2:54 PM  For on call review [email or web address].

## 2022-12-09 DIAGNOSIS — I5021 Acute systolic (congestive) heart failure: Secondary | ICD-10-CM | POA: Diagnosis not present

## 2022-12-09 LAB — BASIC METABOLIC PANEL
Anion gap: 8 (ref 5–15)
BUN: 35 mg/dL — ABNORMAL HIGH (ref 8–23)
CO2: 26 mmol/L (ref 22–32)
Calcium: 8.9 mg/dL (ref 8.9–10.3)
Chloride: 101 mmol/L (ref 98–111)
Creatinine, Ser: 0.84 mg/dL (ref 0.44–1.00)
GFR, Estimated: >60 mL/min (ref >60)
Glucose, Bld: 117 mg/dL — ABNORMAL HIGH (ref 70–99)
Potassium: 4.1 mmol/L (ref 3.5–5.1)
Sodium: 135 mmol/L (ref 135–145)

## 2022-12-09 LAB — GLUCOSE, CAPILLARY
Glucose-Capillary: 119 mg/dL — ABNORMAL HIGH (ref 70–99)
Glucose-Capillary: 150 mg/dL — ABNORMAL HIGH (ref 70–99)
Glucose-Capillary: 125 mg/dL — ABNORMAL HIGH (ref 70–99)
Glucose-Capillary: 135 mg/dL — ABNORMAL HIGH (ref 70–99)

## 2022-12-09 LAB — MAGNESIUM: Magnesium: 2.4 mg/dL (ref 1.7–2.4)

## 2022-12-09 MED ORDER — IPRATROPIUM-ALBUTEROL 0.5-2.5 (3) MG/3ML IN SOLN
3.0000 mL | RESPIRATORY_TRACT | Status: DC | PRN
  Filled 2022-12-09: qty 3

## 2022-12-09 MED ORDER — FUROSEMIDE 20 MG PO TABS
20.0000 mg | ORAL_TABLET | Freq: Every day | ORAL | Status: DC
  Administered 2022-12-10: 20 mg via ORAL
  Filled 2022-12-09: qty 1

## 2022-12-09 NOTE — Progress Notes (Signed)
Progress Note   Patient: Ashley Brooks [id number] DOB: 08/03/1946 DOA: 12/02/2022     6 DOS: the patient was seen and examined on 12/09/2022   Brief hospital course: Ashley Brooks is a 77 y.o. female with medical history significant for COPD, hyperlipidemia, anxiety, tobacco use disorder, who presented to the hospital with sudden onset of shortness of breath.  Reportedly, when EMS found her, they were empty red wine bottles around her.  She was given IV Solu-Medrol and DuoNeb and was brought to the emergency department for further management.  She is not on home oxygen.     She had elevated troponin and troponin went from 133 to 1,393. She was admitted to the hospital for acute NSTEMI and COPD exacerbation.    She was treated with IV heparin drip, steroids and bronchodilators.  She underwent left heart catheterization with Dr. Humphrey Rolls which did not show any evidence of CAD, but she was found to have severe LV dysfunction with EF less than 20% consistent with acute systolic CHF.    She was diuresed on Lasix drip for a few days, stopped this AM (2/7) for signs of hypovolemia on exam and labs.  Hospital course complicated by hypotension on multiple new cardiac medications.  She is symptomatic at rest when seated with dizziness and lightheadedness.    Assessment and Plan: * Acute systolic CHF (congestive heart failure) (HCC) Left heart cath did not show evidence of CAD.  2D echo showed EF estimated at 20 to 25%, wall motion abnormalities suggestive of stress-induced cardiomyopathy, indeterminate LV diastolic parameters  2/7: significant hypotension on cardiac meds, symptomatic with dizziness even at rest 2/8>>2/10: BP's still soft --Dr. Humphrey Rolls cardiology - follow up outpatient --Continue metoprolol, Aldactone --Stopped Entresto (2/7) --Started very low dose losartan (2/7) --Stop IV Lasix 20 mg BID (resumed 2/9)  --PO Lasix 20 mg daily starting tomorrow --Monitor renal function,  electrolytes closely  --Monitor BP closely --Daily weights, I/O's --Daily orthostatic vitals --PT evaluation - needs SNF  Acute hypoxic respiratory failure ([place]) Resolved.  O2 sats stable on room air. Due to combination of COPD and CHF. Oxygen dropped into the low 80s while she was working with PT.  Wean oxygen as tolerated (only use O2 if sats < 90% on room air).  If sats dropping, need to do home O2 qualification documentation.  Hyponatremia Sodium trended down on Lasix drip and further since it was stopped, initially appeared due to hypovolemia from diuresis and poor PO intake.  2/9 - fluid challenge with 500 cc, Na declined to 127. Started back on IV Lasix 20 mg BID 2/10 - sodium improving with diuresis 2/11 - sodium normalized 135 --Transition IV Lasix 20 mg BID to PO tomorrow --Monitor closely given low EF  COPD exacerbation (HCC) Continue prednisone, doxycycline, nebulizers.  O2 per protocol.  Orthostatic hypotension Pt has c/o dizziness and BP's have been soft despite de-escalating cardiac medications. Suspect she was over-diuresed and has ongoing poor PO intake causing hypovolemia.  Sodium also is low. --Daily orthostatic vitals --Reduce Toprol dose 25 >> 12.5 mg --Already on lowest doses losartan, spiro - hope to keep for GDMT with her low EF --May need to start midodrine if persistent once volume status is improved  Pressure injury of skin I agree with the wound description as outlined.  Continue wound care & offloading pressure areas. Pressure Injury 12/03/22 Toe (Comment  which one) Anterior;Right Stage 2 -  Partial thickness loss of dermis presenting as a shallow open injury  with a red, pink wound bed without slough. This is a full thickness wound, NOT a pressure injury (Active)  12/03/22 1200  Location: Toe (Comment  which one)  Location Orientation: Anterior;Right  Staging: Stage 2 -  Partial thickness loss of dermis presenting as a shallow open injury with a red,  pink wound bed without slough.  Wound Description (Comments): This is a full thickness wound, NOT a pressure injury  Present on Admission: Yes      Elevated troponin Attributed to type II NSTEMI (demand ischemia).  Cardiac cath no significant CAD that required any intervention. Mgmt as outlined (see CHF).  Hypokalemia Due to diuresis. Resolved with replacement. Monitor BMP  Depression with anxiety Continue home meds  HLD (hyperlipidemia) On statin         Subjective: Pt awake sitting up in bed. She reports "I'm here" when asked how she's doing.  She reports high urine output with IV lasix past couple of days and hopes to back off it.  Otherwise she denies acute complaints including dizziness.     Physical Exam: Vitals:   12/09/22 0344 12/09/22 0810 12/09/22 0853 12/09/22 1126  BP: 101/63 117/72  116/69  Pulse: 82 (!) 101  98  Resp: 18 20  18  $ Temp: 98.8 F (37.1 C) 98.4 F (36.9 C)  97.6 F (36.4 C)  TempSrc:  Oral  Oral  SpO2: 96% 94% 93% 95%  Weight:  42.3 kg    Height:       General exam: awake, alert, no acute distress, frail appearing HEENT: wearing glasses, moist mucus membranes, hearing grossly normal  Respiratory system: CTAB diminished bases, normal respiratory effort, on room air Cardiovascular system: normal S1/S2, RRR, no JVD today, no peripheral edema.   Gastrointestinal system: soft, NT, ND Central nervous system: A&O x4. no gross focal neurologic deficits, normal speech Extremities: moves all, no edema, normal tone Skin: dry, intact, normal temperature Psychiatry: normal mood, congruent affect, judgement and insight appear normal   Data Reviewed: Notable labs  ---  sodium improved 127 >> 132 >> 135 with diuresis.  Glucose 117.  Stable renal function   Family Communication: none   Disposition: Status is: Inpatient Remains inpatient appropriate because: Needs SNF/rehab placement as pt is too weak and deconditioned to safely return to  independent living at this time.   Planned Discharge Destination: Home with Home Health    Time spent: 35 minutes  Author: Ezekiel Slocumb, DO 12/09/2022 3:36 PM  For on call review [email or web address].

## 2022-12-09 NOTE — Plan of Care (Signed)
  Problem: Education: Goal: Knowledge of disease or condition will improve Outcome: Progressing Goal: Knowledge of the prescribed therapeutic regimen will improve Outcome: Progressing Goal: Individualized Educational Video(s) Outcome: Progressing   Problem: Respiratory: Goal: Ability to maintain a clear airway will improve Outcome: Progressing   Problem: Education: Goal: Knowledge of General Education information will improve Description: Including pain rating scale, medication(s)/side effects and non-pharmacologic comfort measures Outcome: Progressing

## 2022-12-09 NOTE — Progress Notes (Signed)
PT Cancellation Note  Patient Details Name: Ashley Brooks MRN: [id number] DOB: 25-Jan-1946   Cancelled Treatment:    Reason Eval/Treat Not Completed: Other (comment)   Offered and encouraged session.  Stated she was feeling poorly and having frequent BM's and fatigued.  Will return tomorrow.  Chesley Noon 12/09/2022, 1:22 PM

## 2022-12-10 DIAGNOSIS — I5021 Acute systolic (congestive) heart failure: Secondary | ICD-10-CM | POA: Diagnosis not present

## 2022-12-10 LAB — BASIC METABOLIC PANEL
Anion gap: 7 (ref 5–15)
BUN: 30 mg/dL — ABNORMAL HIGH (ref 8–23)
CO2: 24 mmol/L (ref 22–32)
Calcium: 8.4 mg/dL — ABNORMAL LOW (ref 8.9–10.3)
Chloride: 105 mmol/L (ref 98–111)
Creatinine, Ser: 0.6 mg/dL (ref 0.44–1.00)
GFR, Estimated: >60 mL/min (ref >60)
Glucose, Bld: 109 mg/dL — ABNORMAL HIGH (ref 70–99)
Potassium: 3.8 mmol/L (ref 3.5–5.1)
Sodium: 136 mmol/L (ref 135–145)

## 2022-12-10 LAB — GLUCOSE, CAPILLARY
Glucose-Capillary: 138 mg/dL — ABNORMAL HIGH (ref 70–99)
Glucose-Capillary: 146 mg/dL — ABNORMAL HIGH (ref 70–99)
Glucose-Capillary: 119 mg/dL — ABNORMAL HIGH (ref 70–99)
Glucose-Capillary: 150 mg/dL — ABNORMAL HIGH (ref 70–99)

## 2022-12-10 MED ORDER — POLYETHYLENE GLYCOL 3350 17 G PO PACK
17.0000 g | PACK | Freq: Every day | ORAL | Status: DC | PRN
  Administered 2022-12-10: 17 g via ORAL
  Filled 2022-12-10: qty 1

## 2022-12-10 MED ORDER — SENNOSIDES-DOCUSATE SODIUM 8.6-50 MG PO TABS: 1.0000 | ORAL_TABLET | Freq: Two times a day (BID) | ORAL | Status: DC | PRN

## 2022-12-10 NOTE — TOC Progression Note (Signed)
Transition of Care [facility]) - Progression Note    Patient Details  Name: NICKEY CREASEY MRN: [id number] Date of Birth: Oct 18, 1946  Transition of Care [facility]) CM/SW [place], [place] Phone Number: 12/10/2022, 12:12 PM  Clinical Narrative:     Patient reports she wishes to return to [facility] with home health, CSW informed patient will need to work with PT and OT today to see if improved to return to [facility] with [facility]. PT/OT updated.  Expected Discharge Plan: [place] Barriers to Discharge: Continued Medical Work up  Expected Discharge Plan and [place] arrangements for the past 2 months:  [facility])                                       Social Determinants of Health (SDOH) Interventions [place]: No Food Insecurity (12/03/2022)  Housing: Low Risk  (12/03/2022)  Transportation Needs: No Transportation Needs (12/03/2022)  Utilities: Not At Risk (12/03/2022)  Tobacco Use: Medium Risk (12/04/2022)    Readmission Risk Interventions     No data to display

## 2022-12-10 NOTE — Progress Notes (Signed)
Physical Therapy Treatment Patient Details Name: Ashley Brooks MRN: [id number] DOB: [birth year] Today's Date: 12/10/2022   History of Present Illness Ashley Brooks is a 77 y.o. female with history of COPD who presents with complaints of shortness of breath.  Patient reports over the last 3 to 4 days shortness of breath is not worsening significantly especially with exertion.  She does feel some chest tightness and feels as though she is wheezing.  Occasional dry cough.  No fevers or chills.  Has had 2 shots of Covid vaccine.  No nausea vomiting or diaphoresis.  No calf pain or swelling.    PT Comments    Pt agrees to session.  To EOB with no assist. Stood to RW and supervision and is able to walk 50' x 1 then 20' x 1 with RW and min guard.  Limited by SOB.  Sats 97% on room air.  Stated she does use a nebulizer at home but has not had a breathing treatment lately.  Discussed with RN.  She requests to use BSC to void.  OT arrives in room for care.  Discussed home vs SNF with pt.  She does seem torn on disposition.  Overall does well but is limited by SOB and general fatigue.   She stated she is able to have her meals brought to her apartment if needed.  Pt is left with OT and encouraged to continue to talk with her about disposition to help make a decision.    SNF would be appropriate if she opts.  If she chooses home, HHPT/OT services would be appropriate.  She does have a rollator at home that she is comfortable using and does not need other equipment.   Recommendations for follow up therapy are one component of a multi-disciplinary discharge planning process, led by the attending physician.  Recommendations may be updated based on patient status, additional functional criteria and insurance authorization.  Follow Up Recommendations  Skilled nursing-short term rehab (<3 hours/day)     Assistance Recommended at Discharge Intermittent Supervision/Assistance  Patient can return home with  the following A little help with walking and/or transfers;A little help with bathing/dressing/bathroom;Assistance with cooking/housework;Assist for transportation;Help with stairs or ramp for entrance   Equipment Recommendations  None recommended by PT    Recommendations for Other Services       Precautions / Restrictions Precautions Precautions: Fall Restrictions Weight Bearing Restrictions: No     Mobility  Bed Mobility Overal bed mobility: Modified Independent               Patient Response: Cooperative  Transfers Overall transfer level: Needs assistance Equipment used: Rolling walker (2 wheels) Transfers: Sit to/from Stand Sit to Stand: Supervision                Ambulation/Gait Ambulation/Gait assistance: Min guard Gait Distance (Feet): 50 Feet Assistive device: Rolling walker (2 wheels) Gait Pattern/deviations: Decreased step length - right, Decreased step length - left, Decreased stride length, Trunk flexed Gait velocity: decreased     General Gait Details: 50' x 1, 20' x 1  rest in chair outside door placed prior to session   Stairs             Wheelchair Mobility    Modified Rankin (Stroke Patients Only)       Balance Overall balance assessment: Needs assistance Sitting-balance support: Feet supported Sitting balance-Leahy Scale: Good     Standing balance support: Bilateral upper extremity supported, Reliant on assistive device for  balance, During functional activity Standing balance-Leahy Scale: Fair                              Cognition Arousal/Alertness: Awake/alert Behavior During Therapy: WFL for tasks assessed/performed Overall Cognitive Status: Within Functional Limits for tasks assessed                                          Exercises      General Comments        Pertinent Vitals/Pain Pain Assessment Pain Assessment: No/denies pain    Home Living                           Prior Function            PT Goals (current goals can now be found in the care plan section) Progress towards PT goals: Progressing toward goals    Frequency    Min 2X/week      PT Plan Current plan remains appropriate    Co-evaluation              AM-PAC PT "6 Clicks" Mobility   Outcome Measure  Help needed turning from your back to your side while in a flat bed without using bedrails?: None Help needed moving from lying on your back to sitting on the side of a flat bed without using bedrails?: None Help needed moving to and from a bed to a chair (including a wheelchair)?: A Little Help needed standing up from a chair using your arms (e.g., wheelchair or bedside chair)?: A Little Help needed to walk in hospital room?: A Little Help needed climbing 3-5 steps with a railing? : A Little 6 Click Score: 20    End of Session Equipment Utilized During Treatment: Gait belt Activity Tolerance: Patient tolerated treatment well;Other (comment) (limited by SOB - sats 97% room air) Patient left: Other (comment) (BSC with OT in room to take over care.)   PT Visit Diagnosis: Other abnormalities of gait and mobility (R26.89);Muscle weakness (generalized) (M62.81)     Time: 1206-1220 PT Time Calculation (min) (ACUTE ONLY): 14 min  Charges:  $Gait Training: 8-22 mins                   Chesley Noon, PTA 12/10/22, 2:34 PM

## 2022-12-10 NOTE — Progress Notes (Signed)
Progress Note   Patient: Ashley Brooks [id number] DOB: May 23, 1946 DOA: 12/02/2022     7 DOS: the patient was seen and examined on 12/10/2022   Brief hospital course: Ashley Brooks is a 77 y.o. female with medical history significant for COPD, hyperlipidemia, anxiety, tobacco use disorder, who presented to the hospital with sudden onset of shortness of breath.  Reportedly, when EMS found her, they were empty red wine bottles around her.  She was given IV Solu-Medrol and DuoNeb and was brought to the emergency department for further management.  She is not on home oxygen.     She had elevated troponin and troponin went from 133 to 1,393. She was admitted to the hospital for acute NSTEMI and COPD exacerbation.    She was treated with IV heparin drip, steroids and bronchodilators.  She underwent left heart catheterization with Dr. Humphrey Rolls which did not show any evidence of CAD, but she was found to have severe LV dysfunction with EF less than 20% consistent with acute systolic CHF.    She was diuresed on Lasix drip for a few days, stopped this AM (2/7) for signs of hypovolemia on exam and labs.  Hospital course complicated by hypotension on multiple new cardiac medications.  She is symptomatic at rest when seated with dizziness and lightheadedness.    Assessment and Plan: * Acute systolic CHF (congestive heart failure) (HCC) Left heart cath did not show evidence of CAD.  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  Dizziness improved. --Dr. Humphrey Rolls cardiology - follow up outpatient --Continue metoprolol, Aldactone --Stopped Entresto (2/7) --Started very low dose losartan (2/7) --Stop IV Lasix 20 mg BID (resumed 2/9 >>  2/11)  --Start PO Lasix 20 mg daily (2/12) --Monitor renal function, electrolytes closely  --Monitor BP closely --Daily weights, I/O's --Daily orthostatic vitals --PT evaluation - needs SNF  Acute hypoxic respiratory failure ([place]) Resolved.  O2 sats stable on room air. Due to combination of COPD and CHF. Oxygen dropped into the low 80s while she was working with PT.  Wean oxygen as tolerated (only use O2 if sats < 90% on room air).  If sats dropping, need to do home O2 qualification documentation.  Hyponatremia Sodium trended down on Lasix drip and further since it was stopped, initially appeared due to hypovolemia from diuresis and poor PO intake.  2/9 - fluid challenge with 500 cc, Na declined to 127. Started back on IV Lasix 20 mg BID 2/10 - sodium improving with diuresis 2/11 - sodium normalized 135 --Transition IV Lasix 20 mg BID to PO tomorrow --Monitor closely given low EF  COPD exacerbation (HCC) Completed prednisone, doxycycline Continue PRN bronchoilators Brovana & Incruse O2 only if sats < 90% on room air.  Orthostatic hypotension Pt has c/o dizziness and BP's have been soft despite de-escalating cardiac medications. Suspect she was over-diuresed and has ongoing poor PO intake causing hypovolemia.  Sodium also is low. --Daily orthostatic vitals --Reduce Toprol dose 25 >> 12.5 mg --Already on lowest doses losartan, spiro - hope to keep for GDMT with her low EF --May need to start midodrine if persistent once volume status is improved  Pressure injury of skin I agree with the wound description as outlined.  Continue wound care & offloading pressure areas. Pressure  Injury 12/03/22 Toe (Comment  which one) Anterior;Right Stage 2 -  Partial thickness loss of dermis presenting as a shallow open injury with a red, pink wound bed without slough. This is a full thickness wound, NOT a pressure injury (Active)  12/03/22 1200  Location: Toe (Comment  which one)  Location  Orientation: Anterior;Right  Staging: Stage 2 -  Partial thickness loss of dermis presenting as a shallow open injury with a red, pink wound bed without slough.  Wound Description (Comments): This is a full thickness wound, NOT a pressure injury  Present on Admission: Yes      Elevated troponin Attributed to type II NSTEMI (demand ischemia).  Cardiac cath no significant CAD that required any intervention. Mgmt as outlined (see CHF).  Hypokalemia Due to diuresis. Resolved with replacement. Monitor BMP  Depression with anxiety Continue home meds  HLD (hyperlipidemia) On statin         Subjective: Pt awake sitting up in bed. She reports doing okay overall.  Reported loose stools from stool softeners, let her know I made those as needed so we won't give them unless she needs it.  She does not have batteries for her hearing aids.  No acute complaints at this time.  She reports not feeling dizzy like she did before when up to bedside commode this AM.  Asking when she might be released.    Physical Exam: Vitals:   12/10/22 0500 12/10/22 0731 12/10/22 0834 12/10/22 0932  BP:    96/66  Pulse:    99  Resp:    16  Temp:    97.7 F (36.5 C)  TempSrc:    Oral  SpO2:  96%  98%  Weight: 42.2 kg  42.2 kg   Height:       General exam: awake, alert, no acute distress, frail appearing HEENT: wearing glasses, moist mucus membranes, hearing grossly normal  Respiratory system: CTAB diminished bases, normal respiratory effort, on room air Cardiovascular system: normal S1/S2, RRR, no JVD today, no peripheral edema.   Gastrointestinal system: soft, NT, ND Central nervous system: A&O x4. no gross focal neurologic deficits, normal speech Extremities: moves all, no edema, normal tone Skin: dry, intact, normal temperature Psychiatry: normal mood, congruent affect, judgement and insight appear normal   Data Reviewed: Notable labs  ---  normal BMP except glucose 109, BUN 30, Ca   8.4   Family Communication: none   Disposition: Status is: Inpatient Remains inpatient appropriate because: Needs SNF/rehab placement as pt is too weak and deconditioned to safely return to independent living at this time.   Planned Discharge Destination: Home with Home Health    Time spent: 35 minutes  Author: Ezekiel Slocumb, DO 12/10/2022 1:30 PM  For on call review [email or web address].

## 2022-12-10 NOTE — Care Management Important Message (Signed)
Important Message  Patient Details  Name: Ashley Brooks MRN: [id number] Date of Birth: 04-12-1946   Medicare Important Message Given:  Yes     Dannette Barbara 12/10/2022, 3:01 PM

## 2022-12-10 NOTE — Progress Notes (Signed)
Occupational Therapy Treatment Patient Details Name: Ashley Brooks MRN: [id number] DOB: April 30, 1946 Today's Date: 12/10/2022   History of present illness Ashley Brooks is a 77 y.o. female with history of COPD who presents with complaints of shortness of breath.  Patient reports over the last 3 to 4 days shortness of breath is not worsening significantly especially with exertion.  She does feel some chest tightness and feels as though she is wheezing.  Occasional dry cough.  No fevers or chills.  Has had 2 shots of Covid vaccine.  No nausea vomiting or diaphoresis.  No calf pain or swelling.   OT comments  Pt received seated on BSC; PT exiting the room. Appearing alert and well; on room air; willing to work with OT on ADLs and functional mobility in the room. Performed toilet hygiene with set up; walked around bed with RW supervision. Discussed energy conservation strategies and choice of rehab vs HH; pt still thinking about options. Concern for pt being able to do laundry. See flowsheet below for further details of session. Left with BIL LE elevated in recliner, chair alarm on, with all needs in reach.  Patient will benefit from continued OT while in acute care.    Recommendations for follow up therapy are one component of a multi-disciplinary discharge planning process, led by the attending physician.  Recommendations may be updated based on patient status, additional functional criteria and insurance authorization.    Follow Up Recommendations  Home health OT (although is appropriate for STR due to decreased activity tolerance and challenges with IADLs.)     Assistance Recommended at Discharge Intermittent Supervision/Assistance  Patient can return home with the following  A little help with walking and/or transfers;A little help with bathing/dressing/bathroom;Assistance with cooking/housework;Help with stairs or ramp for entrance   Equipment Recommendations  None recommended by OT     Recommendations for Other Services      Precautions / Restrictions Precautions Precautions: Fall Restrictions Weight Bearing Restrictions: No       Mobility Bed Mobility                    Transfers                         Balance                                           ADL either performed or assessed with clinical judgement   ADL Overall ADL's : Needs assistance/impaired                         Toilet Transfer: Set up;Supervision/safety;Rolling walker (2 wheels) Toilet Transfer Details (indicate cue type and reason): Pt received on BSC when OT arrived Toileting- Clothing Manipulation and Hygiene: Set up;Sitting/lateral lean Toileting - Clothing Manipulation Details (indicate cue type and reason): urine hygiene     Functional mobility during ADLs: Supervision/safety;Rolling walker (2 wheels) General ADL Comments: Pt walked from [facility] on one side of the bed to the recliner on the other side of the bed; RW, no physical assist required; no loss of balance; able to navigate RW through tight space between bed and tray table to get to recliner.    Extremity/Trunk Assessment Upper Extremity Assessment Upper Extremity Assessment: Overall WFL for tasks assessed   Lower Extremity Assessment Lower  Extremity Assessment: Defer to PT evaluation        Vision       Perception     Praxis      Cognition Arousal/Alertness: Awake/alert Behavior During Therapy: [facility] for tasks assessed/performed Overall Cognitive Status: Within Functional Limits for tasks assessed                                          Exercises      Shoulder Instructions       General Comments Pt on room air; not short of breath during session.    Pertinent Vitals/ Pain       Pain Assessment Pain Assessment: No/denies pain  Home Living                                          Prior Functioning/Environment               Frequency  Min 2X/week        Progress Toward Goals  OT Goals(current goals can now be found in the care plan section)  Progress towards OT goals: Progressing toward goals  Acute Rehab OT Goals Patient Stated Goal: Get better OT Goal Formulation: With patient Time For Goal Achievement: 12/18/22 Potential to Achieve Goals: Good ADL Goals Pt Will Perform Grooming: with modified independence;standing Pt Will Transfer to Toilet: with modified independence;stand pivot transfer;ambulating Additional ADL Goal #1: Pt will identify/demonstrate 2+ energy conservation strategies  Plan Discharge plan remains appropriate    Co-evaluation                 AM-PAC OT "6 Clicks" Daily Activity     Outcome Measure   Help from another person eating meals?: None Help from another person taking care of personal grooming?: None Help from another person toileting, which includes using toliet, bedpan, or urinal?: None Help from another person bathing (including washing, rinsing, drying)?: A Lot Help from another person to put on and taking off regular upper body clothing?: None Help from another person to put on and taking off regular lower body clothing?: A Lot 6 Click Score: 20    End of Session Equipment Utilized During Treatment: Rolling walker (2 wheels)  OT Visit Diagnosis: Unsteadiness on feet (R26.81);Muscle weakness (generalized) (M62.81)   Activity Tolerance Patient limited by fatigue   Patient Left in chair;with call bell/phone within reach;with chair alarm set   Nurse Communication Mobility status        Time: 1413-1430 OT Time Calculation (min): 17 min  Charges: OT General Charges $OT Visit: 1 Visit OT Treatments $Self Care/Home Management : 8-22 mins  Waymon Amato, MS, OTR/L  Vania Rea 12/10/2022, 3:44 PM

## 2022-12-11 ENCOUNTER — Encounter: Payer: Self-pay | Admitting: Internal Medicine

## 2022-12-11 DIAGNOSIS — I5021 Acute systolic (congestive) heart failure: Secondary | ICD-10-CM | POA: Diagnosis not present

## 2022-12-11 LAB — BASIC METABOLIC PANEL
Anion gap: 6 (ref 5–15)
BUN: 17 mg/dL (ref 8–23)
CO2: 25 mmol/L (ref 22–32)
Calcium: 8.3 mg/dL — ABNORMAL LOW (ref 8.9–10.3)
Chloride: 101 mmol/L (ref 98–111)
Creatinine, Ser: 0.59 mg/dL (ref 0.44–1.00)
GFR, Estimated: >60 mL/min (ref >60)
Glucose, Bld: 118 mg/dL — ABNORMAL HIGH (ref 70–99)
Potassium: 3.6 mmol/L (ref 3.5–5.1)
Sodium: 132 mmol/L — ABNORMAL LOW (ref 135–145)

## 2022-12-11 LAB — GLUCOSE, CAPILLARY
Glucose-Capillary: 115 mg/dL — ABNORMAL HIGH (ref 70–99)
Glucose-Capillary: 109 mg/dL — ABNORMAL HIGH (ref 70–99)

## 2022-12-11 MED ORDER — ACETAMINOPHEN 325 MG PO TABS: 650.0000 mg | ORAL_TABLET | ORAL | Status: AC | PRN

## 2022-12-11 MED ORDER — ATORVASTATIN CALCIUM 40 MG PO TABS: 40.0000 mg | ORAL_TABLET | Freq: Every day | ORAL | 1 refills | Status: AC

## 2022-12-11 MED ORDER — DIGOXIN 125 MCG PO TABS: 0.1250 mg | ORAL_TABLET | Freq: Every day | ORAL | 1 refills | Status: DC

## 2022-12-11 MED ORDER — ALPRAZOLAM 0.25 MG PO TABS: 0.2500 mg | ORAL_TABLET | Freq: Three times a day (TID) | ORAL | 0 refills | Status: DC | PRN

## 2022-12-11 MED ORDER — LOSARTAN POTASSIUM 25 MG PO TABS: 12.5000 mg | ORAL_TABLET | Freq: Every day | ORAL | 1 refills | Status: AC

## 2022-12-11 MED ORDER — SPIRONOLACTONE 25 MG PO TABS: 12.5000 mg | ORAL_TABLET | Freq: Every day | ORAL | 1 refills | Status: DC

## 2022-12-11 MED ORDER — IPRATROPIUM-ALBUTEROL 0.5-2.5 (3) MG/3ML IN SOLN: 3.0000 mL | RESPIRATORY_TRACT | 1 refills | Status: AC | PRN

## 2022-12-11 MED ORDER — MUPIROCIN 2 % EX OINT: 1.0000 | TOPICAL_OINTMENT | Freq: Every day | CUTANEOUS | 0 refills | Status: DC

## 2022-12-11 MED ORDER — ASPIRIN 81 MG PO CHEW: 81.0000 mg | CHEWABLE_TABLET | Freq: Every day | ORAL | 1 refills | Status: DC

## 2022-12-11 MED ORDER — FUROSEMIDE 40 MG PO TABS
40.0000 mg | ORAL_TABLET | Freq: Every day | ORAL | Status: DC
  Administered 2022-12-11: 40 mg via ORAL
  Filled 2022-12-11: qty 1

## 2022-12-11 MED ORDER — METOPROLOL SUCCINATE ER 25 MG PO TB24: 12.5000 mg | ORAL_TABLET | Freq: Every day | ORAL | 1 refills | Status: DC

## 2022-12-11 MED ORDER — SENNOSIDES-DOCUSATE SODIUM 8.6-50 MG PO TABS: 1.0000 | ORAL_TABLET | Freq: Two times a day (BID) | ORAL | 0 refills | Status: DC | PRN

## 2022-12-11 MED ORDER — FUROSEMIDE 20 MG PO TABS: 20.0000 mg | ORAL_TABLET | Freq: Two times a day (BID) | ORAL | 1 refills | Status: DC

## 2022-12-11 MED ORDER — ENSURE ENLIVE PO LIQD: 237.0000 mL | Freq: Two times a day (BID) | ORAL | 12 refills | Status: AC

## 2022-12-11 NOTE — Plan of Care (Signed)
Problem: Education: Goal: Knowledge of disease or condition will improve 12/11/2022 1305 by Shauna Hugh, RN Outcome: Adequate for Discharge 12/11/2022 [phone] by Shauna Hugh, RN Outcome: Progressing Goal: Knowledge of the prescribed therapeutic regimen will improve 12/11/2022 1305 by Shauna Hugh, RN Outcome: Adequate for Discharge 12/11/2022 0918 by Shauna Hugh, RN Outcome: Progressing Goal: Individualized Educational Video(s) 12/11/2022 1305 by Shauna Hugh, RN Outcome: Adequate for Discharge 12/11/2022 0918 by Shauna Hugh, RN Outcome: Progressing   Problem: Activity: Goal: Ability to tolerate increased activity will improve 12/11/2022 1305 by Shauna Hugh, RN Outcome: Adequate for Discharge 12/11/2022 [phone] by Shauna Hugh, RN Outcome: Progressing Goal: Will verbalize the importance of balancing activity with adequate rest periods 12/11/2022 1305 by Shauna Hugh, RN Outcome: Adequate for Discharge 12/11/2022 0918 by Shauna Hugh, RN Outcome: Progressing   Problem: Respiratory: Goal: Ability to maintain a clear airway will improve 12/11/2022 1305 by Shauna Hugh, RN Outcome: Adequate for Discharge 12/11/2022 0918 by Shauna Hugh, RN Outcome: Progressing Goal: Levels of oxygenation will improve 12/11/2022 1305 by Shauna Hugh, RN Outcome: Adequate for Discharge 12/11/2022 [phone] by Shauna Hugh, RN Outcome: Progressing Goal: Ability to maintain adequate ventilation will improve 12/11/2022 1305 by Shauna Hugh, RN Outcome: Adequate for Discharge 12/11/2022 0918 by Shauna Hugh, RN Outcome: Progressing   Problem: Education: Goal: Understanding of CV disease, CV risk reduction, and recovery process will improve 12/11/2022 1305 by Shauna Hugh, RN Outcome: Adequate for Discharge 12/11/2022 0918 by Shauna Hugh, RN Outcome: Progressing Goal: Individualized Educational Video(s) 12/11/2022 1305 by Shauna Hugh, RN Outcome: Adequate for Discharge 12/11/2022 0918 by  Shauna Hugh, RN Outcome: Progressing   Problem: Activity: Goal: Ability to return to baseline activity level will improve 12/11/2022 1305 by Shauna Hugh, RN Outcome: Adequate for Discharge 12/11/2022 [phone] by Shauna Hugh, RN Outcome: Progressing   Problem: Cardiovascular: Goal: Ability to achieve and maintain adequate cardiovascular perfusion will improve 12/11/2022 1305 by Shauna Hugh, RN Outcome: Adequate for Discharge 12/11/2022 0918 by Shauna Hugh, RN Outcome: Progressing Goal: Vascular access site(s) Level 0-1 will be maintained 12/11/2022 1305 by Shauna Hugh, RN Outcome: Adequate for Discharge 12/11/2022 0918 by Shauna Hugh, RN Outcome: Progressing   Problem: Health Behavior/Discharge Planning: Goal: Ability to safely manage health-related needs after discharge will improve 12/11/2022 1305 by Shauna Hugh, RN Outcome: Adequate for Discharge 12/11/2022 0918 by Shauna Hugh, RN Outcome: Progressing   Problem: Education: Goal: Knowledge of General Education information will improve Description: Including pain rating scale, medication(s)/side effects and non-pharmacologic comfort measures 12/11/2022 1305 by Shauna Hugh, RN Outcome: Adequate for Discharge 12/11/2022 0918 by Shauna Hugh, RN Outcome: Progressing   Problem: Health Behavior/Discharge Planning: Goal: Ability to manage health-related needs will improve 12/11/2022 1305 by Shauna Hugh, RN Outcome: Adequate for Discharge 12/11/2022 0918 by Shauna Hugh, RN Outcome: Progressing   Problem: Clinical Measurements: Goal: Ability to maintain clinical measurements within normal limits will improve 12/11/2022 1305 by Shauna Hugh, RN Outcome: Adequate for Discharge 12/11/2022 0918 by Shauna Hugh, RN Outcome: Progressing Goal: Will remain free from infection 12/11/2022 1305 by Shauna Hugh, RN Outcome: Adequate for Discharge 12/11/2022 0918 by Shauna Hugh, RN Outcome: Progressing Goal: Diagnostic  test results will improve 12/11/2022 1305 by Shauna Hugh, RN Outcome: Adequate for Discharge 12/11/2022 0918 by Shauna Hugh, RN Outcome: Progressing Goal: Respiratory complications will improve 12/11/2022 1305 by Shauna Hugh, RN Outcome: Adequate for Discharge 12/11/2022 0918 by Shauna Hugh, RN Outcome: Progressing Goal: Cardiovascular complication will be avoided 12/11/2022 1305 by Shauna Hugh, RN Outcome: Adequate for Discharge 12/11/2022 [phone]  by Shauna Hugh, RN Outcome: Progressing   Problem: Activity: Goal: Risk for activity intolerance will decrease 12/11/2022 1305 by Shauna Hugh, RN Outcome: Adequate for Discharge 12/11/2022 [phone] by Shauna Hugh, RN Outcome: Progressing   Problem: Nutrition: Goal: Adequate nutrition will be maintained 12/11/2022 1305 by Shauna Hugh, RN Outcome: Adequate for Discharge 12/11/2022 0918 by Shauna Hugh, RN Outcome: Progressing   Problem: Coping: Goal: Level of anxiety will decrease 12/11/2022 1305 by Shauna Hugh, RN Outcome: Adequate for Discharge 12/11/2022 0918 by Shauna Hugh, RN Outcome: Progressing   Problem: Elimination: Goal: Will not experience complications related to bowel motility 12/11/2022 1305 by Shauna Hugh, RN Outcome: Adequate for Discharge 12/11/2022 0918 by Shauna Hugh, RN Outcome: Progressing Goal: Will not experience complications related to urinary retention 12/11/2022 1305 by Shauna Hugh, RN Outcome: Adequate for Discharge 12/11/2022 0918 by Shauna Hugh, RN Outcome: Progressing   Problem: Pain Managment: Goal: General experience of comfort will improve 12/11/2022 1305 by Shauna Hugh, RN Outcome: Adequate for Discharge 12/11/2022 0918 by Shauna Hugh, RN Outcome: Progressing   Problem: Safety: Goal: Ability to remain free from injury will improve 12/11/2022 1305 by Shauna Hugh, RN Outcome: Adequate for Discharge 12/11/2022 0918 by Shauna Hugh, RN Outcome: Progressing   Problem:  Skin Integrity: Goal: Risk for impaired skin integrity will decrease 12/11/2022 1305 by Shauna Hugh, RN Outcome: Adequate for Discharge 12/11/2022 0918 by Shauna Hugh, RN Outcome: Progressing

## 2022-12-11 NOTE — TOC Transition Note (Addendum)
Transition of Care [facility]) - CM/SW Discharge Note   Patient Details  Name: Ashley Brooks MRN: [id number] Date of Birth: 17-Jan-1946  [facility] CM/SW Contact:  Tiburcio Bash, LCSW Phone Number: 12/11/2022, 9:40 AM   Clinical Narrative:      Patient to dc back to [facility], [place] spoke with [facility] liaison with [facility] who requests dc summary be faxed to [phone].   [name] aware of PT OT RN needs, reports [name] uses [place]. CSW has reached out to [name] to inform need of PT OT and RN for lab checks within a week due to sodium fluctuations.   [name] does not provide transport,  will arrange [facility].   Final next level of care: Home w Home Health Services Barriers to Discharge: No Barriers Identified   Patient Goals and CMS Choice CMS Medicare.gov Compare Post Acute Care list provided to:: Patient Choice offered to / list presented to : Patient  Discharge Placement                      Patient and family notified of of transfer: 12/11/22  Discharge Plan and Services Additional resources added to the After Visit Summary for                            [name] Arranged: PT, OT, RN [name] Agency: [name] Date [place]: 12/11/22 Time HH Agency Contacted: 0930    Social Determinants of Health (SDOH) Interventions SDOH Screenings   Food Insecurity: No Food Insecurity (12/03/2022)  Housing: Low Risk  (12/03/2022)  Transportation Needs: No Transportation Needs (12/03/2022)  Utilities: Not At Risk (12/03/2022)  Tobacco Use: Medium Risk (12/04/2022)     Readmission Risk Interventions     No data to display

## 2022-12-11 NOTE — Plan of Care (Signed)
  Problem: Education: Goal: Knowledge of disease or condition will improve Outcome: Progressing Goal: Knowledge of the prescribed therapeutic regimen will improve Outcome: Progressing Goal: Individualized Educational Video(s) Outcome: Progressing   Problem: Activity: Goal: Ability to tolerate increased activity will improve Outcome: Progressing Goal: Will verbalize the importance of balancing activity with adequate rest periods Outcome: Progressing   Problem: Respiratory: Goal: Ability to maintain a clear airway will improve Outcome: Progressing Goal: Levels of oxygenation will improve Outcome: Progressing Goal: Ability to maintain adequate ventilation will improve Outcome: Progressing   Problem: Education: Goal: Understanding of CV disease, CV risk reduction, and recovery process will improve Outcome: Progressing Goal: Individualized Educational Video(s) Outcome: Progressing   Problem: Activity: Goal: Ability to return to baseline activity level will improve Outcome: Progressing   Problem: Cardiovascular: Goal: Ability to achieve and maintain adequate cardiovascular perfusion will improve Outcome: Progressing Goal: Vascular access site(s) Level 0-1 will be maintained Outcome: Progressing   Problem: Health Behavior/Discharge Planning: Goal: Ability to safely manage health-related needs after discharge will improve Outcome: Progressing   Problem: Education: Goal: Knowledge of General Education information will improve Description: Including pain rating scale, medication(s)/side effects and non-pharmacologic comfort measures Outcome: Progressing   Problem: Health Behavior/Discharge Planning: Goal: Ability to manage health-related needs will improve Outcome: Progressing   Problem: Clinical Measurements: Goal: Ability to maintain clinical measurements within normal limits will improve Outcome: Progressing Goal: Will remain free from infection Outcome:  Progressing Goal: Diagnostic test results will improve Outcome: Progressing Goal: Respiratory complications will improve Outcome: Progressing Goal: Cardiovascular complication will be avoided Outcome: Progressing   Problem: Activity: Goal: Risk for activity intolerance will decrease Outcome: Progressing   Problem: Nutrition: Goal: Adequate nutrition will be maintained Outcome: Progressing   Problem: Coping: Goal: Level of anxiety will decrease Outcome: Progressing   Problem: Elimination: Goal: Will not experience complications related to bowel motility Outcome: Progressing Goal: Will not experience complications related to urinary retention Outcome: Progressing   Problem: Pain Managment: Goal: General experience of comfort will improve Outcome: Progressing   Problem: Safety: Goal: Ability to remain free from injury will improve Outcome: Progressing   Problem: Skin Integrity: Goal: Risk for impaired skin integrity will decrease Outcome: Progressing   

## 2022-12-11 NOTE — Discharge Summary (Signed)
Physician Discharge Summary   Patient: Ashley Brooks MRN: [id number] DOB: 1946-02-16  Admit date:     12/02/2022  Discharge date: 12/11/22  Discharge Physician: Ezekiel Slocumb   PCP: Juluis Pitch, MD   Recommendations at discharge:   Follow up with Cardiology as scheduled Follow up with Primary Care in 1-2 weeks Repeat BMP within 1 week.  Adjust diuretic as needed Follow up and monitor orthostatic hypotension with cardiac medications.  Consider midodrine if needed.   Discharge Diagnoses: Principal Problem:   Acute systolic CHF (congestive heart failure) (HCC) Active Problems:   Hyponatremia   HLD (hyperlipidemia)   Depression with anxiety   Pressure injury of skin   Orthostatic hypotension  Resolved Problems:   COPD exacerbation (HCC)   Acute hypoxic respiratory failure (HCC)   Hypokalemia   Elevated troponin  Hospital Course: Ashley Brooks is a 77 y.o. female with medical history significant for COPD, hyperlipidemia, anxiety, tobacco use disorder, who presented to the hospital with sudden onset of shortness of breath.  Reportedly, when EMS found her, they were empty red wine bottles around her.  She was given IV Solu-Medrol and DuoNeb and was brought to the emergency department for further management.  She is not on home oxygen.     She had elevated troponin and troponin went from 133 to 1,393. She was admitted to the hospital for acute NSTEMI and COPD exacerbation.    She was treated with IV heparin drip, steroids and bronchodilators.  She underwent left heart catheterization with Dr. Humphrey Rolls which did not show any evidence of CAD, but she was found to have severe LV dysfunction with EF less than 20% consistent with acute systolic CHF.    She was diuresed on Lasix drip for a few days, stopped this AM (2/7) for signs of hypovolemia on exam and labs.  Hospital course complicated by hypotension on multiple new cardiac medications.  She is symptomatic at rest when  seated with dizziness and lightheadedness.    Assessment and Plan: * Acute systolic CHF (congestive heart failure) (HCC) Left heart cath did not show evidence of CAD.  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  Dizziness improved. 2/13: still +orthostatic BP but recovers within 3 minutes and minimal symptoms --Dr. Humphrey Rolls cardiology - follow up outpatient as scheduled --Continue lowest dose Toprol-XL, Aldactone, losartan --Stopped Entresto (2/7), BP does not tolerate --She was diuresed with Lasix 20 mg IV BID and hyponatremia resolved --Discharge PO Lasix 20 mg BID --Repeat BMP within 1 week, follow sodium level and adjust diuretics accordingly --Daily weights --Low sodium diet, fluid restriction --Home health RN to follow  Hyponatremia Sodium trended down on Lasix drip and further since it was stopped, initially appeared due to hypovolemia from diuresis and poor PO intake.  2/9 - fluid challenge with 500 cc, Na declined to 127. Started back on IV Lasix 20 mg BID 2/10 - sodium improving with diuresis 2/11 - sodium normalized 135 --Continue Lasix as outlined for CHF  Acute hypoxic respiratory failure (HCC)-resolved as of 12/11/2022 Resolved.  O2 sats stable on room air. Due to combination of COPD and CHF. Oxygen dropped into the low 80s while she was working with PT.  Wean oxygen as tolerated (only use O2 if sats < 90% on  room air).  If sats dropping, need to do home O2 qualification documentation.  COPD exacerbation (HCC)-resolved as of 12/11/2022 Completed prednisone, doxycycline Continue PRN bronchoilators Resume home regimen at discharge Weaned off oxygen with stable O2 sats.  Orthostatic hypotension Pt has  c/o dizziness and BP's have been soft despite de-escalating cardiac medications. Suspect she was over-diuresed and has ongoing poor PO intake causing hypovolemia.  Sodium also is low. --Daily orthostatic vitals --Reduced Toprol dose 25 >> 12.5 mg --Already on lowest doses losartan, spiro - hope to keep for GDMT with her low EF --May need to start midodrine at follow up if this is persistent problem. --CHECK ORTHOSTATIC VITALS AT FOLLOW UP  Pressure injury of skin I agree with the wound description as outlined.  Continue wound care & offloading pressure areas. Pressure Injury 12/03/22 Toe (Comment  which one) Anterior;Right Stage 2 -  Partial thickness loss of dermis presenting as a shallow open injury with a red, pink wound bed without slough. This is a full thickness wound, NOT a pressure injury (Active)  12/03/22 1200  Location: Toe (Comment  which one)  Location Orientation: Anterior;Right  Staging: Stage 2 -  Partial thickness loss of dermis presenting as a shallow open injury with a red, pink wound bed without slough.  Wound Description (Comments): This is a full thickness wound, NOT a pressure injury  Present on Admission: Yes      Depression with anxiety Continue home meds  HLD (hyperlipidemia) On statin  Elevated troponin-resolved as of 12/11/2022 Attributed to type II NSTEMI (demand ischemia).  Cardiac cath no significant CAD that required any intervention. Mgmt as outlined (see CHF).  Hypokalemia-resolved as of 12/11/2022 Due to diuresis. Resolved with replacement. Monitor BMP         Consultants: Cardiology Procedures performed: Echo  Disposition:  Independent Living  Diet recommendation:  Discharge Diet Orders (From admission, onward)     Start     Ordered   12/11/22 0000  Diet - low sodium heart healthy       Fluid restriction 1500 cc /day 12/11/22 1223              DISCHARGE MEDICATION: Allergies as of 12/11/2022   No Known Allergies       Medication List     STOP taking these medications    aspirin 325 MG tablet Replaced by: aspirin 81 MG chewable tablet   simvastatin 40 MG tablet Commonly known as: ZOCOR       TAKE these medications    acetaminophen 325 MG tablet Commonly known as: TYLENOL Take 2 tablets (650 mg total) by mouth every 4 (four) hours as needed for headache or mild pain.   ALPRAZolam 0.25 MG tablet Commonly known as: XANAX Take 1 tablet (0.25 mg total) by mouth 3 (three) times daily as needed for anxiety.   aspirin 81 MG chewable tablet Chew 1 tablet (81 mg total) by mouth daily. Start taking on: December 12, 2022 Replaces: aspirin 325 MG tablet   atorvastatin 40 MG tablet Commonly known as: LIPITOR Take 1 tablet (40 mg total) by mouth daily. Start taking on: December 12, 2022   digoxin 0.125 MG tablet Commonly known as: LANOXIN Take 1 tablet (0.125 mg total) by mouth daily. Start taking on: December 12, 2022   feeding supplement Liqd Take 237 mLs by mouth 2 (two) times daily between meals.   FLUoxetine 40 MG capsule Commonly known as: PROZAC Take 40 mg by mouth daily.   furosemide 20  MG tablet Commonly known as: Lasix Take 1 tablet (20 mg total) by mouth 2 (two) times daily. HOLD if dizzy or lightheaded or if BP less than 100/60   ipratropium-albuterol 0.5-2.5 (3) MG/3ML Soln Commonly known as: DUONEB Take 3 mLs by nebulization every 4 (four) hours as needed. What changed: when to take this   losartan 25 MG tablet Commonly known as: COZAAR Take 0.5 tablets (12.5 mg total) by mouth daily. Start taking on: December 12, 2022   melatonin 3 MG Tabs tablet Take 3 mg by mouth at bedtime.   metoprolol succinate 25 MG 24 hr tablet Commonly known as: TOPROL-XL Take 0.5 tablets (12.5 mg total) by mouth daily. Start taking on: December 12, 2022   mupirocin ointment 2 % Commonly known as: BACTROBAN Apply 1 Application topically daily. Start taking on: December 12, 2022    senna-docusate 8.6-50 MG tablet Commonly known as: Senokot-S Take 1 tablet by mouth 2 (two) times daily as needed for mild constipation.   spironolactone 25 MG tablet Commonly known as: ALDACTONE Take 0.5 tablets (12.5 mg total) by mouth daily. Start taking on: December 12, 2022   Stiolto Respimat 2.5-2.5 MCG/ACT Aers Generic drug: Tiotropium Bromide-Olodaterol Inhale 2 puffs into the lungs daily.   Ventolin HFA 108 (90 Base) MCG/ACT inhaler Generic drug: albuterol Inhale 2 puffs into the lungs every 4 (four) hours as needed for wheezing.               Discharge Care Instructions  (From admission, onward)           Start     Ordered   12/11/22 0000  Discharge wound care:       Comments: Apply Bactroban to right 2nd toe every day, then cover with foam dressing.   Change foam dressing every 3 day or as needed if soiled.   12/11/22 1223            Discharge Exam: Filed Weights   12/10/22 0834 12/11/22 0426 12/11/22 0824  Weight: 42.2 kg 43 kg 42.7 kg   General exam: awake, alert, no acute distress, frail HEENT: clear conjunctiva, anicteric sclera, moist mucus membranes, hard of hearing  Respiratory system: CTAB, no wheezes, rales or rhonchi, normal respiratory effort. Cardiovascular system: normal S1/S2, RRR, no pedal edema.   Gastrointestinal system: soft, NT, ND, no HSM felt, +bowel sounds. Central nervous system: A&O x4. no gross focal neurologic deficits, normal speech Extremities: moves all, no edema, normal tone Skin: dry, intact, normal temperature, normal color, No rashes, lesions or ulcers Psychiatry: normal mood, congruent affect, judgement and insight appear normal   Condition at discharge: stable  The results of significant diagnostics from this hospitalization (including imaging, microbiology, ancillary and laboratory) are listed below for reference.   Imaging Studies: US ARTERIAL ABI (SCREENING LOWER EXTREMITY)  Result Date:  12/06/2022 CLINICAL DATA:  Peripheral arterial disease, cold feet EXAM: NONINVASIVE PHYSIOLOGIC VASCULAR STUDY OF BILATERAL LOWER EXTREMITIES TECHNIQUE: Evaluation of both lower extremities were performed at rest, including calculation of ankle-brachial indices with single level Doppler, pressure and pulse volume recording. COMPARISON:  None Available. FINDINGS: Right ABI:  1.0 Left ABI:  1.0 Right Lower Extremity:  Normal arterial waveforms at the ankle. Left Lower Extremity:  Normal arterial waveforms at the ankle. 1.0-1.4 Normal IMPRESSION: Normal bilateral resting ankle-brachial indices. Electronically Signed   By: Jacqulynn Cadet M.D.   On: 12/06/2022 12:56   ECHOCARDIOGRAM COMPLETE  Result Date: 12/03/2022    ECHOCARDIOGRAM REPORT   Patient Name:  Miroslava Leonie Green Date of Exam: 12/03/2022 Medical Rec #:  [id number]         Height:       60.0 in Accession #:    [id number]        Weight:       101.0 lb Date of Birth:  1946-04-27         BSA:          1.396 m Patient Age:    77 years          BP:           148/65 mmHg Patient Gender: F                 HR:           97 bpm. Exam Location:  [facility] Procedure: 2D Echo, Cardiac Doppler, Color Doppler and Intracardiac            Opacification Agent Indications:     Dyspnea  History:         Patient has no prior history of Echocardiogram examinations.                  COPD, Signs/Symptoms:Dyspnea; Risk Factors:Dyslipidemia and                  Former Smoker.  Sonographer:     Wenda Low Referring Phys:  [id number] SARA-MAIZ A THOMAS Diagnosing Phys: Kathlyn Sacramento MD IMPRESSIONS  1. Left ventricular ejection fraction, by estimation, is 20 to 25%. The left ventricle has severely decreased function. The left ventricle demonstrates regional wall motion abnormalities (see scoring diagram/findings for description). Left ventricular diastolic parameters are indeterminate. There is severe hypokinesis of the left ventricular, mid-apical anterior wall, inferior wall, lateral  wall, septal wall and apical segment. Wall motion abnormalities are highly suggestive of stress induced cardiomyopathy.  2. Right ventricular systolic function is normal. The right ventricular size is normal. There is normal pulmonary artery systolic pressure.  3. The mitral valve is normal in structure. No evidence of mitral valve regurgitation. No evidence of mitral stenosis.  4. The aortic valve is normal in structure. Aortic valve regurgitation is not visualized. Aortic valve sclerosis/calcification is present, without any evidence of aortic stenosis.  5. The inferior vena cava is normal in size with greater than 50% respiratory variability, suggesting right atrial pressure of 3 mmHg. FINDINGS  Left Ventricle: Left ventricular ejection fraction, by estimation, is 20 to 25%. The left ventricle has severely decreased function. The left ventricle demonstrates regional wall motion abnormalities. Severe hypokinesis of the left ventricular, mid-apical anterior wall, inferior wall, lateral wall, septal wall and apical segment. Definity contrast agent was given IV to delineate the left ventricular endocardial borders. The left ventricular internal cavity size was normal in size. There is no left ventricular hypertrophy. Left ventricular diastolic parameters are indeterminate. Right Ventricle: The right ventricular size is normal. No increase in right ventricular wall thickness. Right ventricular systolic function is normal. There is normal pulmonary artery systolic pressure. The tricuspid regurgitant velocity is 2.51 m/s, and  with an assumed right atrial pressure of 3 mmHg, the estimated right ventricular systolic pressure is [id number] mmHg. Left Atrium: Left atrial size was normal in size. Right Atrium: Right atrial size was normal in size. Pericardium: There is no evidence of pericardial effusion. Mitral Valve: The mitral valve is normal in structure. No evidence of mitral valve regurgitation. No evidence of mitral valve  stenosis. MV peak gradient, 5.3 mmHg. The mean  mitral valve gradient is 2.0 mmHg. Tricuspid Valve: The tricuspid valve is normal in structure. Tricuspid valve regurgitation is mild . No evidence of tricuspid stenosis. Aortic Valve: The aortic valve is normal in structure. Aortic valve regurgitation is not visualized. Aortic valve sclerosis/calcification is present, without any evidence of aortic stenosis. Aortic valve mean gradient measures 3.0 mmHg. Aortic valve peak  gradient measures 6.2 mmHg. Aortic valve area, by VTI measures 2.31 cm. Pulmonic Valve: The pulmonic valve was normal in structure. Pulmonic valve regurgitation is not visualized. No evidence of pulmonic stenosis. Aorta: The aortic root is normal in size and structure. Venous: The inferior vena cava was not well visualized. The inferior vena cava is normal in size with greater than 50% respiratory variability, suggesting right atrial pressure of 3 mmHg. IAS/Shunts: No atrial level shunt detected by color flow Doppler.  LEFT VENTRICLE PLAX 2D LVIDd:         4.00 cm   Diastology LVIDs:         3.10 cm   LV e' medial:    12.30 cm/s LV PW:         1.00 cm   LV E/e' medial:  8.1 LV IVS:        0.90 cm   LV e' lateral:   4.57 cm/s LVOT diam:     2.00 cm   LV E/e' lateral: 21.8 LV SV:         52 LV SV Index:   37 LVOT Area:     3.14 cm  RIGHT VENTRICLE RV Basal diam:  3.00 cm RV Mid diam:    1.90 cm RV S prime:     24.60 cm/s TAPSE (M-mode): 1.9 cm LEFT ATRIUM             Index        RIGHT ATRIUM          Index LA diam:        3.20 cm 2.29 cm/m   RA Area:     7.89 cm LA Vol (A2C):   23.4 ml 16.76 ml/m  RA Volume:   15.10 ml 10.82 ml/m LA Vol (A4C):   16.9 ml 12.11 ml/m LA Biplane Vol: 21.4 ml 15.33 ml/m  AORTIC VALVE                    PULMONIC VALVE AV Area (Vmax):    2.19 cm     PV Vmax:       1.19 m/s AV Area (Vmean):   2.04 cm     PV Peak grad:  5.7 mmHg AV Area (VTI):     2.31 cm AV Vmax:           125.00 cm/s AV Vmean:          81.600 cm/s  AV VTI:            0.223 m AV Peak Grad:      6.2 mmHg AV Mean Grad:      3.0 mmHg LVOT Vmax:         87.00 cm/s LVOT Vmean:        53.100 cm/s LVOT VTI:          0.164 m LVOT/AV VTI ratio: 0.74  AORTA Ao Root diam: 3.40 cm MITRAL VALVE               TRICUSPID VALVE MV Area (PHT): 7.82 cm    TR Peak grad:   25.2 mmHg MV  Area VTI:   3.07 cm    TR Vmax:        251.00 cm/s MV Peak grad:  5.3 mmHg MV Mean grad:  2.0 mmHg    SHUNTS MV Vmax:       1.15 m/s    Systemic VTI:  0.16 m MV Vmean:      60.4 cm/s   Systemic Diam: 2.00 cm MV Decel Time: 97 msec MV E velocity: 99.60 cm/s Kathlyn Sacramento MD Electronically signed by Kathlyn Sacramento MD Signature Date/Time: 12/03/2022/2:39:31 PM    Final    CARDIAC CATHETERIZATION  Result Date: 12/03/2022   Mid LAD lesion is 30% stenosed.   There is severe left ventricular systolic dysfunction.   LV end diastolic pressure is severely elevated.   The left ventricular ejection fraction is less than 25% by visual estimate. Minor luminor irregularities, with severe LV systolic dysfunction. Advise  metoprolol/digoxin/lasix drip/aldactone and entresto tomorrow   CT Angio Chest PE W and/or Wo Contrast  Result Date: 12/02/2022 CLINICAL DATA:  Pulmonary embolism (PE) suspected, high prob. Respiratory distress, shortness of breath EXAM: CT ANGIOGRAPHY CHEST WITH CONTRAST TECHNIQUE: Multidetector CT imaging of the chest was performed using the standard protocol during bolus administration of intravenous contrast. Multiplanar CT image reconstructions and MIPs were obtained to evaluate the vascular anatomy. RADIATION DOSE REDUCTION: This exam was performed according to the departmental dose-optimization program which includes automated exposure control, adjustment of the mA and/or kV according to patient size and/or use of iterative reconstruction technique. CONTRAST:  33m OMNIPAQUE IOHEXOL 350 MG/ML SOLN COMPARISON:  02/02/2022 FINDINGS: Cardiovascular: No filling defects in the pulmonary  arteries to suggest pulmonary emboli. Heart is normal size. Aorta is normal caliber. Aortic atherosclerosis and coronary artery calcifications. Mediastinum/Nodes: No mediastinal, hilar, or axillary adenopathy. Trachea and esophagus are unremarkable. Thyroid unremarkable. Lungs/Pleura: Centrilobular emphysema. No confluent opacities or effusions. Upper Abdomen: No acute findings Musculoskeletal: Chest wall soft tissues are unremarkable. No acute bony abnormality. Review of the MIP images confirms the above findings. IMPRESSION: No evidence of pulmonary embolus. Coronary artery disease. No acute cardiopulmonary disease. Aortic Atherosclerosis (ICD10-I70.0) and Emphysema (ICD10-J43.9). Electronically Signed   By: KRolm BaptiseM.D.   On: 12/02/2022 23:02   DG Chest Port 1 View  Result Date: 12/02/2022 CLINICAL DATA:  Shortness of breath EXAM: PORTABLE CHEST 1 VIEW COMPARISON:  11/18/2020 FINDINGS: Mild hyperinflation/COPD. Heart and mediastinal contours are within normal limits. No focal opacities or effusions. No acute bony abnormality. Aortic atherosclerosis. IMPRESSION: COPD.  No active disease. Electronically Signed   By: KRolm BaptiseM.D.   On: 12/02/2022 22:10    Microbiology: Results for orders placed or performed during the hospital encounter of 12/02/22  MRSA Next Gen by PCR, Nasal     Status: Abnormal   Collection Time: 12/07/22  8:52 AM  Result Value Ref Range Status   MRSA by PCR Next Gen DETECTED (A) NOT DETECTED Final    Comment: RESULT CALLED TO, READ BACK BY AND VERIFIED WITH: NATHAN SHIRLEY AT 1146 12/07/22.PMF (NOTE) The GeneXpert MRSA Assay (FDA approved for NASAL specimens only), is one component of a comprehensive MRSA colonization surveillance program. It is not intended to diagnose MRSA infection nor to guide or monitor treatment for MRSA infections. Test performance is not FDA approved in patients less than 211years old. Performed at [facility] [address]  [place] [place] [id number]    Labs: CBC: No results for input(s): "WBC", "NEUTROABS", "HGB", "HCT", "MCV", "PLT" in  the last 168 hours. Basic Metabolic Panel: Recent Labs  Lab 12/05/22 0449 12/06/22 0315 12/07/22 1518 12/08/22 0259 12/09/22 0948 12/10/22 0431 12/11/22 0427  NA 131*   < > 127* 132* 135 136 132*  K 3.7   < > 3.5 3.1* 4.1 3.8 3.6  CL 91*   < > 88* 98 101 105 101  CO2 30   < > 26 26 26 24 25  $ GLUCOSE 122*   < > 161* 126* 117* 109* 118*  BUN 36*   < > 42* 41* 35* 30* 17  CREATININE 0.98   < > 0.92 0.72 0.84 0.60 0.59  CALCIUM 8.8*   < > 9.0 8.8* 8.9 8.4* 8.3*  MG 2.4  --   --   --  2.4  --   --    < > = values in this interval not displayed.   Liver Function Tests: No results for input(s): "AST", "ALT", "ALKPHOS", "BILITOT", "PROT", "ALBUMIN" in the last 168 hours. CBG: Recent Labs  Lab 12/10/22 1211 12/10/22 1519 12/10/22 2311 12/11/22 0724 12/11/22 1157  GLUCAP 146* 119* 150* 115* 109*    Discharge time spent: greater than 30 minutes.  Signed: Ezekiel Slocumb, DO [facility] 12/11/2022

## 2022-12-11 NOTE — Progress Notes (Signed)
   Heart Failure Nurse Navigator Note   Met with patient today, she was lying in bed in no acute distress.  She states that she is being discharged back to [facility].  By teach back method went over how she is going to take care of herself once she returns to her home.  She needed reinforcement on fluid restriction and daily weights.  She was also given a scale from TOC.  Explained the routine for daily weights and what to report.  So discussed her follow-up in the outpatient heart failure clinic, date was changed to accommodate her having transportation from [facility].  He is in agreement with the appointment date and time.  He had no further questions.  Pricilla Riffle RN [facility]

## 2022-12-19 ENCOUNTER — Encounter: Payer: Medicare PPO | Admitting: Family

## 2022-12-24 NOTE — Progress Notes (Unsigned)
   Patient ID: Ashley Brooks, female    DOB: Mar 18, 1946, 77 y.o.   MRN: [id number]  HPI  Ashley Brooks is a 77 y/o female with a history of  Echo 12/03/22 showed an EF of 20-25% along with severe hypokinesis of the left ventricular, mid-apical anterior wall, inferior wall, lateral wall, septal wall and apical segment. Wall motion abnormalities are highly suggestive of stress induced cardiomyopathy  LHC done 12/03/22 showed:   Mid LAD lesion is 30% stenosed.   There is severe left ventricular systolic dysfunction.   LV end diastolic pressure is severely elevated.   The left ventricular ejection fraction is less than 25% by visual estimate.  Admitted 12/02/22 due to SOB. EMS found several empty wine bottles around her. Troponin elevation to 1393 & cath was done. Diuresed. Developed hyponatremia which improved after diuresing.   She presents today for her initial HF Clinic visit with a chief complaint of   Review of Systems    Physical Exam    Assessment & Plan:  1: Chronic heart failure with reduced ejection fraction- - NYHA class - BNP 12/03/22 was 133.8  2: HTN- - BP - saw PCP (Ashley Brooks) 11/28/21 - BMP 12/11/22 showed sodium 132, potassium 3.8, creatinine 0.59 and GFR >60  3: COPD-  4: Anxiety-  5: Tobacco use-

## 2022-12-25 ENCOUNTER — Encounter: Payer: Self-pay | Admitting: Family

## 2022-12-25 ENCOUNTER — Other Ambulatory Visit
Admission: RE | Admit: 2022-12-25 | Discharge: 2022-12-25 | Disposition: A | Discharge status: Home / Self Care | Payer: Medicare PPO | Source: Ambulatory Visit | Attending: Family | Admitting: Family

## 2022-12-25 ENCOUNTER — Ambulatory Visit (HOSPITAL_BASED_OUTPATIENT_CLINIC_OR_DEPARTMENT_OTHER): Payer: Medicare PPO | Admitting: Family

## 2022-12-25 VITALS — BP 108/65 | HR 78 | Wt 97.4 lb

## 2022-12-25 DIAGNOSIS — Z79899 Other long term (current) drug therapy: Secondary | ICD-10-CM | POA: Insufficient documentation

## 2022-12-25 DIAGNOSIS — E785 Hyperlipidemia, unspecified: Secondary | ICD-10-CM | POA: Insufficient documentation

## 2022-12-25 DIAGNOSIS — H539 Unspecified visual disturbance: Secondary | ICD-10-CM | POA: Insufficient documentation

## 2022-12-25 DIAGNOSIS — I10 Essential (primary) hypertension: Secondary | ICD-10-CM

## 2022-12-25 DIAGNOSIS — I5022 Chronic systolic (congestive) heart failure: Secondary | ICD-10-CM | POA: Insufficient documentation

## 2022-12-25 DIAGNOSIS — F419 Anxiety disorder, unspecified: Secondary | ICD-10-CM

## 2022-12-25 DIAGNOSIS — I110 Hypertensive heart disease with heart failure: Secondary | ICD-10-CM | POA: Insufficient documentation

## 2022-12-25 DIAGNOSIS — Z87891 Personal history of nicotine dependence: Secondary | ICD-10-CM | POA: Insufficient documentation

## 2022-12-25 DIAGNOSIS — J449 Chronic obstructive pulmonary disease, unspecified: Secondary | ICD-10-CM

## 2022-12-25 LAB — BASIC METABOLIC PANEL
Anion gap: 10 (ref 5–15)
BUN: 12 mg/dL (ref 8–23)
CO2: 28 mmol/L (ref 22–32)
Calcium: 8.9 mg/dL (ref 8.9–10.3)
Chloride: 100 mmol/L (ref 98–111)
Creatinine, Ser: 0.65 mg/dL (ref 0.44–1.00)
GFR, Estimated: >60 mL/min (ref >60)
Glucose, Bld: 96 mg/dL (ref 70–99)
Potassium: 3.7 mmol/L (ref 3.5–5.1)
Sodium: 138 mmol/L (ref 135–145)

## 2023-01-11 ENCOUNTER — Other Ambulatory Visit: Payer: Self-pay | Admitting: Acute Care

## 2023-01-11 DIAGNOSIS — Z87891 Personal history of nicotine dependence: Secondary | ICD-10-CM

## 2023-01-11 DIAGNOSIS — Z122 Encounter for screening for malignant neoplasm of respiratory organs: Secondary | ICD-10-CM

## 2023-01-23 ENCOUNTER — Encounter: Payer: Medicare PPO | Admitting: Family

## 2023-01-25 ENCOUNTER — Telehealth: Payer: Self-pay

## 2023-01-25 NOTE — Telephone Encounter (Signed)
Contacted patient by phone inquiring about billed amount for uncovered expense from LDCT.  Explained I was asked to call after she spoke to [place] from yesterday when scheduling her annual LDCT.  Patient states she has a $300 bill from a 'cancelled appt' for LDCT that was dated 02/05/23 for the appt.  She was also told the CT was ordered by her PCP provider.  Could not find an appt related to PCP.  Pt did have a CTA in Feb but she said it is not regarding that appt.  There was an appt for LDCT on 02/05/23 that was cancelled but that date has not even arrived as of this note so should not be related to that appt.  Patient is going to call insurance today to get a better explanation.  She is not understanding the feel of $300.  She has already spoken to [facility] billing who she understood them to say was a cancelled appt fee.

## 2023-02-03 IMAGING — MG MM DIGITAL SCREENING BILAT W/ TOMO AND CAD
6 of 10 series · 6 of 30 positions shown · non-contrast
Comparison: Previous exam(s).

CLINICAL DATA: Screening.

EXAM:
DIGITAL SCREENING BILATERAL MAMMOGRAM WITH TOMOSYNTHESIS AND CAD
TECHNIQUE: Bilateral screening digital craniocaudal and mediolateral oblique
mammograms were obtained. Bilateral screening digital breast
tomosynthesis was performed. The images were evaluated with
computer-aided detection.

[L MLO synth-2D (1 of 2)]
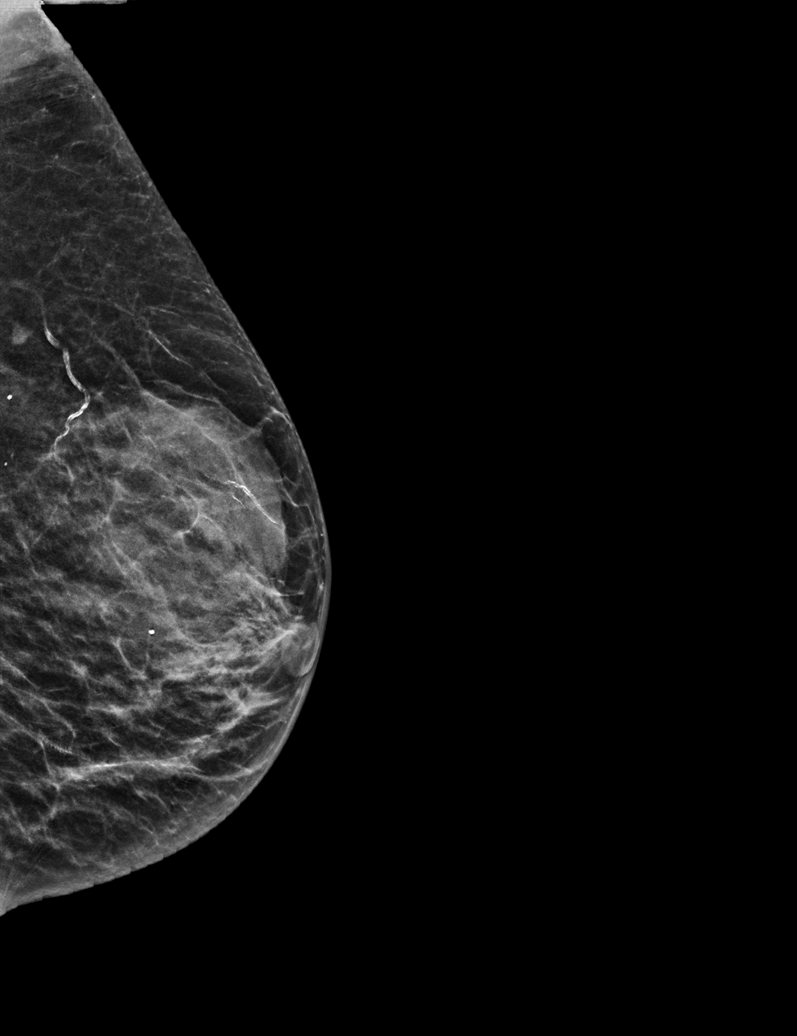

[L CC synth-2D]
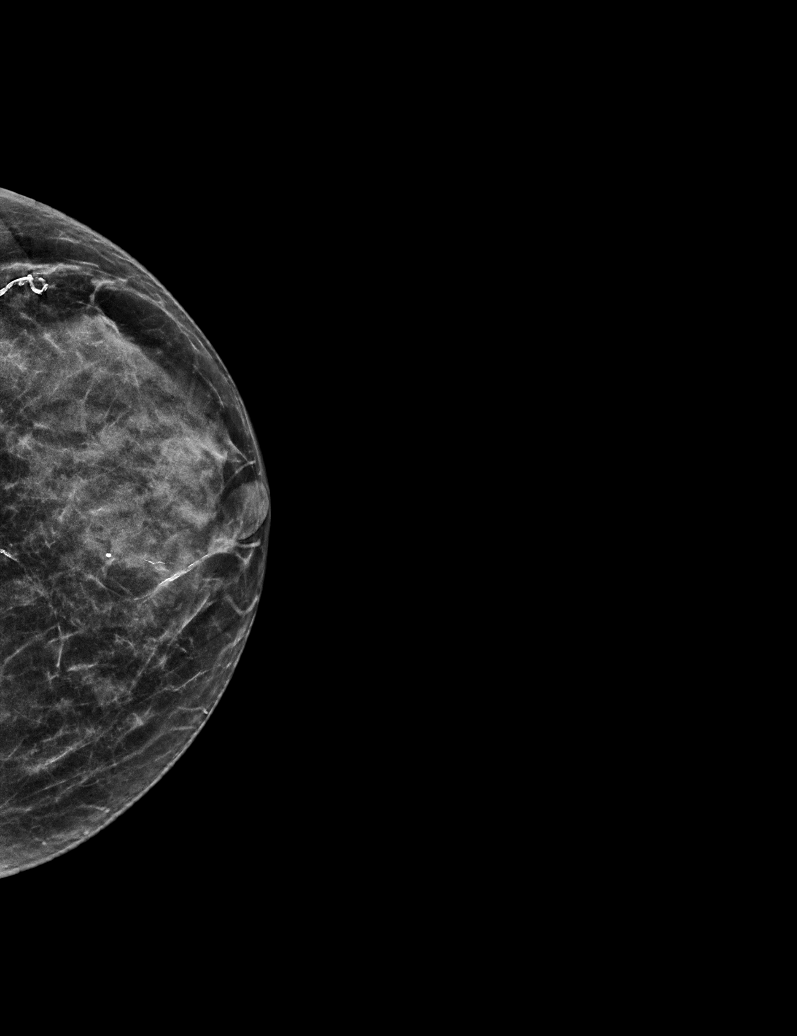

[R MLO synth-2D]
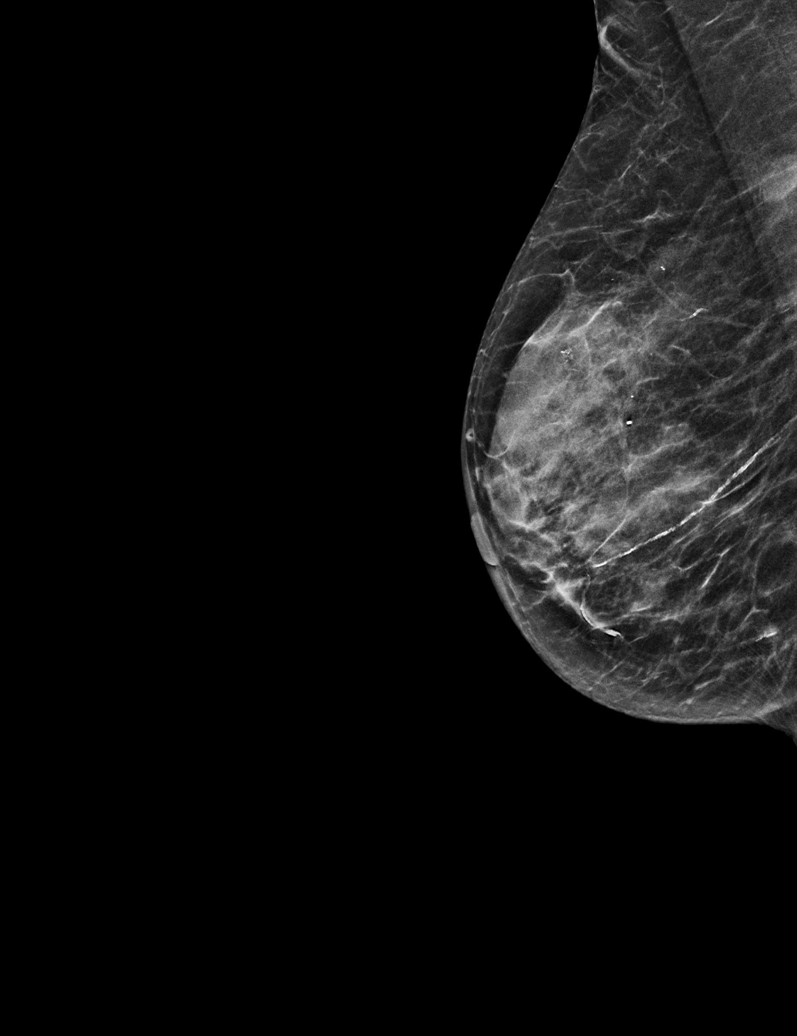

[R CC synth-2D]
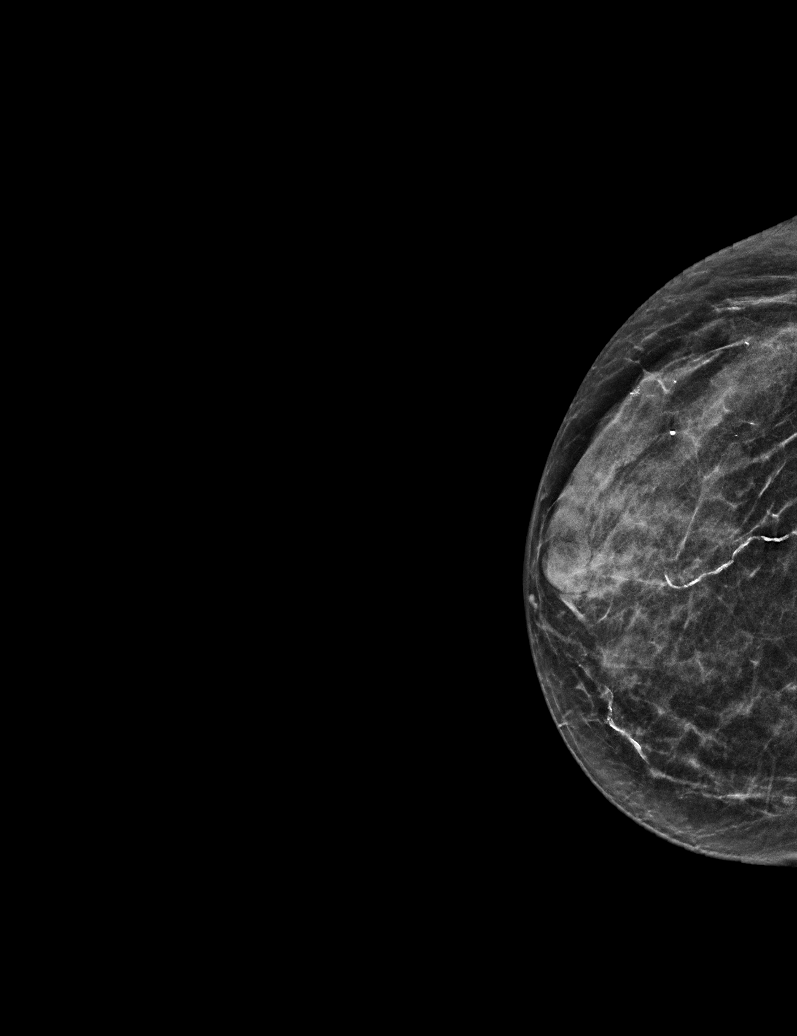

[L MLO synth-2D (2 of 2)]
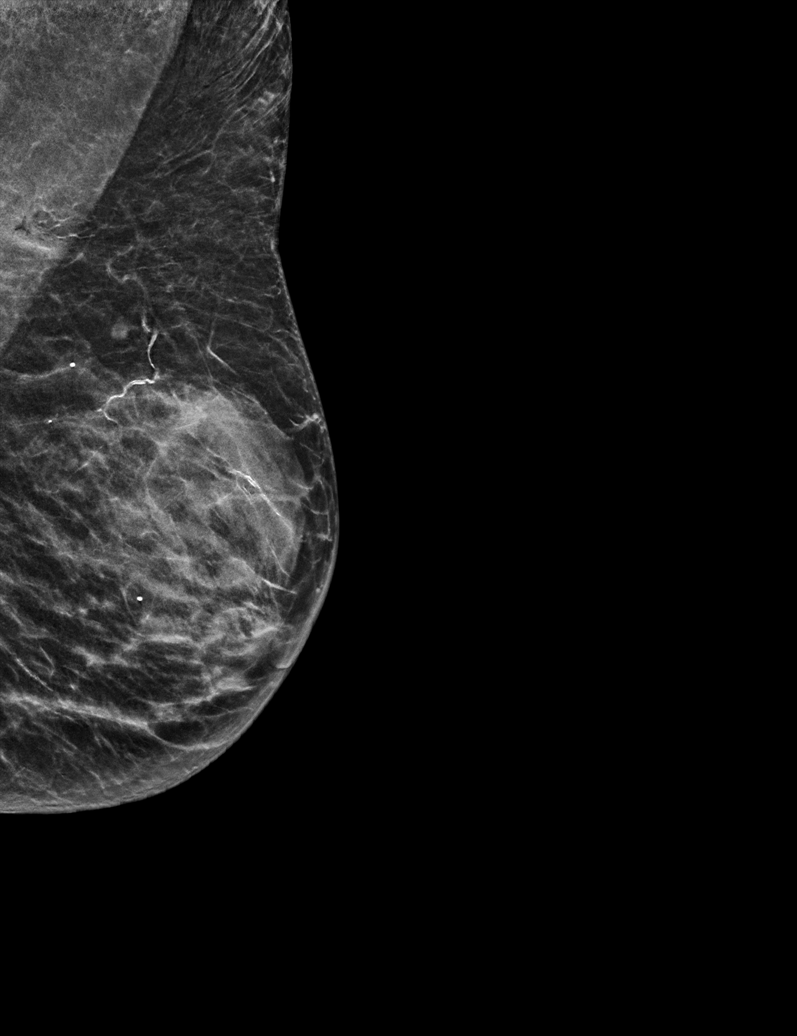

[R CC tomo · tomo slice 23/46.0]
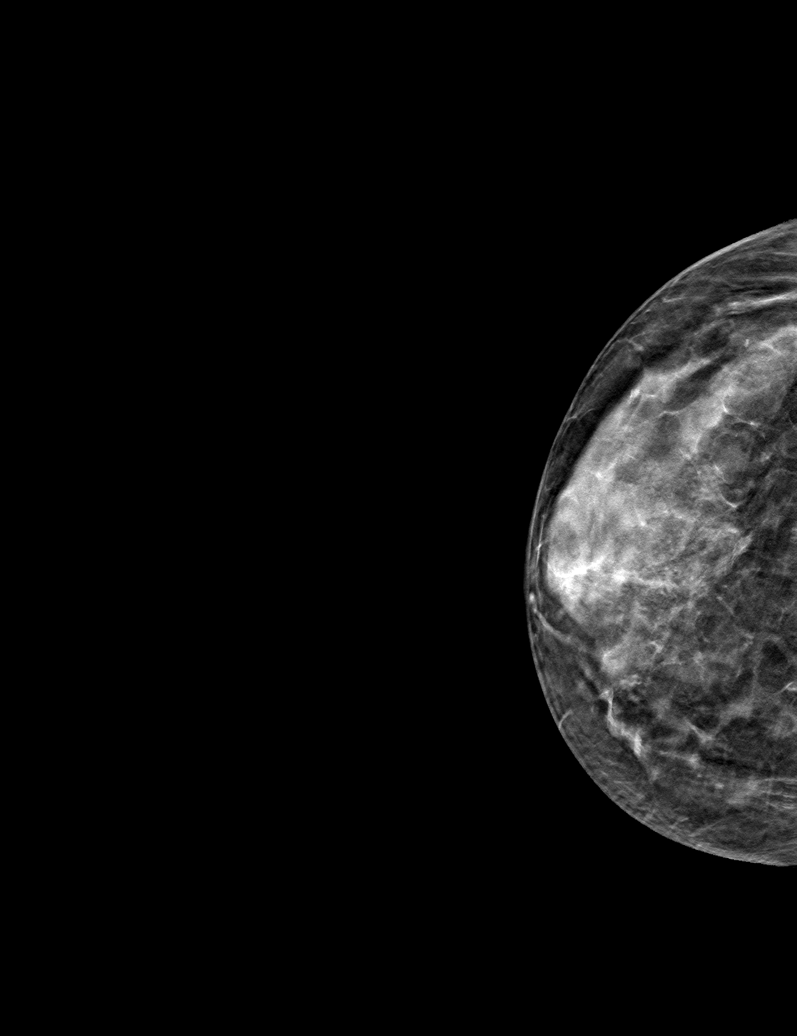

[6 of 30 positions shown; findings below may reference images not displayed]

ACR Breast Density Category c: The breast tissue is heterogeneously
dense, which may obscure small masses.
FINDINGS: There are no findings suspicious for malignancy.
IMPRESSION: No mammographic evidence of malignancy. A result letter of this
screening mammogram will be mailed directly to the patient.

RECOMMENDATION:
Screening mammogram in one year. (Code:Q3-W-BC3)

BI-RADS CATEGORY  1: Negative.

## 2023-02-05 ENCOUNTER — Ambulatory Visit
Admission: RE | Admit: 2023-02-05 | Discharge: 2023-02-05 | Disposition: A | Discharge status: Home / Self Care | Payer: Medicare PPO | Source: Ambulatory Visit | Attending: Acute Care | Admitting: Acute Care

## 2023-02-05 DIAGNOSIS — Z122 Encounter for screening for malignant neoplasm of respiratory organs: Secondary | ICD-10-CM

## 2023-02-05 DIAGNOSIS — Z87891 Personal history of nicotine dependence: Secondary | ICD-10-CM

## 2023-02-06 ENCOUNTER — Ambulatory Visit: Admission: RE | Admit: 2023-02-06 | Payer: Medicare PPO | Source: Ambulatory Visit

## 2023-02-07 ENCOUNTER — Ambulatory Visit: Payer: Medicare PPO | Attending: Family | Admitting: Family

## 2023-02-07 ENCOUNTER — Encounter: Payer: Self-pay | Admitting: Family

## 2023-02-07 VITALS — BP 115/59 | HR 86 | Wt 91.2 lb

## 2023-02-07 DIAGNOSIS — F419 Anxiety disorder, unspecified: Secondary | ICD-10-CM | POA: Diagnosis not present

## 2023-02-07 DIAGNOSIS — Z79899 Other long term (current) drug therapy: Secondary | ICD-10-CM | POA: Diagnosis not present

## 2023-02-07 DIAGNOSIS — Z87891 Personal history of nicotine dependence: Secondary | ICD-10-CM | POA: Insufficient documentation

## 2023-02-07 DIAGNOSIS — J449 Chronic obstructive pulmonary disease, unspecified: Secondary | ICD-10-CM | POA: Diagnosis not present

## 2023-02-07 DIAGNOSIS — I10 Essential (primary) hypertension: Secondary | ICD-10-CM

## 2023-02-07 DIAGNOSIS — R5383 Other fatigue: Secondary | ICD-10-CM | POA: Insufficient documentation

## 2023-02-07 DIAGNOSIS — R0602 Shortness of breath: Secondary | ICD-10-CM | POA: Insufficient documentation

## 2023-02-07 DIAGNOSIS — E785 Hyperlipidemia, unspecified: Secondary | ICD-10-CM | POA: Insufficient documentation

## 2023-02-07 DIAGNOSIS — I110 Hypertensive heart disease with heart failure: Secondary | ICD-10-CM | POA: Insufficient documentation

## 2023-02-07 DIAGNOSIS — I5022 Chronic systolic (congestive) heart failure: Secondary | ICD-10-CM | POA: Diagnosis not present

## 2023-02-07 NOTE — Progress Notes (Signed)
Patient ID: Ashley Brooks, female    DOB: [birth year], 77 y.o.   MRN: [id number]  Primary cardiologist: None PCP: Dorothey Baseman, MD (last seen 01/23)  HPI  Ashley Brooks is a 77 y/o female with a history of hyperlipidemia, HTN, anxeity, COPD, previous tobacco use and chronic heart failure.  Echo 12/03/22: EF of 20-25% along with severe hypokinesis of the left ventricular, mid-apical anterior wall, inferior wall, lateral wall, septal wall and apical segment. 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   There is severe left ventricular systolic dysfunction.   LV end diastolic pressure is severely elevated.   The left ventricular ejection fraction is less than 25% by visual estimate.  Admitted 12/02/22 due to SOB. EMS found several empty wine bottles around her. Troponin elevation to 1393 & cath was done. Diuresed. Developed hyponatremia which improved after diuresing.   She presents today for a HF f/u visit with a chief complaint of minimal SOB with moderate exertion. Chronic in nature. Has associated fatigue and weight loss along with this. Denies difficulty sleeping, dizziness, abdominal distention, palpitations, pedal edema, chest pain, cough or weight gain.   Continues to not take digoxin until she sees her eye doctor later this month.   Was supposed to see PCP after her admission but says that the shuttle at [facility] wasn't working properly the day it was scheduled and she just hasn't thought about rescheduling this.   Past Medical History:  Diagnosis Date   Acute hypoxic respiratory failure (HCC) 12/04/2022   Anxiety    CHF (congestive heart failure) (HCC)    COPD (chronic obstructive pulmonary disease) (HCC)    COPD exacerbation (HCC) 08/25/2018   Elevated troponin 12/03/2022   HLD (hyperlipidemia)    Hypertension    Hypokalemia 09/30/2020   Personal history of tobacco use, presenting  hazards to health 12/06/2015   Past Surgical History:  Procedure Laterality Date   CATARACT EXTRACTION     LEFT HEART CATH AND CORONARY ANGIOGRAPHY Right 12/03/2022   Procedure: LEFT HEART CATH AND CORONARY ANGIOGRAPHY with possible intervention;  Surgeon: Laurier Nancy, MD;  Location: [facility] INVASIVE CV LAB;  Service: Cardiovascular;  Laterality: Right;   right foot fusion     TUBAL LIGATION     Family History  Problem Relation Age of Onset   Colon cancer Mother    Breast cancer Neg Hx    Social History   Tobacco Use   Smoking status: Former    Packs/day: 1.00    Years: 42.00    Additional pack years: 0.00    Total pack years: 42.00    Types: Cigarettes    Quit date: 12/26/2017    Years since quitting: 5.1   Smokeless tobacco: Never  Substance Use Topics   Alcohol use: Yes    Alcohol/week: 2.0 standard drinks of alcohol    Types: 2 Glasses of wine per week    Comment: 2 drinks/ day   No Known Allergies  Prior to Admission medications   Medication Sig Start Date End Date Taking? Authorizing Provider  acetaminophen (TYLENOL) 325 MG tablet Take 2 tablets (650 mg total) by mouth every 4 (four) hours as needed for headache or mild pain. 12/11/22  Yes Pennie Banter, DO  ALPRAZolam Prudy Feeler) 0.25 MG tablet Take 1 tablet (0.25 mg total) by mouth 3 (three) times daily as needed for anxiety. 12/11/22  Yes Pennie Banter,  DO  aspirin 81 MG chewable tablet Chew 1 tablet (81 mg total) by mouth daily. 12/12/22  Yes Esaw GrandchildGriffith, Kelly A, DO  atorvastatin (LIPITOR) 40 MG tablet Take 1 tablet (40 mg total) by mouth daily. 12/12/22  Yes Esaw GrandchildGriffith, Kelly A, DO  feeding supplement (ENSURE ENLIVE / ENSURE PLUS) LIQD Take 237 mLs by mouth 2 (two) times daily between meals. 12/11/22  Yes Esaw GrandchildGriffith, Kelly A, DO  FLUoxetine (PROZAC) 40 MG capsule Take 40 mg by mouth daily.    Yes [provider]  furosemide (LASIX) 20 MG tablet Take 1 tablet (20 mg total) by mouth 2 (two) times daily. HOLD if  dizzy or lightheaded or if BP less than 100/60 Patient taking differently: Take 20 mg by mouth daily. HOLD if dizzy or lightheaded or if BP less than 100/60 12/11/22 12/11/23 Yes Esaw GrandchildGriffith, Kelly A, DO  ipratropium-albuterol (DUONEB) 0.5-2.5 (3) MG/3ML SOLN Take 3 mLs by nebulization every 4 (four) hours as needed. 12/11/22  Yes Esaw GrandchildGriffith, Kelly A, DO  losartan (COZAAR) 25 MG tablet Take 0.5 tablets (12.5 mg total) by mouth daily. 12/12/22  Yes Esaw GrandchildGriffith, Kelly A, DO  melatonin 3 MG TABS tablet Take 3 mg by mouth at bedtime.   Yes [provider]  metoprolol succinate (TOPROL-XL) 25 MG 24 hr tablet Take 0.5 tablets (12.5 mg total) by mouth daily. 12/12/22  Yes Esaw GrandchildGriffith, Kelly A, DO  senna-docusate (SENOKOT-S) 8.6-50 MG tablet Take 1 tablet by mouth 2 (two) times daily as needed for mild constipation. 12/11/22  Yes Esaw GrandchildGriffith, Kelly A, DO  spironolactone (ALDACTONE) 25 MG tablet Take 0.5 tablets (12.5 mg total) by mouth daily. 12/12/22  Yes Esaw GrandchildGriffith, Kelly A, DO  VENTOLIN HFA 108 (90 Base) MCG/ACT inhaler Inhale 2 puffs into the lungs every 4 (four) hours as needed for wheezing. 06/03/18  Yes [provider]  digoxin (LANOXIN) 0.125 MG tablet Take 1 tablet (0.125 mg total) by mouth daily. Patient not taking: Reported on 12/25/2022 12/12/22   Esaw GrandchildGriffith, Kelly A, DO  STIOLTO RESPIMAT 2.5-2.5 MCG/ACT AERS Inhale 2 puffs into the lungs daily. Patient not taking: Reported on 02/07/2023 09/23/20   [provider]   Review of Systems  Constitutional:  Positive for fatigue. Negative for appetite change.  HENT:  Positive for hearing loss. Negative for congestion and postnasal drip.   Eyes:  Positive for visual disturbance (macular degeneration).  Respiratory:  Positive for shortness of breath. Negative for cough and chest tightness.   Cardiovascular:  Negative for chest pain, palpitations and leg swelling.  Gastrointestinal:  Negative for abdominal distention and abdominal pain.  Endocrine:  Negative.   Genitourinary: Negative.   Musculoskeletal:  Negative for back pain and neck pain.  Skin: Negative.   Allergic/Immunologic: Negative.   Neurological:  Negative for dizziness and light-headedness.  Hematological:  Negative for adenopathy. Does not bruise/bleed easily.  Psychiatric/Behavioral:  Negative for dysphoric mood and sleep disturbance (sleeping on 1 pillow). The patient is not nervous/anxious.    Vitals:   02/07/23 1131  BP: (!) 115/59  Pulse: 86  SpO2: 95%  Weight: 91 lb 4 oz (41.4 kg)   Wt Readings from Last 3 Encounters:  02/07/23 91 lb 4 oz (41.4 kg)  12/25/22 97 lb 6.4 oz (44.2 kg)  12/11/22 94 lb 1.6 oz (42.7 kg)   Lab Results  Component Value Date   CREATININE 0.65 12/25/2022   CREATININE 0.59 12/11/2022   CREATININE 0.60 12/10/2022   Physical Exam Vitals and nursing note reviewed.  Constitutional:  Appearance: Normal appearance.  HENT:     Head: Normocephalic and atraumatic.  Cardiovascular:     Rate and Rhythm: Normal rate and regular rhythm.  Pulmonary:     Effort: Pulmonary effort is normal. No respiratory distress.     Breath sounds: No wheezing or rales.  Abdominal:     General: There is no distension.     Palpations: Abdomen is soft.  Musculoskeletal:        General: No tenderness.     Cervical back: Normal range of motion and neck supple.     Right lower leg: No edema.     Left lower leg: No edema.  Skin:    General: Skin is warm and dry.  Neurological:     General: No focal deficit present.     Mental Status: She is alert and oriented to person, place, and time.  Psychiatric:        Mood and Affect: Mood normal.        Behavior: Behavior normal.        Thought Content: Thought content normal.   Assessment & Plan:  1: Chronic heart failure with reduced ejection fraction- - NYHA class II - euvolemic today - weighing daily; reminded to call for overnight weight gain of > 2 pounds or a weekly weight gain of > 5 pounds -  weight down 6 pounds from last visit here 6 weeks ago - not adding salt; says that she lives at [facility] and sometimes the food may taste salty to her - continues to hold digoxin 0.125mg  until she sees ophthalmologist later this month as she already has macular degeneration and she doesn't want to take the change of any other eye issues - continue losartan 12.5mg  daily; BP may not allow for changing to entresto - continue metoprolol 12.5mg  daily - continue spironolactone 12.5mg  daily - dig level 12/07/22 was 1.0 - continue furosemide 20mg  PRN - Echo 12/03/22 showed an EF of 20-25% along with severe hypokinesis of the left ventricular, mid-apical anterior wall, inferior wall, lateral wall, septal wall and apical segment. 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 There is severe left ventricular systolic dysfunction. LV end diastolic pressure is severely elevated. The left ventricular ejection fraction is less than 25% by visual estimate. Adrian Blackwater, MD saw patient during admission; instructed to call his office to get a hospital f/u scheduled and his office number was placed on her AVS - BNP 12/03/22 was 133.8 - PharmD reconciled meds w/ patient  2: HTN- - BP 115/59 - saw PCP (Bronstein) 11/28/21; instructed to call his office and get an appt scheduled - BMP 12/25/22 showed sodium 138, potassium 3.7, creatinine 0.65 and GFR >60  3: COPD- - stiolto respimat 2 puffs daily - albuterol inhaler/ nebulizer PRN  4: Anxiety- - alprazolam 0.25mg  PRN  Due to HF stability, will not make a return appointment at this time. Advised her to get an appt scheduled with cardiologist Welton Flakes) but that she could return at anytime. She was comfortable with this plan.

## 2023-02-07 NOTE — Patient Instructions (Addendum)
Call Dr. Mcarthur Rossetti office to schedule a follow-up appointment.   Call Dr. Milta Deiters office (heart doctor) to get an appointment scheduled. His number is  [phone]   Call us if you need [place] for anything

## 2023-02-08 ENCOUNTER — Other Ambulatory Visit: Payer: Self-pay

## 2023-02-08 DIAGNOSIS — Z122 Encounter for screening for malignant neoplasm of respiratory organs: Secondary | ICD-10-CM

## 2023-02-08 DIAGNOSIS — Z87891 Personal history of nicotine dependence: Secondary | ICD-10-CM

## 2023-02-26 ENCOUNTER — Other Ambulatory Visit: Payer: Self-pay | Admitting: Family Medicine

## 2023-02-26 DIAGNOSIS — Z1231 Encounter for screening mammogram for malignant neoplasm of breast: Secondary | ICD-10-CM

## 2023-10-15 ENCOUNTER — Ambulatory Visit
Admission: RE | Admit: 2023-10-15 | Discharge: 2023-10-15 | Disposition: A | Discharge status: Home / Self Care | Payer: Medicare PPO | Source: Ambulatory Visit | Attending: Family Medicine | Admitting: Family Medicine

## 2023-10-15 DIAGNOSIS — Z1231 Encounter for screening mammogram for malignant neoplasm of breast: Secondary | ICD-10-CM | POA: Insufficient documentation

## 2023-10-21 ENCOUNTER — Other Ambulatory Visit: Payer: Self-pay | Admitting: Family Medicine

## 2023-10-21 DIAGNOSIS — R928 Other abnormal and inconclusive findings on diagnostic imaging of breast: Secondary | ICD-10-CM

## 2023-10-29 ENCOUNTER — Emergency Department: Payer: Medicare PPO

## 2023-10-29 ENCOUNTER — Inpatient Hospital Stay
Admission: EM | Admit: 2023-10-29 | Discharge: 2023-11-06 | DRG: 190 | Disposition: A | Discharge status: Skilled Nursing Facility | Payer: Medicare PPO | Source: Skilled Nursing Facility | Attending: Internal Medicine | Admitting: Internal Medicine

## 2023-10-29 ENCOUNTER — Other Ambulatory Visit: Payer: Self-pay

## 2023-10-29 DIAGNOSIS — F32A Depression, unspecified: Secondary | ICD-10-CM | POA: Diagnosis present

## 2023-10-29 DIAGNOSIS — D72829 Elevated white blood cell count, unspecified: Secondary | ICD-10-CM | POA: Diagnosis present

## 2023-10-29 DIAGNOSIS — Z1152 Encounter for screening for COVID-19: Secondary | ICD-10-CM

## 2023-10-29 DIAGNOSIS — Z87891 Personal history of nicotine dependence: Secondary | ICD-10-CM

## 2023-10-29 DIAGNOSIS — R300 Dysuria: Secondary | ICD-10-CM

## 2023-10-29 DIAGNOSIS — R928 Other abnormal and inconclusive findings on diagnostic imaging of breast: Secondary | ICD-10-CM | POA: Diagnosis present

## 2023-10-29 DIAGNOSIS — R350 Frequency of micturition: Secondary | ICD-10-CM | POA: Diagnosis not present

## 2023-10-29 DIAGNOSIS — Z79899 Other long term (current) drug therapy: Secondary | ICD-10-CM | POA: Diagnosis not present

## 2023-10-29 DIAGNOSIS — Z7982 Long term (current) use of aspirin: Secondary | ICD-10-CM | POA: Diagnosis not present

## 2023-10-29 DIAGNOSIS — F419 Anxiety disorder, unspecified: Secondary | ICD-10-CM | POA: Diagnosis present

## 2023-10-29 DIAGNOSIS — I110 Hypertensive heart disease with heart failure: Secondary | ICD-10-CM | POA: Diagnosis present

## 2023-10-29 DIAGNOSIS — E785 Hyperlipidemia, unspecified: Secondary | ICD-10-CM

## 2023-10-29 DIAGNOSIS — J9601 Acute respiratory failure with hypoxia: Secondary | ICD-10-CM

## 2023-10-29 DIAGNOSIS — I5022 Chronic systolic (congestive) heart failure: Secondary | ICD-10-CM

## 2023-10-29 DIAGNOSIS — R5381 Other malaise: Secondary | ICD-10-CM

## 2023-10-29 DIAGNOSIS — J441 Chronic obstructive pulmonary disease with (acute) exacerbation: Secondary | ICD-10-CM | POA: Diagnosis present

## 2023-10-29 DIAGNOSIS — R0602 Shortness of breath: Secondary | ICD-10-CM | POA: Diagnosis present

## 2023-10-29 DIAGNOSIS — R1310 Dysphagia, unspecified: Secondary | ICD-10-CM

## 2023-10-29 LAB — COMPREHENSIVE METABOLIC PANEL
ALT: 15 U/L (ref 0–44)
AST: 22 U/L (ref 15–41)
Albumin: 4.2 g/dL (ref 3.5–5.0)
Alkaline Phosphatase: 59 U/L (ref 38–126)
Anion gap: 12 (ref 5–15)
BUN: 20 mg/dL (ref 8–23)
CO2: 24 mmol/L (ref 22–32)
Calcium: 8.6 mg/dL — ABNORMAL LOW (ref 8.9–10.3)
Chloride: 100 mmol/L (ref 98–111)
Creatinine, Ser: 0.72 mg/dL (ref 0.44–1.00)
GFR, Estimated: >60 mL/min (ref >60)
Glucose, Bld: 167 mg/dL — ABNORMAL HIGH (ref 70–99)
Potassium: 4.2 mmol/L (ref 3.5–5.1)
Sodium: 136 mmol/L (ref 135–145)
Total Bilirubin: 0.7 mg/dL (ref 0.0–1.2)
Total Protein: 6.7 g/dL (ref 6.5–8.1)

## 2023-10-29 LAB — CBC
HCT: 36.3 % (ref 36.0–46.0)
Hemoglobin: 12.6 g/dL (ref 12.0–15.0)
MCH: 33.7 pg (ref 26.0–34.0)
MCHC: 34.7 g/dL (ref 30.0–36.0)
MCV: 97.1 fL (ref 80.0–100.0)
Platelets: 173 10*3/uL (ref 150–400)
RBC: 3.74 MIL/uL — ABNORMAL LOW (ref 3.87–5.11)
RDW: 12.3 % (ref 11.5–15.5)
WBC: 11.1 10*3/uL — ABNORMAL HIGH (ref 4.0–10.5)
nRBC: 0 % (ref 0.0–0.2)

## 2023-10-29 LAB — BRAIN NATRIURETIC PEPTIDE: B Natriuretic Peptide: 98.8 pg/mL (ref 0.0–100.0)

## 2023-10-29 LAB — TROPONIN I (HIGH SENSITIVITY): Troponin I (High Sensitivity): 16 ng/L (ref <18)

## 2023-10-29 MED ORDER — ONDANSETRON HCL 4 MG/2ML IJ SOLN: 4.0000 mg | Freq: Four times a day (QID) | INTRAMUSCULAR | Status: DC | PRN

## 2023-10-29 MED ORDER — GUAIFENESIN ER 600 MG PO TB12
600.0000 mg | ORAL_TABLET | Freq: Two times a day (BID) | ORAL | Status: DC
  Administered 2023-10-29 – 2023-11-04 (×6): 600 mg via ORAL
  Filled 2023-10-29 (×11): qty 1

## 2023-10-29 MED ORDER — METHYLPREDNISOLONE SODIUM SUCC 125 MG IJ SOLR
125.0000 mg | Freq: Once | INTRAMUSCULAR | Status: AC
  Administered 2023-10-29: 125 mg via INTRAVENOUS
  Filled 2023-10-29: qty 2

## 2023-10-29 MED ORDER — SODIUM CHLORIDE 0.9 % IV SOLN
1.0000 g | INTRAVENOUS | Status: AC
  Administered 2023-10-29 – 2023-11-02 (×5): 1 g via INTRAVENOUS
  Filled 2023-10-29 (×5): qty 10

## 2023-10-29 MED ORDER — PREDNISONE 20 MG PO TABS
40.0000 mg | ORAL_TABLET | Freq: Every day | ORAL | Status: AC
  Administered 2023-10-31 – 2023-11-03 (×4): 40 mg via ORAL
  Filled 2023-10-29 (×4): qty 2

## 2023-10-29 MED ORDER — MAGNESIUM HYDROXIDE 400 MG/5ML PO SUSP
30.0000 mL | Freq: Every day | ORAL | Status: DC | PRN
Start: 2023-10-29 — End: 2023-11-06

## 2023-10-29 MED ORDER — MAGNESIUM SULFATE 2 GM/50ML IV SOLN
2.0000 g | Freq: Once | INTRAVENOUS | Status: AC
  Administered 2023-10-29: 2 g via INTRAVENOUS
  Filled 2023-10-29: qty 50

## 2023-10-29 MED ORDER — IPRATROPIUM-ALBUTEROL 0.5-2.5 (3) MG/3ML IN SOLN
3.0000 mL | Freq: Four times a day (QID) | RESPIRATORY_TRACT | Status: DC
  Administered 2023-10-30 – 2023-10-31 (×6): 3 mL via RESPIRATORY_TRACT
  Filled 2023-10-29 (×6): qty 3

## 2023-10-29 MED ORDER — IPRATROPIUM-ALBUTEROL 0.5-2.5 (3) MG/3ML IN SOLN
3.0000 mL | Freq: Once | RESPIRATORY_TRACT | Status: AC
  Administered 2023-10-29: 3 mL via RESPIRATORY_TRACT
  Filled 2023-10-29: qty 3

## 2023-10-29 MED ORDER — ACETAMINOPHEN 325 MG PO TABS
650.0000 mg | ORAL_TABLET | Freq: Four times a day (QID) | ORAL | Status: DC | PRN
  Administered 2023-10-31: 650 mg via ORAL
  Filled 2023-10-29: qty 2

## 2023-10-29 MED ORDER — ENOXAPARIN SODIUM 40 MG/0.4ML IJ SOSY
40.0000 mg | PREFILLED_SYRINGE | INTRAMUSCULAR | Status: DC
  Administered 2023-10-29: 40 mg via SUBCUTANEOUS
  Filled 2023-10-29: qty 0.4

## 2023-10-29 MED ORDER — METHYLPREDNISOLONE SODIUM SUCC 40 MG IJ SOLR
40.0000 mg | Freq: Two times a day (BID) | INTRAMUSCULAR | Status: AC
  Administered 2023-10-30 (×2): 40 mg via INTRAVENOUS
  Filled 2023-10-29 (×2): qty 1

## 2023-10-29 MED ORDER — ACETAMINOPHEN 650 MG RE SUPP: 650.0000 mg | Freq: Four times a day (QID) | RECTAL | Status: DC | PRN

## 2023-10-29 MED ORDER — HYDROCOD POLI-CHLORPHE POLI ER 10-8 MG/5ML PO SUER: 5.0000 mL | Freq: Two times a day (BID) | ORAL | Status: DC | PRN

## 2023-10-29 MED ORDER — TRAZODONE HCL 50 MG PO TABS: 25.0000 mg | ORAL_TABLET | Freq: Every evening | ORAL | Status: DC | PRN

## 2023-10-29 MED ORDER — ONDANSETRON HCL 4 MG PO TABS: 4.0000 mg | ORAL_TABLET | Freq: Four times a day (QID) | ORAL | Status: DC | PRN

## 2023-10-29 MED ORDER — SODIUM CHLORIDE 0.9 % IV SOLN: INTRAVENOUS | Status: AC

## 2023-10-29 NOTE — Assessment & Plan Note (Addendum)
 Not on oxygen at home -Continue O2 and wean as able -Some mild desaturation when ambulating with PT on 11/01/2023 down to 87%

## 2023-10-29 NOTE — Assessment & Plan Note (Addendum)
 Continue statin.

## 2023-10-29 NOTE — ED Triage Notes (Signed)
Pt to ED via EMS from assisted living with c/o shortness of breath. Pt was found to have an oxygen sat of 68% on RA. Pt was given albuterol in route. Pt reports feeling bad but that the shortness of breath came on all of a sudden.

## 2023-10-29 NOTE — Assessment & Plan Note (Addendum)
-   Continue digoxin - Home meds on hold in setting of soft blood pressure

## 2023-10-29 NOTE — Assessment & Plan Note (Addendum)
-   Suspect precipitated from recent bedbug spraying at the house -If worsens or no significant improvement, will check COVID and RVP - Continue DuoNebs and steroids - Completed 5 days Rocephin

## 2023-10-29 NOTE — H&P (Signed)
 [place]   PATIENT NAME: Ashley Brooks    MR#:  [id number]  DATE OF BIRTH:  [birth year]  DATE OF ADMISSION:  10/29/2023  PRIMARY CARE PHYSICIAN: Glover Lenis, MD   Patient is coming from: Home  REQUESTING/REFERRING PHYSICIAN: [name], MD  CHIEF COMPLAINT:   Chief Complaint  Patient presents with  . Shortness of Breath    HISTORY OF PRESENT ILLNESS:  Ashley Brooks is a 77 y.o. Caucasian female with medical history significant for CHF, COPD, anxiety and hypertension, presented to the emergency room with acute onset of worsening dyspnea which has been significantly worsening since yesterday with associated wheezing with no significant increased cough from her baseline with COPD.  She reported that she saw bedbugs in her home and therefore had her house sprayed yesterday.  She feels she may have had exposure to irritants.  She denied any fever or chills.  No chest pain or palpitations.  No nausea or vomiting or abdominal pain.  No dysuria, oliguria or hematuria or flank pain.  ED Course: When she came to the ER, respiratory rate was 29 with pulse oximetry of 100% on 6 L and later on 2 L of O2 by nasal cannula.  Prior to that the patient was on nonrebreather by EMS.  Vital signs were otherwise within normal.  Labs revealed blood glucose of 167 and calcium  8.6 with otherwise unremarkable CMP.  CBC showed mild leukocytosis of 11.1. EKG as reviewed by me : EKG showed normal sinus rhythm with rate of 93 with left axis deviation and Q waves anteroseptally. Imaging: Portable chest x-ray showed chronic hyperinflation and bronchial thickening with no acute findings.  The patient was given 4 DuoNebs and 2 g of IV magnesium  sulfate as well as 125 mg of IV Solu-Medrol .  She will be admitted to a medical telemetry bed for further evaluation and management. PAST MEDICAL HISTORY:   Past Medical History:  Diagnosis Date  . Acute hypoxic respiratory failure (HCC) 12/04/2022   . Anxiety   . CHF (congestive heart failure) (HCC)   . COPD (chronic obstructive pulmonary disease) (HCC)   . COPD exacerbation (HCC) 08/25/2018  . Elevated troponin 12/03/2022  . HLD (hyperlipidemia)   . Hypertension   . Hypokalemia 09/30/2020  . Personal history of tobacco use, presenting hazards to health 12/06/2015    PAST SURGICAL HISTORY:   Past Surgical History:  Procedure Laterality Date  . CATARACT EXTRACTION    . LEFT HEART CATH AND CORONARY ANGIOGRAPHY Right 12/03/2022   Procedure: LEFT HEART CATH AND CORONARY ANGIOGRAPHY with possible intervention;  Surgeon: Fernand Denyse LABOR, MD;  Location: [facility] INVASIVE CV LAB;  Service: Cardiovascular;  Laterality: Right;  . right foot fusion    . TUBAL LIGATION      SOCIAL HISTORY:   Social History   Tobacco Use  . Smoking status: Former    Current packs/day: 0.00    Average packs/day: 1 pack/day for 42.0 years (42.0 ttl pk-yrs)    Types: Cigarettes    Start date: 12/27/1975    Quit date: 12/26/2017    Years since quitting: 5.8  . Smokeless tobacco: Never  Substance Use Topics  . Alcohol use: Yes    Alcohol/week: 2.0 standard drinks of alcohol    Types: 2 Glasses of wine per week    Comment: 2 drinks/ day    FAMILY HISTORY:   Family History  Problem Relation Age of Onset  . Colon cancer Mother   .  Breast cancer Neg Hx     DRUG ALLERGIES:  No Known Allergies  REVIEW OF SYSTEMS:   ROS As per history of present illness. All pertinent systems were reviewed above. Constitutional, HEENT, cardiovascular, respiratory, GI, GU, musculoskeletal, neuro, psychiatric, endocrine, integumentary and hematologic systems were reviewed and are otherwise negative/unremarkable except for positive findings mentioned above in the HPI.   MEDICATIONS AT HOME:   Prior to Admission medications   Medication Sig Start Date End Date Taking? Authorizing Provider  acetaminophen  (TYLENOL ) 325 MG tablet Take 2 tablets (650 mg total) by mouth  every 4 (four) hours as needed for headache or mild pain. 12/11/22   Fausto Burnard LABOR, DO  ALPRAZolam  (XANAX ) 0.25 MG tablet Take 1 tablet (0.25 mg total) by mouth 3 (three) times daily as needed for anxiety. 12/11/22   Fausto Burnard LABOR, DO  aspirin  81 MG chewable tablet Chew 1 tablet (81 mg total) by mouth daily. 12/12/22   Fausto Burnard LABOR, DO  atorvastatin  (LIPITOR) 40 MG tablet Take 1 tablet (40 mg total) by mouth daily. 12/12/22   Fausto Burnard LABOR, DO  digoxin  (LANOXIN ) 0.125 MG tablet Take 1 tablet (0.125 mg total) by mouth daily. Patient not taking: Reported on 12/25/2022 12/12/22   Fausto Burnard A, DO  feeding supplement (ENSURE ENLIVE / ENSURE PLUS) LIQD Take 237 mLs by mouth 2 (two) times daily between meals. 12/11/22   Fausto Burnard LABOR, DO  FLUoxetine  (PROZAC ) 40 MG capsule Take 40 mg by mouth daily.     [provider]  furosemide  (LASIX ) 20 MG tablet Take 1 tablet (20 mg total) by mouth 2 (two) times daily. HOLD if dizzy or lightheaded or if BP less than 100/60 Patient taking differently: Take 20 mg by mouth daily. HOLD if dizzy or lightheaded or if BP less than 100/60 12/11/22 12/11/23  Fausto Burnard A, DO  ipratropium-albuterol  (DUONEB) 0.5-2.5 (3) MG/3ML SOLN Take 3 mLs by nebulization every 4 (four) hours as needed. 12/11/22   Fausto Burnard LABOR, DO  losartan  (COZAAR ) 25 MG tablet Take 0.5 tablets (12.5 mg total) by mouth daily. 12/12/22   Fausto Burnard A, DO  melatonin 3 MG TABS tablet Take 3 mg by mouth at bedtime.    [provider]  metoprolol  succinate (TOPROL -XL) 25 MG 24 hr tablet Take 0.5 tablets (12.5 mg total) by mouth daily. 12/12/22   Fausto Burnard LABOR, DO  senna-docusate (SENOKOT-S) 8.6-50 MG tablet Take 1 tablet by mouth 2 (two) times daily as needed for mild constipation. 12/11/22   Fausto Burnard LABOR, DO  spironolactone  (ALDACTONE ) 25 MG tablet Take 0.5 tablets (12.5 mg total) by mouth daily. 12/12/22   Fausto Burnard A, DO  STIOLTO RESPIMAT 2.5-2.5  MCG/ACT AERS Inhale 2 puffs into the lungs daily. Patient not taking: Reported on 02/07/2023 09/23/20   [provider]  VENTOLIN  HFA 108 (90 Base) MCG/ACT inhaler Inhale 2 puffs into the lungs every 4 (four) hours as needed for wheezing. 06/03/18   [provider]      VITAL SIGNS:  Blood pressure 97/63, pulse 94, temperature 97.6 F (36.4 C), temperature source Axillary, resp. rate (!) 25, height 5' (1.524 m), weight 40.8 kg, SpO2 100%.  PHYSICAL EXAMINATION:  Physical Exam  GENERAL:  77 y.o.-year-old patient lying in the bed with no acute distress.  EYES: Pupils equal, round, reactive to light and accommodation. No scleral icterus. Extraocular muscles intact.  HEENT: Head atraumatic, normocephalic. Oropharynx and nasopharynx clear.  NECK:  Supple, no jugular venous  distention. No thyroid enlargement, no tenderness.  LUNGS: Diffuse expiratory wheezes with tight expiratory airflow and harsh vesicular breathing. No use of accessory muscles of respiration.  CARDIOVASCULAR: Regular rate and rhythm, S1, S2 normal. No murmurs, rubs, or gallops.  ABDOMEN: Soft, nondistended, nontender. Bowel sounds present. No organomegaly or mass.  EXTREMITIES: No pedal edema, cyanosis, or clubbing.  NEUROLOGIC: Cranial nerves II through XII are intact. Muscle strength 5/5 in all extremities. Sensation intact. Gait not checked.  PSYCHIATRIC: The patient is alert and oriented x 3.  Normal affect and good eye contact. SKIN: No obvious rash, lesion, or ulcer.   LABORATORY PANEL:   CBC Recent Labs  Lab 10/29/23 1942  WBC 11.1*  HGB 12.6  HCT 36.3  PLT 173   ------------------------------------------------------------------------------------------------------------------  Chemistries  Recent Labs  Lab 10/29/23 1942  NA 136  K 4.2  CL 100  CO2 24  GLUCOSE 167*  BUN 20  CREATININE 0.72  CALCIUM  8.6*  AST 22  ALT 15  ALKPHOS 59  BILITOT 0.7    ------------------------------------------------------------------------------------------------------------------  Cardiac Enzymes No results for input(s): TROPONINI in the last 168 hours. ------------------------------------------------------------------------------------------------------------------  RADIOLOGY:  DG Chest Port 1 View Result Date: 10/29/2023 CLINICAL DATA:  Shortness of breath. EXAM: PORTABLE CHEST 1 VIEW COMPARISON:  12/02/2022, lung cancer screening CT 02/05/2023 FINDINGS: Chronic hyperinflation. Mild edematous changes on prior CT are not well seen by radiograph. There is mild chronic bronchial thickening. No focal airspace disease. The heart is normal in size. Aortic atherosclerosis. No pleural fluid or pneumothorax. IMPRESSION: Chronic hyperinflation and bronchial thickening. No acute findings. Electronically Signed   By: Andrea Gasman M.D.   On: 10/29/2023 21:51      IMPRESSION AND PLAN:  Assessment and Plan: COPD exacerbation (HCC) - The patient will be admitted to a medically monitored bed. - We will place the patient IV steroid therapy with IV Solu-Medrol  as well as nebulized bronchodilator therapy with duonebs q.i.d. and q.4 hours p.r.n.SABRA - Mucolytic therapy will be provided with Mucinex  and antibiotic therapy with IV Rocephin . - O2 protocol will be followed. - We will hold off long-acting beta agonist.   Acute respiratory failure with hypoxia (HCC) - This is clearly secondary to #1. - We will follow O2 protocol.  Dyslipidemia - We will continue statin therapy.  Chronic systolic CHF (congestive heart failure) (HCC) - We will continue Toprol -XL, Aldactone  and digoxin  as well as Cozaar .  Anxiety and depression - Will continue Xanax  and Prozac .  DVT prophylaxis: Lovenox .  Advanced Care Planning:  Code Status: full code.  Family Communication:  The plan of care was discussed in details with the patient (and family). I answered all questions.  The patient agreed to proceed with the above mentioned plan. Further management will depend upon hospital course. Disposition Plan: Back to previous home environment Consults called: none.  All the records are reviewed and case discussed with ED provider.  Status is: Inpatient  At the time of the admission, it appears that the appropriate admission status for this patient is inpatient.  This is judged to be reasonable and necessary in order to provide the required intensity of service to ensure the patient's safety given the presenting symptoms, physical exam findings and initial radiographic and laboratory data in the context of comorbid conditions.  The patient requires inpatient status due to high intensity of service, high risk of further deterioration and high frequency of surveillance required.  I certify that at the time of admission, it is my clinical judgment  that the patient will require inpatient hospital care extending more than 2 midnights.                            Dispo: The patient is from: Home              Anticipated d/c is to: Home              Patient currently is not medically stable to d/c.              Difficult to place patient: No  Madison DELENA Peaches M.D on 10/29/2023 at 10:31 PM  [facility]   From 7 PM-7 AM, contact night-coverage [email or web address]  CC: Primary care physician; Glover Lenis, MD

## 2023-10-29 NOTE — ED Provider Notes (Signed)
 [facility] Provider Note    Event Date/Time   First MD Initiated Contact with Patient 10/29/23 1925     (approximate)   History   Shortness of Breath   HPI  Ashley Brooks is a 77 y.o. female with a history of acute hypoxic respiratory failure, COPD, CHF who presents with severe shortness of breath.  EMS has brought the patient in on nonrebreather, have given albuterol  treatment,     Physical Exam   Triage Vital Signs: ED Triage Vitals  Encounter Vitals Group     BP 10/29/23 1947 119/82     Systolic BP Percentile --      Diastolic BP Percentile --      Pulse Rate 10/29/23 1947 90     Resp 10/29/23 1947 (!) 29     Temp 10/29/23 1947 97.6 F (36.4 C)     Temp Source 10/29/23 1947 Axillary     SpO2 10/29/23 1947 100 %     Weight 10/29/23 1948 40.8 kg (90 lb)     Height 10/29/23 1948 1.524 m (5')     Head Circumference --      Peak Flow --      Pain Score 10/29/23 1948 0     Pain Loc --      Pain Education --      Exclude from Growth Chart --     Most recent vital signs: Vitals:   10/29/23 1947 10/29/23 2030  BP: 119/82 97/63  Pulse: 90 94  Resp: (!) 29 (!) 25  Temp: 97.6 F (36.4 C)   SpO2: 100% 100%     General: Awake, sitting straight up, moderate distress CV:  Good peripheral perfusion.  Resp:  Tachypnea with increased respiratory effort, poor airflow Abd:  No distention.  Other:  No lower extremity edema   ED Results / Procedures / Treatments   Labs (all labs ordered are listed, but only abnormal results are displayed) Labs Reviewed  CBC - Abnormal; Notable for the following components:      Result Value   WBC 11.1 (*)    RBC 3.74 (*)    All other components within normal limits  COMPREHENSIVE METABOLIC PANEL - Abnormal; Notable for the following components:   Glucose, Bld 167 (*)    Calcium  8.6 (*)    All other components within normal limits  BRAIN NATRIURETIC PEPTIDE  TROPONIN I (HIGH SENSITIVITY)      EKG  ED ECG REPORT I, Ashley Brooks, the attending physician, personally viewed and interpreted this ECG.  Date: 10/29/2023  Rhythm: normal sinus rhythm QRS Axis: normal Intervals: normal ST/T Wave abnormalities: normal Narrative Interpretation: no evidence of acute ischemia    RADIOLOGY Chest x-ray viewed interpret by me, no pneumothorax, no edema    PROCEDURES:  Critical Care performed: yes  CRITICAL CARE Performed by: Ashley Brooks   Total critical care time: 30 minutes  Critical care time was exclusive of separately billable procedures and treating other patients.  Critical care was necessary to treat or prevent imminent or life-threatening deterioration.  Critical care was time spent personally by me on the following activities: development of treatment plan with patient and/or surrogate as well as nursing, discussions with consultants, evaluation of patient's response to treatment, examination of patient, obtaining history from patient or surrogate, ordering and performing treatments and interventions, ordering and review of laboratory studies, ordering and review of radiographic studies, pulse oximetry and re-evaluation of patient's condition.   Procedures  MEDICATIONS ORDERED IN ED: Medications  methylPREDNISolone  sodium succinate (SOLU-MEDROL ) 125 mg/2 mL injection 125 mg (125 mg Intravenous Given 10/29/23 1933)  magnesium  sulfate IVPB 2 g 50 mL (2 g Intravenous New Bag/Given 10/29/23 1940)  ipratropium-albuterol  (DUONEB) 0.5-2.5 (3) MG/3ML nebulizer solution 3 mL (3 mLs Nebulization Given 10/29/23 1933)  ipratropium-albuterol  (DUONEB) 0.5-2.5 (3) MG/3ML nebulizer solution 3 mL (3 mLs Nebulization Given 10/29/23 1933)  ipratropium-albuterol  (DUONEB) 0.5-2.5 (3) MG/3ML nebulizer solution 3 mL (3 mLs Nebulization Given 10/29/23 1932)  ipratropium-albuterol  (DUONEB) 0.5-2.5 (3) MG/3ML nebulizer solution 3 mL (3 mLs Nebulization Given 10/29/23 2019)      IMPRESSION / MDM / ASSESSMENT AND PLAN / ED COURSE  I reviewed the triage vital signs and the nursing notes. Patient's presentation is most consistent with acute presentation with potential threat to life or bodily function.  Patient presents with severe shortness of breath, on nonrebreather upon arrival.  Highly suspicious for COPD exacerbation, differential includes pneumothorax, CHF  Poor airflow and wheezing on exam consistent with COPD exacerbation, IV Solu-Medrol , IV magnesium , multiple DuoNebs ordered.  Lab work reviewed and overall reassuring,  Chest x-ray without evidence of pneumothorax or CHF  ----------------------------------------- 8:21 PM on 10/29/2023 ----------------------------------------- Patient is feeling somewhat improved  Still with significant wheezing and requiring supplemental oxygen to keep saturations above 90%, will consult the hospitalist for admission      FINAL CLINICAL IMPRESSION(S) / ED DIAGNOSES   Final diagnoses:  COPD exacerbation (HCC)  Shortness of breath  Acute respiratory distress in newborn     Rx / DC Orders   ED Discharge Orders     None        Note:  This document was prepared using Dragon voice recognition software and may include unintentional dictation errors.   Ashley Charleston, MD 10/29/23 2123

## 2023-10-29 NOTE — Assessment & Plan Note (Addendum)
-   continue Xanax and Prozac.

## 2023-10-30 DIAGNOSIS — J441 Chronic obstructive pulmonary disease with (acute) exacerbation: Secondary | ICD-10-CM | POA: Diagnosis not present

## 2023-10-30 DIAGNOSIS — J9601 Acute respiratory failure with hypoxia: Secondary | ICD-10-CM

## 2023-10-30 LAB — BASIC METABOLIC PANEL
Anion gap: 9 (ref 5–15)
BUN: 18 mg/dL (ref 8–23)
CO2: 25 mmol/L (ref 22–32)
Calcium: 8.2 mg/dL — ABNORMAL LOW (ref 8.9–10.3)
Chloride: 102 mmol/L (ref 98–111)
Creatinine, Ser: 0.73 mg/dL (ref 0.44–1.00)
GFR, Estimated: >60 mL/min (ref >60)
Glucose, Bld: 179 mg/dL — ABNORMAL HIGH (ref 70–99)
Potassium: 4.3 mmol/L (ref 3.5–5.1)
Sodium: 136 mmol/L (ref 135–145)

## 2023-10-30 LAB — CBC
HCT: 31.6 % — ABNORMAL LOW (ref 36.0–46.0)
Hemoglobin: 11 g/dL — ABNORMAL LOW (ref 12.0–15.0)
MCH: 33.1 pg (ref 26.0–34.0)
MCHC: 34.8 g/dL (ref 30.0–36.0)
MCV: 95.2 fL (ref 80.0–100.0)
Platelets: 113 10*3/uL — ABNORMAL LOW (ref 150–400)
RBC: 3.32 MIL/uL — ABNORMAL LOW (ref 3.87–5.11)
RDW: 12 % (ref 11.5–15.5)
WBC: 3.2 10*3/uL — ABNORMAL LOW (ref 4.0–10.5)
nRBC: 0 % (ref 0.0–0.2)

## 2023-10-30 MED ORDER — ENOXAPARIN SODIUM 30 MG/0.3ML IJ SOSY
30.0000 mg | PREFILLED_SYRINGE | INTRAMUSCULAR | Status: DC
  Administered 2023-10-30: 30 mg via SUBCUTANEOUS
  Filled 2023-10-30: qty 0.3

## 2023-10-30 NOTE — Progress Notes (Signed)
   10/30/23 1716  Assess: MEWS Score  Temp 98.4 F (36.9 C)  BP (!) 91/50  MAP (mmHg) (!) 63  Pulse Rate (!) 104  Resp 18  SpO2 95 %  O2 Device Room Air  Assess: MEWS Score  MEWS Temp 0  MEWS Systolic 1  MEWS Pulse 1  MEWS RR 0  MEWS LOC 0  MEWS Score 2  MEWS Score Color Yellow  Assess: if the MEWS score is Yellow or Red  Were vital signs accurate and taken at a resting state? Yes  MEWS guidelines implemented  No, previously yellow, continue vital signs every 4 hours  Assess: SIRS CRITERIA  SIRS Temperature  0  SIRS Respirations  0  SIRS Pulse 1  SIRS WBC 0  SIRS Score Sum  1   Patient arrived to floor from ED for COPD exacerbation. She states her breathing is better and is 95% on room air.  She is able to speak in full sentences and appears in no distress.  Patient oriented to room and call bell.  Yellow MEWs in ED due to soft BP and increased pulse, continued q4 hour vitals due to no change from previous.

## 2023-10-30 NOTE — ED Notes (Signed)
 Went to give 8am med. Pt is sleeping with lights off. Respirations are unlabored.

## 2023-10-30 NOTE — Hospital Course (Addendum)
 77yo with h/o chronic systolic CHF, COPD, anxiety, and HTN who presented on 12/31 with SOB, being treated for COPD exacerbation.  She is awaiting placement.

## 2023-10-30 NOTE — Progress Notes (Signed)
 PHARMACIST - PHYSICIAN COMMUNICATION  CONCERNING:  Enoxaparin  (Lovenox ) for DVT Prophylaxis    RECOMMENDATION: Patient was prescribed enoxaprin 40mg  q24 hours for VTE prophylaxis.   Filed Weights   10/29/23 1948  Weight: 40.8 kg (90 lb)    Body mass index is 17.58 kg/m.  Estimated Creatinine Clearance: 37.9 mL/min (by C-G formula based on SCr of 0.73 mg/dL).   Based on [facility] policy patient is candidate for enoxaparin  30mg  every 24 hours based on CrCl <28ml/min or Weight <45kg  DESCRIPTION: Pharmacy has adjusted enoxaparin  dose per [facility] policy.  Patient is now receiving enoxaparin  30 mg every 24 hours    Summer Mccolgan Rodriguez-Guzman PharmD, BCPS 10/30/2023 2:06 PM

## 2023-10-30 NOTE — ED Notes (Signed)
 Went in to give duoneb, pt is sleeping. Restarted NS infusion.

## 2023-10-30 NOTE — ED Notes (Signed)
 Gave applesauce. Pt appears SOB still, marked pursed lip breathing.

## 2023-10-30 NOTE — ED Notes (Signed)
Pt now awake and eating.

## 2023-10-30 NOTE — Progress Notes (Signed)
 Progress Note    Ashley Brooks   FMW:[id number]  DOB: November 20, 1945  DOA: 10/29/2023     1 PCP: Glover Lenis, MD  Initial CC: SOB  Hospital Course: Ashley Brooks is a 78 y.o. Caucasian female with medical history significant for CHF, COPD, anxiety and hypertension, presented to the emergency room with acute onset of worsening dyspnea which has been significantly worsening with associated wheezing with no significant increased cough from her baseline with COPD.   Her residents had been recently sprayed with chemicals for bedbug treatment and afterwards, she developed worsening respiratory status with difficulty breathing. CXR showed chronic hyperinflation with bronchial thickening.  She was started on breathing treatments and steroids for presumed COPD exacerbation.  Interval History:  Was still rather anxious and SOB when seen in ER this morning.  Some improvement later on but still nervous about returning home given the recent spraying which led to exacerbation.   Assessment and Plan: COPD exacerbation (HCC) - Suspect precipitated from recent bedbug spraying at the house -If worsens or no significant improvement, will check COVID and RVP - Continue DuoNebs and steroids - Okay for continuing Rocephin  for now -Has had some improvement while being monitored in the ER but given precipitation from recent spray in the house, will monitor again further overnight   Acute respiratory failure with hypoxia (HCC) Not on oxygen at home -Continue O2 and wean as able  Dyslipidemia - We will continue statin therapy.  Chronic systolic CHF (congestive heart failure) (HCC) - We will continue Toprol -XL, Aldactone  and digoxin  as well as Cozaar .  Anxiety and depression - Will continue Xanax  and Prozac .   Old records reviewed in assessment of this patient  Antimicrobials: Rocephin  12/31 >> current   DVT prophylaxis:  enoxaparin  (LOVENOX ) injection 40 mg Start: 10/29/23  2200   Code Status:   Code Status: Full Code  Mobility Assessment (Last 72 Hours)     Mobility Assessment   No documentation.           Barriers to discharge: none Disposition Plan:  Home HH orders placed: none Status is: Inpt  Objective: Blood pressure (!) 102/56, pulse 80, temperature 98.4 F (36.9 C), temperature source Oral, resp. rate 17, height 5' (1.524 m), weight 40.8 kg, SpO2 97%.  Examination:  Physical Exam Constitutional:      Comments: Thin and anxious appearing  HENT:     Head: Normocephalic and atraumatic.     Mouth/Throat:     Mouth: Mucous membranes are moist.  Eyes:     Extraocular Movements: Extraocular movements intact.  Cardiovascular:     Rate and Rhythm: Normal rate and regular rhythm.  Pulmonary:     Effort: Pulmonary effort is normal. No respiratory distress.     Breath sounds: Normal breath sounds. No wheezing.  Abdominal:     General: Bowel sounds are normal. There is no distension.     Palpations: Abdomen is soft.     Tenderness: There is no abdominal tenderness.  Musculoskeletal:        General: Normal range of motion.     Cervical back: Normal range of motion and neck supple.  Skin:    General: Skin is warm and dry.  Neurological:     General: No focal deficit present.     Mental Status: She is alert.  Psychiatric:        Mood and Affect: Mood normal.      Consultants:    Procedures:    Data Reviewed:  Results for orders placed or performed during the hospital encounter of 10/29/23 (from the past 24 hours)  CBC     Status: Abnormal   Collection Time: 10/29/23  7:42 PM  Result Value Ref Range   WBC 11.1 (H) 4.0 - 10.5 K/uL   RBC 3.74 (L) 3.87 - 5.11 MIL/uL   Hemoglobin 12.6 12.0 - 15.0 g/dL   HCT 63.6 63.9 - 53.9 %   MCV 97.1 80.0 - 100.0 fL   MCH 33.7 26.0 - 34.0 pg   MCHC 34.7 30.0 - 36.0 g/dL   RDW 87.6 88.4 - 84.4 %   Platelets 173 150 - 400 K/uL   nRBC 0.0 0.0 - 0.2 %  Comprehensive metabolic panel      Status: Abnormal   Collection Time: 10/29/23  7:42 PM  Result Value Ref Range   Sodium 136 135 - 145 mmol/L   Potassium 4.2 3.5 - 5.1 mmol/L   Chloride 100 98 - 111 mmol/L   CO2 24 22 - 32 mmol/L   Glucose, Bld 167 (H) 70 - 99 mg/dL   BUN 20 8 - 23 mg/dL   Creatinine, Ser 9.27 0.44 - 1.00 mg/dL   Calcium  8.6 (L) 8.9 - 10.3 mg/dL   Total Protein 6.7 6.5 - 8.1 g/dL   Albumin 4.2 3.5 - 5.0 g/dL   AST 22 15 - 41 U/L   ALT 15 0 - 44 U/L   Alkaline Phosphatase 59 38 - 126 U/L   Total Bilirubin 0.7 0.0 - 1.2 mg/dL   GFR, Estimated >39 >39 mL/min   Anion gap 12 5 - 15  Troponin I (High Sensitivity)     Status: None   Collection Time: 10/29/23  7:42 PM  Result Value Ref Range   Troponin I (High Sensitivity) 16 <18 ng/L  Brain natriuretic peptide     Status: None   Collection Time: 10/29/23  7:42 PM  Result Value Ref Range   B Natriuretic Peptide 98.8 0.0 - 100.0 pg/mL  Basic metabolic panel     Status: Abnormal   Collection Time: 10/30/23  4:46 AM  Result Value Ref Range   Sodium 136 135 - 145 mmol/L   Potassium 4.3 3.5 - 5.1 mmol/L   Chloride 102 98 - 111 mmol/L   CO2 25 22 - 32 mmol/L   Glucose, Bld 179 (H) 70 - 99 mg/dL   BUN 18 8 - 23 mg/dL   Creatinine, Ser 9.26 0.44 - 1.00 mg/dL   Calcium  8.2 (L) 8.9 - 10.3 mg/dL   GFR, Estimated >39 >39 mL/min   Anion gap 9 5 - 15  CBC     Status: Abnormal   Collection Time: 10/30/23  4:46 AM  Result Value Ref Range   WBC 3.2 (L) 4.0 - 10.5 K/uL   RBC 3.32 (L) 3.87 - 5.11 MIL/uL   Hemoglobin 11.0 (L) 12.0 - 15.0 g/dL   HCT 68.3 (L) 63.9 - 53.9 %   MCV 95.2 80.0 - 100.0 fL   MCH 33.1 26.0 - 34.0 pg   MCHC 34.8 30.0 - 36.0 g/dL   RDW 87.9 88.4 - 84.4 %   Platelets 113 (L) 150 - 400 K/uL   nRBC 0.0 0.0 - 0.2 %    I have reviewed pertinent nursing notes, vitals, labs, and images as necessary. I have ordered labwork to follow up on as indicated.  I have reviewed the last notes from staff over past 24 hours. I have discussed patient's  care  plan and test results with nursing staff, CM/SW, and other staff as appropriate.  Time spent: Greater than 50% of the 55 minute visit was spent in counseling/coordination of care for the patient as laid out in the A&P.   LOS: 1 day   Alm Apo, MD [facility] 10/30/2023, 12:47 PM

## 2023-10-30 NOTE — ED Notes (Signed)
 Pt resting quietly with eyes closed. Low bp noted. Spoke with patient. Patient offers that at her normal her blood pressure on the top is in the 80s. Pt denies any symptoms at this time.

## 2023-10-30 NOTE — ED Notes (Signed)
 Pt is on room air now and saturating above 95%. Pt appears more at ease with her breathing.

## 2023-10-30 NOTE — ED Notes (Signed)
 Hospitalist is aware of pt respiratory status.

## 2023-10-30 NOTE — ED Notes (Signed)
 Ashley Girt, NP aware of critical lab results PT 82.5 INR 10.43

## 2023-10-30 NOTE — ED Notes (Signed)
 Pt ambulated with walker to toilet in room. Pt quite anxious. Could have discharged home today but states is scared to because they recently sprayed her apt for bedbugs.

## 2023-10-31 ENCOUNTER — Inpatient Hospital Stay: Payer: Medicare PPO

## 2023-10-31 DIAGNOSIS — J441 Chronic obstructive pulmonary disease with (acute) exacerbation: Secondary | ICD-10-CM | POA: Diagnosis not present

## 2023-10-31 DIAGNOSIS — J9601 Acute respiratory failure with hypoxia: Secondary | ICD-10-CM | POA: Diagnosis not present

## 2023-10-31 LAB — URINALYSIS, ROUTINE W REFLEX MICROSCOPIC
Bilirubin Urine: NEGATIVE
Glucose, UA: NEGATIVE mg/dL
Ketones, ur: NEGATIVE mg/dL
Nitrite: POSITIVE — AB
Protein, ur: NEGATIVE mg/dL
Specific Gravity, Urine: 1.013 (ref 1.005–1.030)
Squamous Epithelial / HPF: 0 /[HPF] (ref 0–5)
pH: 5 (ref 5.0–8.0)

## 2023-10-31 LAB — BRAIN NATRIURETIC PEPTIDE: B Natriuretic Peptide: 328.2 pg/mL — ABNORMAL HIGH (ref 0.0–100.0)

## 2023-10-31 MED ORDER — DIGOXIN 125 MCG PO TABS
0.1250 mg | ORAL_TABLET | Freq: Every day | ORAL | Status: DC
  Administered 2023-10-31 – 2023-11-06 (×7): 0.125 mg via ORAL
  Filled 2023-10-31 (×7): qty 1

## 2023-10-31 MED ORDER — ENOXAPARIN SODIUM 40 MG/0.4ML IJ SOSY
40.0000 mg | PREFILLED_SYRINGE | INTRAMUSCULAR | Status: DC
  Administered 2023-10-31: 40 mg via SUBCUTANEOUS
  Filled 2023-10-31: qty 0.4

## 2023-10-31 MED ORDER — FUROSEMIDE 10 MG/ML IJ SOLN
20.0000 mg | Freq: Once | INTRAMUSCULAR | Status: AC
  Administered 2023-10-31: 20 mg via INTRAVENOUS
  Filled 2023-10-31: qty 4

## 2023-10-31 MED ORDER — IPRATROPIUM-ALBUTEROL 0.5-2.5 (3) MG/3ML IN SOLN
3.0000 mL | Freq: Four times a day (QID) | RESPIRATORY_TRACT | Status: DC
  Administered 2023-10-31 – 2023-11-01 (×4): 3 mL via RESPIRATORY_TRACT
  Filled 2023-10-31 (×4): qty 3

## 2023-10-31 MED ORDER — FLUOXETINE HCL 20 MG PO CAPS
40.0000 mg | ORAL_CAPSULE | Freq: Every day | ORAL | Status: DC
  Administered 2023-10-31 – 2023-11-06 (×7): 40 mg via ORAL
  Filled 2023-10-31 (×7): qty 2

## 2023-10-31 MED ORDER — ALPRAZOLAM 0.25 MG PO TABS
0.2500 mg | ORAL_TABLET | Freq: Once | ORAL | Status: AC
  Administered 2023-10-31: 0.25 mg via ORAL
  Filled 2023-10-31: qty 1

## 2023-10-31 MED ORDER — IPRATROPIUM-ALBUTEROL 0.5-2.5 (3) MG/3ML IN SOLN
3.0000 mL | RESPIRATORY_TRACT | Status: DC | PRN
  Administered 2023-10-31 – 2023-11-01 (×2): 3 mL via RESPIRATORY_TRACT
  Filled 2023-10-31 (×2): qty 3

## 2023-10-31 MED ORDER — ALUM & MAG HYDROXIDE-SIMETH 200-200-20 MG/5ML PO SUSP
30.0000 mL | ORAL | Status: DC | PRN
  Filled 2023-10-31: qty 30

## 2023-10-31 MED ORDER — FUROSEMIDE 10 MG/ML IJ SOLN
20.0000 mg | Freq: Two times a day (BID) | INTRAMUSCULAR | Status: DC
  Administered 2023-10-31 – 2023-11-02 (×4): 20 mg via INTRAVENOUS
  Filled 2023-10-31 (×4): qty 4

## 2023-10-31 NOTE — Plan of Care (Signed)

## 2023-10-31 NOTE — Progress Notes (Signed)
 Progress Note    Ashley Brooks   FMW:[id number]  DOB: 04-30-1946  DOA: 10/29/2023     2 PCP: Glover Lenis, MD  Initial CC: SOB  Hospital Course: Ashley Brooks is a 78 y.o. Caucasian female with medical history significant for CHF, COPD, anxiety and hypertension, presented to the emergency room with acute onset of worsening dyspnea which has been significantly worsening with associated wheezing with no significant increased cough from her baseline with COPD.   Her residents had been recently sprayed with chemicals for bedbug treatment and afterwards, she developed worsening respiratory status with difficulty breathing. CXR showed chronic hyperinflation with bronchial thickening.  She was started on breathing treatments and steroids for presumed COPD exacerbation.  Interval History:  Developed hypoxia and worsening dyspnea overnight with tachypnea.  May have developed some fluid overload with IV fluids.  Lasix  has been reinitiated.  Assessment and Plan: COPD exacerbation (HCC) - Suspect precipitated from recent bedbug spraying at the house -If worsens or no significant improvement, will check COVID and RVP - Continue DuoNebs and steroids - Okay for continuing Rocephin  for now - still very dyspneic, especially again overnight    Acute respiratory failure with hypoxia (HCC) Not on oxygen at home -Continue O2 and wean as able  Dyslipidemia - We will continue statin therapy.  Chronic systolic CHF (congestive heart failure) (HCC) - Continue digoxin  - Home meds on hold in setting of soft blood pressure  Anxiety and depression - continue Xanax  and Prozac .   Old records reviewed in assessment of this patient  Antimicrobials: Rocephin  12/31 >> current   DVT prophylaxis:  enoxaparin  (LOVENOX ) injection 40 mg Start: 10/31/23 2200   Code Status:   Code Status: Full Code  Mobility Assessment (Last 72 Hours)     Mobility Assessment     Row Name 10/31/23 1000  10/31/23 0800 10/30/23 2048 10/30/23 1700     Does patient have an order for bedrest or is patient medically unstable No - Continue assessment No - Continue assessment No - Continue assessment No - Continue assessment    What is the highest level of mobility based on the progressive mobility assessment? Level 4 (Walks with assist in room) - Balance while marching in place and cannot step forward and back - Complete Level 4 (Walks with assist in room) - Balance while marching in place and cannot step forward and back - Complete Level 4 (Walks with assist in room) - Balance while marching in place and cannot step forward and back - Complete Level 4 (Walks with assist in room) - Balance while marching in place and cannot step forward and back - Complete             Barriers to discharge: none Disposition Plan:  Home HH orders placed: none Status is: Inpt  Objective: Blood pressure 125/81, pulse 93, temperature 98 F (36.7 C), resp. rate 18, height 5' (1.524 m), weight 40.8 kg, SpO2 96%.  Examination:  Physical Exam Constitutional:      Comments: Thin and anxious appearing  HENT:     Head: Normocephalic and atraumatic.     Mouth/Throat:     Mouth: Mucous membranes are moist.  Eyes:     Extraocular Movements: Extraocular movements intact.  Cardiovascular:     Rate and Rhythm: Normal rate and regular rhythm.  Pulmonary:     Effort: Pulmonary effort is normal. No respiratory distress.     Breath sounds: Normal breath sounds. No wheezing.  Abdominal:  General: Bowel sounds are normal. There is no distension.     Palpations: Abdomen is soft.     Tenderness: There is no abdominal tenderness.  Musculoskeletal:        General: Normal range of motion.     Cervical back: Normal range of motion and neck supple.  Skin:    General: Skin is warm and dry.  Neurological:     General: No focal deficit present.     Mental Status: She is alert.  Psychiatric:        Mood and Affect: Mood  normal.      Consultants:    Procedures:    Data Reviewed: Results for orders placed or performed during the hospital encounter of 10/29/23 (from the past 24 hours)  Brain natriuretic peptide     Status: Abnormal   Collection Time: 10/31/23  5:05 AM  Result Value Ref Range   B Natriuretic Peptide 328.2 (H) 0.0 - 100.0 pg/mL  Urinalysis, Routine w reflex microscopic -Urine, Clean Catch     Status: Abnormal   Collection Time: 10/31/23 11:37 AM  Result Value Ref Range   Color, Urine YELLOW (A) YELLOW   APPearance CLEAR (A) CLEAR   Specific Gravity, Urine 1.013 1.005 - 1.030   pH 5.0 5.0 - 8.0   Glucose, UA NEGATIVE NEGATIVE mg/dL   Hgb urine dipstick SMALL (A) NEGATIVE   Bilirubin Urine NEGATIVE NEGATIVE   Ketones, ur NEGATIVE NEGATIVE mg/dL   Protein, ur NEGATIVE NEGATIVE mg/dL   Nitrite POSITIVE (A) NEGATIVE   Leukocytes,Ua TRACE (A) NEGATIVE   RBC / HPF 11-20 0 - 5 RBC/hpf   WBC, UA 6-10 0 - 5 WBC/hpf   Bacteria, UA RARE (A) NONE SEEN   Squamous Epithelial / HPF 0 0 - 5 /HPF   Mucus PRESENT     I have reviewed pertinent nursing notes, vitals, labs, and images as necessary. I have ordered labwork to follow up on as indicated.  I have reviewed the last notes from staff over past 24 hours. I have discussed patient's care plan and test results with nursing staff, CM/SW, and other staff as appropriate.  Time spent: Greater than 50% of the 55 minute visit was spent in counseling/coordination of care for the patient as laid out in the A&P.   LOS: 2 days   Alm Apo, MD [facility] 10/31/2023, 1:46 PM

## 2023-10-31 NOTE — Progress Notes (Signed)
       CROSS COVER NOTE  NAME: DYANNA SEITER MRN: [id number] DOB : [birth year]    Concern as stated by nurse / staff   Notiefied by nurse patient with increased work of breathing and decreased sat     Pertinent findings on chart review:   Assessment and  Interventions   Assessment:    10/30/2023   11:54 PM 10/30/2023    8:36 PM 10/30/2023    5:16 PM  Vitals with BMI  Systolic 99 99 91  Diastolic 52 49 50  Pulse 66 82 104   Assessment Increased work of breathing rate of 32. Oxygen sats decreased to 84% on room air, per nurse report, and now ar at 100% on 3.5 liters Duoneb therapy currently only scheduled Chest xray reviewed by me, no significant interval change from prior on 12/31 read by radiology with possible pleural effusion BNP 328.2 up from 98.8 on admit Plan: Furosemide  20 mg x 1 dose. Further eval fpr CHF exacerbation per day team         Erminio LITTIE Cone NP [facility] Cross Cover 7pm-7am - check amion for availability Pager [phone]

## 2023-10-31 NOTE — TOC Initial Note (Signed)
 Transition of Care [facility]) - Initial/Assessment Note    Patient Details  Name: Ashley Brooks MRN: [id number] Date of Birth: 1946/09/26  [facility] CM/SW Contact:    Corean ONEIDA Haddock, RN Phone Number: 10/31/2023, 2:47 PM  Clinical Narrative:                    Admitted for: COPD Admitted from: [facility] independent PCP: Glover   PT OT eval pending Currently requiring 3 L acute O2     Patient Goals and CMS Choice            Expected Discharge Plan and Services                                              Prior Living Arrangements/Services                       Activities of Daily Living   ADL Screening (condition at time of admission) Independently performs ADLs?: Yes (appropriate for developmental age) Is the patient deaf or have difficulty hearing?: Yes Does the patient have difficulty seeing, even when wearing glasses/contacts?: Yes Does the patient have difficulty concentrating, remembering, or making decisions?: No  Permission Sought/Granted                  Emotional Assessment              Admission diagnosis:  Shortness of breath [R06.02] COPD exacerbation (HCC) [J44.1] Acute respiratory distress in newborn [P22.9] Patient Active Problem List   Diagnosis Date Noted   COPD exacerbation (HCC) 10/29/2023   Dyslipidemia 10/29/2023   Anxiety and depression 10/29/2023   Chronic systolic CHF (congestive heart failure) (HCC) 10/29/2023   Acute respiratory failure with hypoxia (HCC) 10/29/2023   Orthostatic hypotension 12/07/2022   Pressure injury of skin 12/04/2022   Acute systolic CHF (congestive heart failure) (HCC) 12/03/2022   Hyponatremia 10/01/2020   Depression with anxiety 09/30/2020   Acute on chronic respiratory failure with hypoxia (HCC) 09/30/2020   UTI (urinary tract infection) 09/30/2020   Malnutrition of moderate degree 01/02/2018   COPD with acute exacerbation (HCC) 01/01/2018    Anxiety 01/01/2018   HLD (hyperlipidemia) 01/01/2018   Tobacco use 12/06/2015   PCP:  Glover Lenis, MD Pharmacy:   [facility] [phone] - [place], [place] - [address] [place] [address] Phone: [phone] Fax: [phone]  [facility] - [place], [place] - [address] [place] [address] Phone: [phone] Fax: [phone]     Social Drivers of Health (SDOH) Social History: SDOH Screenings   Food Insecurity: No Food Insecurity (10/30/2023)  Housing: Unknown (10/30/2023)  Transportation Needs: No Transportation Needs (10/30/2023)  Utilities: Not At Risk (10/30/2023)  Tobacco Use: Medium Risk (06/04/2023)   Received from [facility]   SDOH Interventions:     Readmission Risk Interventions     No data to display

## 2023-10-31 NOTE — Progress Notes (Signed)
 0230- Called into pt room by charge RN for pt  c/o Sob after being assisted to bedside commode. Upon assess pt sitting on the edge of bed with increased work of breathing RR in the 30's, now on 2 liters oxygen at 100%, 84 on room air. WENDI Cone NP on unit and notified by charge Rn, and at bedside.  New orders received for chest xray and prn nebs. Pt reports relief after nebulizer treatment , RR 24, visible ease in work of breathing.

## 2023-11-01 DIAGNOSIS — R300 Dysuria: Secondary | ICD-10-CM | POA: Diagnosis not present

## 2023-11-01 DIAGNOSIS — R1310 Dysphagia, unspecified: Secondary | ICD-10-CM | POA: Diagnosis not present

## 2023-11-01 DIAGNOSIS — R5381 Other malaise: Secondary | ICD-10-CM

## 2023-11-01 DIAGNOSIS — J441 Chronic obstructive pulmonary disease with (acute) exacerbation: Secondary | ICD-10-CM | POA: Diagnosis not present

## 2023-11-01 MED ORDER — IPRATROPIUM-ALBUTEROL 0.5-2.5 (3) MG/3ML IN SOLN
3.0000 mL | Freq: Three times a day (TID) | RESPIRATORY_TRACT | Status: DC
  Administered 2023-11-01 – 2023-11-04 (×8): 3 mL via RESPIRATORY_TRACT
  Filled 2023-11-01 (×9): qty 3

## 2023-11-01 MED ORDER — ENOXAPARIN SODIUM 30 MG/0.3ML IJ SOSY
30.0000 mg | PREFILLED_SYRINGE | INTRAMUSCULAR | Status: DC
  Administered 2023-11-01 – 2023-11-05 (×5): 30 mg via SUBCUTANEOUS
  Filled 2023-11-01 (×5): qty 0.3

## 2023-11-01 MED ORDER — ALPRAZOLAM 0.25 MG PO TABS
0.2500 mg | ORAL_TABLET | Freq: Two times a day (BID) | ORAL | Status: DC | PRN
  Administered 2023-11-01: 0.25 mg via ORAL
  Filled 2023-11-01: qty 1

## 2023-11-01 NOTE — Evaluation (Signed)
 Clinical/Bedside Swallow Evaluation Patient Details  Name: Ashley Brooks MRN: [id number] Date of Birth: [birth year]  Today's Date: 11/01/2023 Time: SLP Start Time (ACUTE ONLY): 1358 SLP Stop Time (ACUTE ONLY): 1419 SLP Time Calculation (min) (ACUTE ONLY): 21 min  Past Medical History:  Past Medical History:  Diagnosis Date   Acute hypoxic respiratory failure (HCC) 12/04/2022   Anxiety    CHF (congestive heart failure) (HCC)    COPD (chronic obstructive pulmonary disease) (HCC)    COPD exacerbation (HCC) 08/25/2018   Elevated troponin 12/03/2022   HLD (hyperlipidemia)    Hypertension    Hypokalemia 09/30/2020   Personal history of tobacco use, presenting hazards to health 12/06/2015   Past Surgical History:  Past Surgical History:  Procedure Laterality Date   CATARACT EXTRACTION     LEFT HEART CATH AND CORONARY ANGIOGRAPHY Right 12/03/2022   Procedure: LEFT HEART CATH AND CORONARY ANGIOGRAPHY with possible intervention;  Surgeon: Fernand Denyse LABOR, MD;  Location: [facility] INVASIVE [facility];  Service: Cardiovascular;  Laterality: Right;   right foot fusion     TUBAL LIGATION     HPI:  78yo female admitted 10/29/23 with severe SOB. PMH: anxiety, depression, HTN, acute hypoxic respiratory failure, COPD, CHF. CXR = no focal consolidation    Assessment / Plan / Recommendation  Clinical Impression  Pt presents with suspected primary esophageal dysphagia. Pt has ill-fitting upper and lower dentures, related to 20 pound weight loss over the past year. CN exam is unremarkable. Pt reports globus sensation with solid foods and some pills, no difficulty reported with liquids. Pt accepted trials of thin liquid, puree, and solids. Extended oral prep with solid texture, likely due to ill-fitting dentures. No overt s/s aspiration following any presentation, however, pt reported ongoing globus sensation after solid trial (graham cracker). Pt indicates time, belching or liquid wash assist with alleviating  globus sensation. Pt was receptive to education regarding behavioral and dietary strategies for management of esophageal dysmotility. Written information was provided. Recommend continuing with current diet to provide full range of PO choices to maximize adequate PO intake. Also recommend consideration of a regular barium swallow (esophagram) to evaluate esophageal motility. SLP will follow up to continue education after results are available. RN and MD informed of results and recommendations.  SLP Visit Diagnosis: Dysphagia, unspecified (R13.10)    Aspiration Risk  Moderate aspiration risk;Risk for inadequate nutrition/hydration    Diet Recommendation Regular;Thin liquid    Liquid Administration via: Cup;Straw Medication Administration: Other (Comment) (as tolerated) Supervision: Patient able to self feed Compensations: Minimize environmental distractions;Slow rate;Small sips/bites Postural Changes: Remain upright for at least 30 minutes after po intake;Seated upright at 90 degrees    Other  Recommendations Recommended Consults: Consider esophageal assessment Oral Care Recommendations: Oral care BID    Recommendations for follow up therapy are one component of a multi-disciplinary discharge planning process, led by the attending physician.  Recommendations may be updated based on patient status, additional functional criteria and insurance authorization.  Follow up Recommendations Follow physician's recommendations for discharge plan and follow up therapies      Assistance Recommended at Discharge  TBD  Functional Status Assessment Patient has had a recent decline in their functional status and/or demonstrates limited ability to make significant improvements in function in a reasonable and predictable amount of time  Frequency and Duration min 1 x/week  1 week       Prognosis Prognosis for improved oropharyngeal function: Fair      Swallow Study   General  Date of Onset:  10/29/23 HPI: 78yo female admitted 10/29/23 with severe SOB. PMH: anxiety, depression, HTN, acute hypoxic respiratory failure, COPD, CHF. CXR = no focal consolidation Type of Study: Bedside Swallow Evaluation Previous Swallow Assessment: none found Diet Prior to this Study: Regular;Thin liquids (Level 0) Temperature Spikes Noted: No Respiratory Status: Nasal cannula History of Recent Intubation: No Behavior/Cognition: Alert Oral Cavity Assessment: Within Functional Limits Oral Care Completed by SLP: No Oral Cavity - Dentition: Dentures, top;Dentures, bottom (ill fitting due to 20 pound weight loss over the past year) Vision: Functional for self-feeding Self-Feeding Abilities: Able to feed self Patient Positioning: Upright in chair Baseline Vocal Quality: Normal Volitional Cough: Weak Volitional Swallow: Able to elicit    Oral/Motor/Sensory Function Overall Oral Motor/Sensory Function: Within functional limits   Ice Chips   Not tested  Thin Liquid Thin Liquid: Within functional limits Presentation: Straw    Nectar Thick Nectar Thick Liquid: Not tested   Honey Thick Honey Thick Liquid: Not tested   Puree Puree: Within functional limits Presentation: Spoon;Self Fed   Solid     Solid: Impaired Oral Phase Impairments: Impaired mastication Oral Phase Functional Implications: Prolonged oral transit;Impaired mastication (due to ill-fitting dentures) Other Comments: pt reports globus sensation     Ashley Brooks, MSP, CCC-SLP Speech Language Pathologist  Ashley Brooks 11/01/2023,2:29 PM

## 2023-11-01 NOTE — Progress Notes (Signed)
 Progress Note    Ashley Brooks   FMW:[id number]  DOB: 04-12-1946  DOA: 10/29/2023     3 PCP: Glover Lenis, MD  Initial CC: SOB  Hospital Course: Ashley Brooks is a 78 y.o. Caucasian female with medical history significant for CHF, COPD, anxiety and hypertension, presented to the emergency room with acute onset of worsening dyspnea which has been significantly worsening with associated wheezing with no significant increased cough from her baseline with COPD.   Her residents had been recently sprayed with chemicals for bedbug treatment and afterwards, she developed worsening respiratory status with difficulty breathing. CXR showed chronic hyperinflation with bronchial thickening.  She was started on breathing treatments and steroids for presumed COPD exacerbation.  Interval History:  Breathing more comfortably this morning.  No events overnight otherwise. Still noting some dysuria but has been on rocephin  since admission.   Assessment and Plan: * COPD exacerbation (HCC) - Suspect precipitated from recent bedbug spraying at the house -If worsens or no significant improvement, will check COVID and RVP - Continue DuoNebs and steroids - Complete 5 days Rocephin    Acute respiratory failure with hypoxia (HCC) Not on oxygen at home -Continue O2 and wean as able -Some mild desaturation when ambulating with PT on 11/01/2023 down to 87%  Physical deconditioning - Resides at independent living and now with some deconditioning during hospitalization - PT/OT recommending SNF - Patient might improve during hospitalization with further therapy, follow for progression otherwise would still pursue SNF after the weekend  Dysphagia - Appreciate SLP eval, performed 11/01/2023 - Patient okay for continuing on regular diet - Hold off on barium swallow for now  Chronic systolic CHF (congestive heart failure) (HCC) - Continue digoxin  - Home meds on hold in setting of soft blood  pressure  Dysuria - Patient started complaining of some dysuria on 10/31/2023 - UA noted with positive nitrite, trace LE, 6-10 WBC, rare bacteria; not fully consistent with infection but given patient being symptomatic, she is already on Rocephin  for COPD exacerbation and theoretically covered -Planning to continue total of 5 days Rocephin  or at least 5 days total antibiotic course if discharged before completion -Noted history of Pseudomonas on urine culture in December 2021  Anxiety and depression - continue Xanax  and Prozac .  Dyslipidemia - Continue statin   Old records reviewed in assessment of this patient  Antimicrobials: Rocephin  12/31 >> current   DVT prophylaxis:  enoxaparin  (LOVENOX ) injection 30 mg Start: 11/01/23 2200   Code Status:   Code Status: Full Code  Mobility Assessment (Last 72 Hours)     Mobility Assessment     Row Name 11/01/23 1206 11/01/23 1110 11/01/23 1109 11/01/23 0930 10/31/23 2052   Does patient have an order for bedrest or is patient medically unstable -- No - Continue assessment -- No - Continue assessment No - Continue assessment   What is the highest level of mobility based on the progressive mobility assessment? Level 5 (Walks with assist in room/hall) - Balance while stepping forward/back and can walk in room with assist - Complete Level 5 (Walks with assist in room/hall) - Balance while stepping forward/back and can walk in room with assist - Complete Level 4 (Walks with assist in room) - Balance while marching in place and cannot step forward and back - Complete Level 5 (Walks with assist in room/hall) - Balance while stepping forward/back and can walk in room with assist - Complete Level 4 (Walks with assist in room) - Balance while marching in  place and cannot step forward and back - Complete    Row Name 10/31/23 1525 10/31/23 1000 10/31/23 0800 10/30/23 2048 10/30/23 1700   Does patient have an order for bedrest or is patient medically  unstable No - Continue assessment No - Continue assessment No - Continue assessment No - Continue assessment No - Continue assessment   What is the highest level of mobility based on the progressive mobility assessment? Level 4 (Walks with assist in room) - Balance while marching in place and cannot step forward and back - Complete Level 4 (Walks with assist in room) - Balance while marching in place and cannot step forward and back - Complete Level 4 (Walks with assist in room) - Balance while marching in place and cannot step forward and back - Complete Level 4 (Walks with assist in room) - Balance while marching in place and cannot step forward and back - Complete Level 4 (Walks with assist in room) - Balance while marching in place and cannot step forward and back - Complete            Barriers to discharge: none Disposition Plan:  Home HH orders placed: none Status is: Inpt  Objective: Blood pressure 106/65, pulse 92, temperature 97.7 F (36.5 C), resp. rate 19, height 5' (1.524 m), weight 40.8 kg, SpO2 100%.  Examination:  Physical Exam Constitutional:      Comments: Thin and anxious appearing  HENT:     Head: Normocephalic and atraumatic.     Mouth/Throat:     Mouth: Mucous membranes are moist.  Eyes:     Extraocular Movements: Extraocular movements intact.  Cardiovascular:     Rate and Rhythm: Normal rate and regular rhythm.  Pulmonary:     Effort: Pulmonary effort is normal. No respiratory distress.     Breath sounds: Normal breath sounds. No wheezing.  Abdominal:     General: Bowel sounds are normal. There is no distension.     Palpations: Abdomen is soft.     Tenderness: There is no abdominal tenderness.  Musculoskeletal:        General: Normal range of motion.     Cervical back: Normal range of motion and neck supple.  Skin:    General: Skin is warm and dry.  Neurological:     General: No focal deficit present.     Mental Status: She is alert.  Psychiatric:         Mood and Affect: Mood normal.      Consultants:    Procedures:    Data Reviewed: No results found for this or any previous visit (from the past 24 hours).   I have reviewed pertinent nursing notes, vitals, labs, and images as necessary. I have ordered labwork to follow up on as indicated.  I have reviewed the last notes from staff over past 24 hours. I have discussed patient's care plan and test results with nursing staff, CM/SW, and other staff as appropriate.  Time spent: Greater than 50% of the 55 minute visit was spent in counseling/coordination of care for the patient as laid out in the A&P.   LOS: 3 days   Alm Apo, MD [facility] 11/01/2023, 2:49 PM

## 2023-11-01 NOTE — Progress Notes (Signed)
 SATURATION QUALIFICATIONS: (This note is used to comply with regulatory documentation for home oxygen)  Patient Saturations on Room Air at Rest = 100%  Patient Saturations on Room Air while Ambulating = 89%  Patient Saturations on 2 Liters of oxygen while Ambulating = 99%  Please briefly explain why patient needs home oxygen: DOE while up and ambulating without oxygen

## 2023-11-01 NOTE — Progress Notes (Signed)
 Voiced having a difficulty swallowing. Has experience this in the past. Witness patient eating a piece of breakfast potato, coughing noted. MD notified.

## 2023-11-01 NOTE — Care Management Important Message (Signed)
 Important Message  Patient Details  Name: Ashley Brooks MRN: [id number] Date of Birth: Jul 03, 1946   Important Message Given:  Yes - Medicare IM     Sherilyn Banker 11/01/2023, 12:11 PM

## 2023-11-01 NOTE — Evaluation (Signed)
 Occupational Therapy Evaluation Patient Details Name: Ashley Brooks MRN: [id number] DOB: 10/31/1945 Today's Date: 11/01/2023   History of Present Illness Pt is a 78 yo female with medical history significant for CHF, COPD, anxiety and hypertension, presented to the emergency room with acute onset of worsening dyspnea which has been significantly worsening with associated wheezing with no significant increased cough from her baseline with COPD, admitted for potential COPD exacerbation.   Clinical Impression   Ashley Brooks was seen for OT evaluation this date. Prior to hospital admission, pt was generally independent with ADL management with assistance from [facility] staff for house keeping, meals, and transportation. Pt presents to acute OT demonstrating impaired ADL performance and functional mobility 2/2 decreased cardiopulmonary status, generalized weakness, and decreased activity tolerance (See OT problem list for additional functional deficits). Pt currently requires MIN A for LB ADL management, CGA for safety with brief functional transfers and toileting.  Pt would benefit from skilled OT services to address noted impairments and functional limitations (see below for any additional details) in order to maximize safety and independence while minimizing falls risk and caregiver burden. Anticipate the need for follow up OT services upon acute hospital DC.        If plan is discharge home, recommend the following: A little help with walking and/or transfers;Assist for transportation;Assistance with cooking/housework;A lot of help with bathing/dressing/bathroom    Functional Status Assessment  Patient has had a recent decline in their functional status and demonstrates the ability to make significant improvements in function in a reasonable and predictable amount of time.  Equipment Recommendations  BSC/3in1    Recommendations for Other Services       Precautions / Restrictions  Precautions Precautions: Fall Restrictions Weight Bearing Restrictions Per Provider Order: No      Mobility Bed Mobility Overal bed mobility: Needs Assistance             General bed mobility comments: NT pt in recliner at start/end of session    Transfers Overall transfer level: Needs assistance Equipment used: Rolling walker (2 wheels), 1 person hand held assist Transfers: Sit to/from Stand, Bed to chair/wheelchair/BSC Sit to Stand: Min assist     Step pivot transfers: Contact guard assist     General transfer comment: CGA for safety with RW for step pivot to [facility]      Balance Overall balance assessment: Needs assistance Sitting-balance support: Feet supported Sitting balance-Leahy Scale: Good     Standing balance support: Bilateral upper extremity supported, Reliant on assistive device for balance, During functional activity Standing balance-Leahy Scale: Fair                             ADL either performed or assessed with clinical judgement   ADL Overall ADL's : Needs assistance/impaired                     Lower Body Dressing: Minimal assistance;Sit to/from stand Lower Body Dressing Details (indicate cue type and reason): MIN A to pull mesh underwear over hips. Toilet Transfer: BSC/3in1;Contact guard assist;Rolling walker (2 wheels)   Toileting- Clothing Manipulation and Hygiene: Minimal assistance;Cueing for safety;Sit to/from stand       Functional mobility during ADLs: Rolling walker (2 wheels);Contact guard assist;Cueing for safety       Vision Baseline Vision/History: 6 Macular Degeneration;1 Wears glasses Ability to See in Adequate Light: 3 Highly impaired Patient Visual Report: No change from baseline  Perception         Praxis         Pertinent Vitals/Pain Pain Assessment Pain Assessment: No/denies pain     Extremity/Trunk Assessment Upper Extremity Assessment Upper Extremity Assessment: Generalized  weakness   Lower Extremity Assessment Lower Extremity Assessment: Generalized weakness       Communication Communication Communication: Hearing impairment   Cognition Arousal: Alert Behavior During Therapy: Anxious, WFL for tasks assessed/performed Overall Cognitive Status: Within Functional Limits for tasks assessed                                       General Comments  Pt fatigues quickly, SpO2 drops from 93% to 89% with functional transfer HR noted to reach 120's with minimal exertion.    Exercises Other Exercises Other Exercises: Pt educated on role of OT in acute setting, safety, falls prevention strategies, and energy conservation techniques for improved safety and functional independence during ADL management.   Shoulder Instructions      Home Living Family/patient expects to be discharged to:: Assisted living                             Home Equipment: Rolling Walker (2 wheels);Rollator (4 wheels);Shower seat   Additional Comments: Ashley Brooks      Prior Functioning/Environment Prior Level of Function : Independent/Modified Independent             Mobility Comments: Ambulates w/ rollator ADLs Comments: Pt performs most B/IADLs INDly. Facility provides 3 meals/day, light housekeeping, and transportation. Pt able to dress, toilet, bathe, do laundry w/o assist. She does not drive, 2/2 baseline visual impairment.        OT Problem List: Decreased strength;Decreased coordination;Cardiopulmonary status limiting activity;Decreased activity tolerance;Decreased safety awareness;Impaired balance (sitting and/or standing);Decreased knowledge of use of DME or AE      OT Treatment/Interventions: Self-care/ADL training;Therapeutic exercise;Therapeutic activities;DME and/or AE instruction;Balance training;Patient/family education;Energy conservation    OT Goals(Current goals can be found in the care plan section) Acute Rehab OT Goals Patient  Stated Goal: to feel better OT Goal Formulation: With patient Time For Goal Achievement: 11/15/23 Potential to Achieve Goals: Good ADL Goals Pt Will Perform Grooming: sitting;with modified independence;with adaptive equipment Pt Will Perform Lower Body Dressing: sit to/from stand;with set-up;with supervision;with adaptive equipment Pt Will Transfer to Toilet: with modified independence;bedside commode;ambulating Pt Will Perform Toileting - Clothing Manipulation and hygiene: with modified independence;sit to/from stand;with adaptive equipment  OT Frequency: Min 1X/week    Co-evaluation              AM-PAC OT 6 Clicks Daily Activity     Outcome Measure Help from another person eating meals?: None Help from another person taking care of personal grooming?: A Little Help from another person toileting, which includes using toliet, bedpan, or urinal?: A Little Help from another person bathing (including washing, rinsing, drying)?: A Lot Help from another person to put on and taking off regular upper body clothing?: A Little Help from another person to put on and taking off regular lower body clothing?: A Lot 6 Click Score: 17   End of Session Equipment Utilized During Treatment: Gait belt;Rolling walker (2 wheels) Nurse Communication: Mobility status  Activity Tolerance: Patient tolerated treatment well Patient left: in chair;with call bell/phone within reach;with chair alarm set  OT Visit Diagnosis: Other abnormalities of gait and  mobility (R26.89);Muscle weakness (generalized) (M62.81)                Time: 8993-8978 OT Time Calculation (min): 15 min Charges:  OT General Charges $OT Visit: 1 Visit OT Evaluation $OT Eval Moderate Complexity: 1 Mod  Jhonny Pelton, M.S., OTR/L 11/01/23, 12:15 PM

## 2023-11-01 NOTE — Assessment & Plan Note (Addendum)
-   Appreciate SLP eval, performed 11/01/2023 - Patient okay for continuing on regular diet - Hold off on barium swallow; can be done outpatient if still deemed needed

## 2023-11-01 NOTE — Assessment & Plan Note (Addendum)
-   Resides at independent living and now with some deconditioning during hospitalization - PT/OT recommending SNF - Patient with slow improvement.  Likely will need SNF at this rate

## 2023-11-01 NOTE — Assessment & Plan Note (Addendum)
-   Patient started complaining of some dysuria on 10/31/2023 - UA noted with positive nitrite, trace LE, 6-10 WBC, rare bacteria; not fully consistent with infection but given patient being symptomatic, she is already on Rocephin  for COPD exacerbation and theoretically covered -Completed 5 days Rocephin  during hospitalization -Noted history of Pseudomonas on urine culture in December 2021

## 2023-11-01 NOTE — Evaluation (Signed)
 Physical Therapy Evaluation Patient Details Name: Ashley Brooks MRN: [id number] DOB: 1946/06/02 Today's Date: 11/01/2023  History of Present Illness  Pt is a 78 yo female with medical history significant for CHF, COPD, anxiety and hypertension, presented to the emergency room with acute onset of worsening dyspnea which has been significantly worsening with associated wheezing with no significant increased cough from her baseline with COPD, admitted for potential COPD exacerbation.   Clinical Impression  Pt alert, agreeable to mobility with some encouragement, pt anxious throughout. Pt with WOB throughout mobility as well, needed several long rest breaks during mobility. spO2 on room air at rest 90% or greater, with mobility did decrease to 87%, RN/MD notified. Pt very challenged by activity tolerance/endurance this session, unable to ambulate >79ft with RW and CGA.  Overall the patient demonstrated deficits (see PT Problem List) that impede the patient's functional abilities, safety, and mobility and would benefit from skilled PT intervention.          If plan is discharge home, recommend the following: A little help with walking and/or transfers;A little help with bathing/dressing/bathroom;Assistance with cooking/housework;Assist for transportation;Help with stairs or ramp for entrance   Can travel by private vehicle   No    Equipment Recommendations Other (comment) (TBD)  Recommendations for Other Services       Functional Status Assessment Patient has had a recent decline in their functional status and demonstrates the ability to make significant improvements in function in a reasonable and predictable amount of time.     Precautions / Restrictions Precautions Precautions: Fall Restrictions Weight Bearing Restrictions Per Provider Order: No      Mobility  Bed Mobility Overal bed mobility: Needs Assistance Bed Mobility: Supine to Sit     Supine to sit: Supervision, Used  rails, HOB elevated     General bed mobility comments: very elevated HOB, significant time to complete transfer    Transfers Overall transfer level: Needs assistance Equipment used: Rolling walker (2 wheels), 1 person hand held assist Transfers: Sit to/from Stand, Bed to chair/wheelchair/BSC Sit to Stand: Min assist   Step pivot transfers: Min assist       General transfer comment: minA handheld assist to transfer to [facility]. with RW sit <> stand CGA    Ambulation/Gait Ambulation/Gait assistance: Contact guard assist Gait Distance (Feet): 4 Feet Assistive device: Rolling walker (2 wheels)         General Gait Details: pt very limited by SOB, fatigue, incontinence  Stairs            Wheelchair Mobility     Tilt Bed    Modified Rankin (Stroke Patients Only)       Balance Overall balance assessment: Needs assistance Sitting-balance support: Feet supported Sitting balance-Leahy Scale: Good Sitting balance - Comments: pericare in sitting   Standing balance support: Bilateral upper extremity supported Standing balance-Leahy Scale: Poor                               Pertinent Vitals/Pain Pain Assessment Pain Assessment: No/denies pain    Home Living Family/patient expects to be discharged to:: Assisted living                 Home Equipment: Agricultural Consultant (2 wheels);Rollator (4 wheels);Shower seat Additional Comments: [name]    Prior Function Prior Level of Function : Independent/Modified Independent             Mobility Comments: Ambulates  w/ rollator ADLs Comments: Pt performs most B/IADLs INDly. Facility provides 3 meals/day, light housekeeping, and transportation. Pt able to dress, toilet, bathe, do laundry w/o assist. She does not drive, 2/2 visual impairment.     Extremity/Trunk Assessment   Upper Extremity Assessment Upper Extremity Assessment: Generalized weakness    Lower Extremity Assessment Lower Extremity  Assessment: Generalized weakness       Communication   Communication Communication: Hearing impairment  Cognition Arousal: Alert Behavior During Therapy: Anxious, WFL for tasks assessed/performed Overall Cognitive Status: Within Functional Limits for tasks assessed                                          General Comments      Exercises Other Exercises Other Exercises: BSC use, supervision in sitting and pericare   Assessment/Plan    PT Assessment Patient needs continued PT services  PT Problem List Decreased strength;Decreased activity tolerance;Decreased balance;Decreased mobility;Cardiopulmonary status limiting activity       PT Treatment Interventions DME instruction;Balance training;Gait training;Neuromuscular re-education;Stair training;Functional mobility training;Patient/family education;Therapeutic activities;Therapeutic exercise    PT Goals (Current goals can be found in the Care Plan section)  Acute Rehab PT Goals Patient Stated Goal: to breathe better PT Goal Formulation: With patient Time For Goal Achievement: 11/15/23 Potential to Achieve Goals: Good    Frequency Min 1X/week     Co-evaluation               AM-PAC PT 6 Clicks Mobility  Outcome Measure Help needed turning from your back to your side while in a flat bed without using bedrails?: A Little Help needed moving from lying on your back to sitting on the side of a flat bed without using bedrails?: A Little Help needed moving to and from a bed to a chair (including a wheelchair)?: A Little Help needed standing up from a chair using your arms (e.g., wheelchair or bedside chair)?: A Little   Help needed climbing 3-5 steps with a railing? : A Little 6 Click Score: 15    End of Session Equipment Utilized During Treatment: Gait belt Activity Tolerance: Treatment limited secondary to medical complications (Comment);Other (comment) (limited by SOB) Patient left: in chair;with  call bell/phone within reach (RN notified of no chair alarm in room) Nurse Communication: Mobility status;Other (comment) (oxygen status) PT Visit Diagnosis: Other abnormalities of gait and mobility (R26.89);Difficulty in walking, not elsewhere classified (R26.2);Muscle weakness (generalized) (M62.81)    Time: 9074-9046 PT Time Calculation (min) (ACUTE ONLY): 28 min   Charges:   PT Evaluation $PT Eval Low Complexity: 1 Low PT Treatments $Therapeutic Activity: 23-37 mins PT General Charges $$ ACUTE PT VISIT: 1 Visit         Doyal Shams PT, DPT 11:15 AM,11/01/23

## 2023-11-02 ENCOUNTER — Encounter: Payer: Self-pay | Admitting: Family Medicine

## 2023-11-02 DIAGNOSIS — J441 Chronic obstructive pulmonary disease with (acute) exacerbation: Secondary | ICD-10-CM | POA: Diagnosis not present

## 2023-11-02 DIAGNOSIS — J9601 Acute respiratory failure with hypoxia: Secondary | ICD-10-CM | POA: Diagnosis not present

## 2023-11-02 MED ORDER — FUROSEMIDE 10 MG/ML IJ SOLN
20.0000 mg | Freq: Two times a day (BID) | INTRAMUSCULAR | Status: DC
  Filled 2023-11-02: qty 4

## 2023-11-02 NOTE — Progress Notes (Signed)
 Physical Therapy Treatment Patient Details Name: Ashley Brooks MRN: [id number] DOB: [birth year] Today's Date: 11/02/2023   History of Present Illness Pt is a 78 yo female with medical history significant for CHF, COPD, anxiety and hypertension, presented to the emergency room with acute onset of worsening dyspnea which has been significantly worsening with associated wheezing with no significant increased cough from her baseline with COPD, admitted for potential COPD exacerbation.    PT Comments  Pt was sitting in recliner upon arrival with 2 L o2 [place] on. Even at rest, pt presents with slight SOB. Encouraged participation however pt endorses severe fatigue and only wanting to return to bed. Ive been up almost all day. Im just tired. Pt requested PT return tomorrow to advance gait/activity tolerance. Pt is severely limited by poor activity tolerance/weakness. DC recs remain appropriate however if pt demonstrated improved tolerance to activity, may progress and will update recs.    If plan is discharge home, recommend the following: A little help with walking and/or transfers;A little help with bathing/dressing/bathroom;Assistance with cooking/housework;Assist for transportation;Help with stairs or ramp for entrance     Equipment Recommendations  Other (comment) (defer to next level of care)       Precautions / Restrictions Precautions Precautions: Fall Restrictions Weight Bearing Restrictions Per Provider Order: No     Mobility  Bed Mobility Overal bed mobility: Needs Assistance Bed Mobility: Sit to Supine  Sit to supine: Supervision     Transfers Overall transfer level: Needs assistance Equipment used: Rolling walker (2 wheels), 1 person hand held assist Transfers: Sit to/from Stand, Bed to chair/wheelchair/BSC Sit to Stand: Min assist  General transfer comment: min assist to stand from low recliner surface. pt did not want to attempt ambulation further than back to bed due to  fatigue. Slight SOB noted even at rest prior to standing    Ambulation/Gait Ambulation/Gait assistance: Contact guard assist Gait Distance (Feet): 3 Feet Assistive device: Rolling walker (2 wheels) Gait Pattern/deviations: Step-to pattern Gait velocity: decreased  General Gait Details: Pt did not want to ambulate further distances due to fatigue. pt took a few steps to return to bed but will need further ambulation if pt planning to DC home from acute hospital.    Balance Overall balance assessment: Needs assistance Sitting-balance support: Feet supported Sitting balance-Leahy Scale: Good     Standing balance support: Bilateral upper extremity supported, Reliant on assistive device for balance, During functional activity Standing balance-Leahy Scale: Good Standing balance comment: pt states she uses bariatric rollator at baseline. No LOB with RW use    Cognition Arousal: Alert Behavior During Therapy: WFL for tasks assessed/performed Overall Cognitive Status: Within Functional Limits for tasks assessed   General Comments: pt is HOH but A and O x 4               Pertinent Vitals/Pain Pain Assessment Pain Assessment: No/denies pain     PT Goals (current goals can now be found in the care plan section) Acute Rehab PT Goals Patient Stated Goal: to breathe better Progress towards PT goals: Progressing toward goals    Frequency    Min 1X/week       AM-PAC PT 6 Clicks Mobility   Outcome Measure  Help needed turning from your back to your side while in a flat bed without using bedrails?: A Little Help needed moving from lying on your back to sitting on the side of a flat bed without using bedrails?: A Little Help needed moving to  and from a bed to a chair (including a wheelchair)?: A Little Help needed standing up from a chair using your arms (e.g., wheelchair or bedside chair)?: A Little Help needed to walk in hospital room?: A Little Help needed climbing 3-5  steps with a railing? : A Lot 6 Click Score: 17    End of Session Equipment Utilized During Treatment: Oxygen Activity Tolerance: Patient tolerated treatment well;Patient limited by fatigue Patient left: in bed;with call bell/phone within reach;with bed alarm set Nurse Communication: Mobility status PT Visit Diagnosis: Other abnormalities of gait and mobility (R26.89);Difficulty in walking, not elsewhere classified (R26.2);Muscle weakness (generalized) (M62.81)     Time: 8466-8456 PT Time Calculation (min) (ACUTE ONLY): 10 min  Charges:    $Therapeutic Activity: 8-22 mins PT General Charges $$ ACUTE PT VISIT: 1 Visit                     Rankin Essex PTA 11/02/23, 3:54 PM

## 2023-11-02 NOTE — Progress Notes (Signed)
 Mobility Specialist - Progress Note    11/02/23 1100  Mobility  Activity Stood at bedside;Ambulated with assistance in room  Assistive Device Front wheel walker  Mobility Referral Yes  Mobility visit 1 Mobility  Mobility Specialist Start Time (ACUTE ONLY) 1015  Mobility Specialist Stop Time (ACUTE ONLY) 1035  Mobility Specialist Time Calculation (min) (ACUTE ONLY) 20 min   Pt resting in bed on 2L upon entry. Pt STS and ambulates to end of bed and backs up to recliner. Pt pt left in recliner with needs in reach. Bed alarm activated.   Guido Rumble Mobility Specialist 11/02/23, 11:21 AM

## 2023-11-02 NOTE — NC FL2 (Signed)
 [place]  MEDICAID FL2 LEVEL OF CARE FORM     IDENTIFICATION  Patient Name: Ashley Brooks: 1945/12/27 Sex: female Admission Date (Current Location): 10/29/2023  [facility] and [place] Number:  Chiropodist and Address:  [facility], [address], [place], [place] [address]      Provider Number: [id number]  Attending Physician Name and Address:  Patsy Lenis, MD  Relative Name and Phone Number:       Current Level of Care: Hospital Recommended Level of Care: Skilled Nursing Facility Prior Approval Number:    Date Approved/Denied:   PASRR Number: [id number]  Discharge Plan:      Current Diagnoses: Patient Active Problem List   Diagnosis Date Noted   Dysphagia 11/01/2023   Physical deconditioning 11/01/2023   Dysuria 11/01/2023   COPD exacerbation (HCC) 10/29/2023   Dyslipidemia 10/29/2023   Anxiety and depression 10/29/2023   Chronic systolic CHF (congestive heart failure) (HCC) 10/29/2023   Acute respiratory failure with hypoxia (HCC) 10/29/2023   Orthostatic hypotension 12/07/2022   Pressure injury of skin 12/04/2022   Acute systolic CHF (congestive heart failure) (HCC) 12/03/2022   Hyponatremia 10/01/2020   Depression with anxiety 09/30/2020   Acute on chronic respiratory failure with hypoxia (HCC) 09/30/2020   UTI (urinary tract infection) 09/30/2020   Malnutrition of moderate degree 01/02/2018   COPD with acute exacerbation (HCC) 01/01/2018   Anxiety 01/01/2018   HLD (hyperlipidemia) 01/01/2018   Tobacco use 12/06/2015    Orientation RESPIRATION BLADDER Height & Weight     Self, Time, Situation, Place  O2 Continent Weight: 90 lb (40.8 kg) Height:  5' (152.4 cm)  BEHAVIORAL SYMPTOMS/MOOD NEUROLOGICAL BOWEL NUTRITION STATUS      Continent Diet (heart)  AMBULATORY STATUS COMMUNICATION OF NEEDS Skin   Limited Assist Verbally Normal                       Personal Care Assistance Level of Assistance   Bathing, Feeding, Dressing Bathing Assistance: Limited assistance Feeding assistance: Independent Dressing Assistance: Limited assistance     Functional Limitations Info  Hearing   Hearing Info: Impaired      SPECIAL CARE FACTORS FREQUENCY  PT (By licensed PT), OT (By licensed OT)     PT Frequency: 5 times per week OT Frequency: 5 times per week            Contractures      Additional Factors Info  Code Status, Allergies Code Status Info: full Allergies Info: nka           Current Medications (11/02/2023):  This is the current hospital active medication list Current Facility-Administered Medications  Medication Dose Route Frequency Provider Last Rate Last Admin   acetaminophen  (TYLENOL ) tablet 650 mg  650 mg Oral Q6H PRN Mansy, Jan A, MD   650 mg at 10/31/23 2052   Or   acetaminophen  (TYLENOL ) suppository 650 mg  650 mg Rectal Q6H PRN Mansy, Jan A, MD       ALPRAZolam  (XANAX ) tablet 0.25 mg  0.25 mg Oral BID PRN Patsy Lenis, MD   0.25 mg at 11/01/23 2114   alum & mag hydroxide-simeth (MAALOX/MYLANTA) 200-200-20 MG/5ML suspension 30 mL  30 mL Oral Q4H PRN Patsy Lenis, MD       cefTRIAXone  (ROCEPHIN ) 1 g in sodium chloride  0.9 % 100 mL IVPB  1 g Intravenous Q24H Mansy, Madison LABOR, MD   Stopped at 11/01/23 2149   chlorpheniramine-HYDROcodone (TUSSIONEX) 10-8 MG/5ML  suspension 5 mL  5 mL Oral Q12H PRN Mansy, Jan A, MD       digoxin  (LANOXIN ) tablet 0.125 mg  0.125 mg Oral Daily Patsy Lenis, MD   0.125 mg at 11/02/23 9170   enoxaparin  (LOVENOX ) injection 30 mg  30 mg Subcutaneous Q24H Grubb, Rodney D, RPH   30 mg at 11/01/23 2114   FLUoxetine  (PROZAC ) capsule 40 mg  40 mg Oral Daily Patsy Lenis, MD   40 mg at 11/02/23 9170   furosemide  (LASIX ) injection 20 mg  20 mg Intravenous BID Patsy Lenis, MD   20 mg at 11/02/23 9171   guaiFENesin  (MUCINEX ) 12 hr tablet 600 mg  600 mg Oral BID Mansy, Jan A, MD   600 mg at 11/01/23 2114   ipratropium-albuterol  (DUONEB) 0.5-2.5  (3) MG/3ML nebulizer solution 3 mL  3 mL Nebulization Q4H PRN Jesus America, NP   3 mL at 11/01/23 1802   ipratropium-albuterol  (DUONEB) 0.5-2.5 (3) MG/3ML nebulizer solution 3 mL  3 mL Nebulization TID Patsy Lenis, MD   3 mL at 11/02/23 9170   magnesium  hydroxide (MILK OF MAGNESIA) suspension 30 mL  30 mL Oral Daily PRN Mansy, Jan A, MD       ondansetron  (ZOFRAN ) tablet 4 mg  4 mg Oral Q6H PRN Mansy, Jan A, MD       Or   ondansetron  (ZOFRAN ) injection 4 mg  4 mg Intravenous Q6H PRN Mansy, Jan A, MD       predniSONE  (DELTASONE ) tablet 40 mg  40 mg Oral Q breakfast Mansy, Jan A, MD   40 mg at 11/02/23 9170   traZODone  (DESYREL ) tablet 25 mg  25 mg Oral QHS PRN Mansy, Madison LABOR, MD         Discharge Medications: Please see discharge summary for a list of discharge medications.  Relevant Imaging Results:  Relevant Lab Results:   Additional Information SS #: [id number]  Ashley Pollok E Shayana Hornstein, LCSW

## 2023-11-02 NOTE — TOC Progression Note (Signed)
 Transition of Care [facility]) - Progression Note    Patient Details  Name: Ashley Brooks MRN: [id number] Date of Birth: 1946-08-06  [facility]) CM/SW Contact  Dangela How E Lahari Suttles, LCSW Phone Number: 11/02/2023, 1:18 PM  Clinical Narrative:    CSW spoke to patient at bedside regarding SNF recommendation. Patient is agreeable to SNF, prefers local. Would like to review Medicare ratings of bed offers when available. CSW started SNF work up.        Expected Discharge Plan and Services                                               Social Determinants of Health (SDOH) Interventions SDOH Screenings   Food Insecurity: No Food Insecurity (10/30/2023)  Housing: Low Risk  (11/02/2023)  Transportation Needs: No Transportation Needs (10/30/2023)  Utilities: Not At Risk (10/30/2023)  Social Connections: Unknown (10/31/2023)  Tobacco Use: Medium Risk (11/02/2023)    Readmission Risk Interventions     No data to display

## 2023-11-02 NOTE — Progress Notes (Signed)
 Progress Note    Ashley Brooks   FMW:[id number]  DOB: 04/24/1946  DOA: 10/29/2023     4 PCP: Glover Lenis, MD  Initial CC: SOB  Hospital Course: Ashley Brooks is a 78 y.o. Caucasian female with medical history significant for CHF, COPD, anxiety and hypertension, presented to the emergency room with acute onset of worsening dyspnea which has been significantly worsening with associated wheezing with no significant increased cough from her baseline with COPD.   Her residents had been recently sprayed with chemicals for bedbug treatment and afterwards, she developed worsening respiratory status with difficulty breathing. CXR showed chronic hyperinflation with bronchial thickening.  She was started on breathing treatments and steroids for presumed COPD exacerbation.  Interval History:  Feels about the same.  Seems to be nervous about going back home to live alone and has been considering going to assisted living.  Assessment and Plan: * COPD exacerbation (HCC) - Suspect precipitated from recent bedbug spraying at the house -If worsens or no significant improvement, will check COVID and RVP - Continue DuoNebs and steroids - Complete 5 days Rocephin    Acute respiratory failure with hypoxia (HCC) Not on oxygen at home -Continue O2 and wean as able -Some mild desaturation when ambulating with PT on 11/01/2023 down to 87%  Physical deconditioning - Resides at independent living and now with some deconditioning during hospitalization - PT/OT recommending SNF - Patient might improve during hospitalization with further therapy, follow for progression otherwise would still pursue SNF after the weekend  Dysphagia - Appreciate SLP eval, performed 11/01/2023 - Patient okay for continuing on regular diet - Hold off on barium swallow for now  Chronic systolic CHF (congestive heart failure) (HCC) - Continue digoxin  - Home meds on hold in setting of soft blood pressure  Dysuria -  Patient started complaining of some dysuria on 10/31/2023 - UA noted with positive nitrite, trace LE, 6-10 WBC, rare bacteria; not fully consistent with infection but given patient being symptomatic, she is already on Rocephin  for COPD exacerbation and theoretically covered -Planning to continue total of 5 days Rocephin  or at least 5 days total antibiotic course if discharged before completion -Noted history of Pseudomonas on urine culture in December 2021  Anxiety and depression - continue Xanax  and Prozac .  Dyslipidemia - Continue statin   Old records reviewed in assessment of this patient  Antimicrobials: Rocephin  12/31 >> 11/02/2023  DVT prophylaxis:  enoxaparin  (LOVENOX ) injection 30 mg Start: 11/01/23 2200   Code Status:   Code Status: Full Code  Mobility Assessment (Last 72 Hours)     Mobility Assessment     Row Name 11/02/23 1100 11/01/23 2100 11/01/23 1206 11/01/23 1110 11/01/23 1109   Does patient have an order for bedrest or is patient medically unstable No - Continue assessment No - Continue assessment -- No - Continue assessment --   What is the highest level of mobility based on the progressive mobility assessment? Level 6 (Walks independently in room and hall) - Balance while walking in room without assist - Complete Level 5 (Walks with assist in room/hall) - Balance while stepping forward/back and can walk in room with assist - Complete Level 5 (Walks with assist in room/hall) - Balance while stepping forward/back and can walk in room with assist - Complete Level 5 (Walks with assist in room/hall) - Balance while stepping forward/back and can walk in room with assist - Complete Level 4 (Walks with assist in room) - Balance while marching in place and  cannot step forward and back - Complete    Row Name 11/01/23 0930 10/31/23 2052 10/31/23 1525 10/31/23 1000 10/31/23 0800   Does patient have an order for bedrest or is patient medically unstable No - Continue assessment No -  Continue assessment No - Continue assessment No - Continue assessment No - Continue assessment   What is the highest level of mobility based on the progressive mobility assessment? Level 5 (Walks with assist in room/hall) - Balance while stepping forward/back and can walk in room with assist - Complete Level 4 (Walks with assist in room) - Balance while marching in place and cannot step forward and back - Complete Level 4 (Walks with assist in room) - Balance while marching in place and cannot step forward and back - Complete Level 4 (Walks with assist in room) - Balance while marching in place and cannot step forward and back - Complete Level 4 (Walks with assist in room) - Balance while marching in place and cannot step forward and back - Complete    Row Name 10/30/23 2048 10/30/23 1700         Does patient have an order for bedrest or is patient medically unstable No - Continue assessment No - Continue assessment      What is the highest level of mobility based on the progressive mobility assessment? Level 4 (Walks with assist in room) - Balance while marching in place and cannot step forward and back - Complete Level 4 (Walks with assist in room) - Balance while marching in place and cannot step forward and back - Complete               Barriers to discharge: none Disposition Plan:  Home HH orders placed: none Status is: Inpt  Objective: Blood pressure 106/61, pulse 73, temperature 98.2 F (36.8 C), temperature source Oral, resp. rate 18, height 5' (1.524 m), weight 40.8 kg, SpO2 100%.  Examination:  Physical Exam Constitutional:      Comments: Thin and anxious appearing  HENT:     Head: Normocephalic and atraumatic.     Mouth/Throat:     Mouth: Mucous membranes are moist.  Eyes:     Extraocular Movements: Extraocular movements intact.  Cardiovascular:     Rate and Rhythm: Normal rate and regular rhythm.  Pulmonary:     Effort: Pulmonary effort is normal. No respiratory  distress.     Breath sounds: Normal breath sounds. No wheezing.  Abdominal:     General: Bowel sounds are normal. There is no distension.     Palpations: Abdomen is soft.     Tenderness: There is no abdominal tenderness.  Musculoskeletal:        General: Normal range of motion.     Cervical back: Normal range of motion and neck supple.  Skin:    General: Skin is warm and dry.  Neurological:     General: No focal deficit present.     Mental Status: She is alert.  Psychiatric:        Mood and Affect: Mood normal.      Consultants:    Procedures:    Data Reviewed: No results found for this or any previous visit (from the past 24 hours).   I have reviewed pertinent nursing notes, vitals, labs, and images as necessary. I have ordered labwork to follow up on as indicated.  I have reviewed the last notes from staff over past 24 hours. I have discussed patient's care plan and test  results with nursing staff, CM/SW, and other staff as appropriate.    LOS: 4 days   Alm Apo, MD [facility] 11/02/2023, 12:14 PM

## 2023-11-03 DIAGNOSIS — R1310 Dysphagia, unspecified: Secondary | ICD-10-CM | POA: Diagnosis not present

## 2023-11-03 DIAGNOSIS — J9601 Acute respiratory failure with hypoxia: Secondary | ICD-10-CM | POA: Diagnosis not present

## 2023-11-03 DIAGNOSIS — J441 Chronic obstructive pulmonary disease with (acute) exacerbation: Secondary | ICD-10-CM | POA: Diagnosis not present

## 2023-11-03 MED ORDER — FUROSEMIDE 20 MG PO TABS
20.0000 mg | ORAL_TABLET | Freq: Every day | ORAL | Status: DC
  Administered 2023-11-04 – 2023-11-06 (×3): 20 mg via ORAL
  Filled 2023-11-03 (×3): qty 1

## 2023-11-03 NOTE — Progress Notes (Addendum)
 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 OF NOTE: Pt admitted w/ with acute onset of worsening dyspnea which has been significantly worsening with associated wheezing with no significant increased cough from her baseline with COPD.   Her residents had been recently sprayed with chemicals for bedbug treatment and afterwards, she developed worsening respiratory status with difficulty breathing. Per MD note: COPD exacerbation (HCC)- suspect precipitated from recent bedbug spraying at the house. CXR showed chronic hyperinflation with bronchial thickening.  Pt consumed po trials during session w/ no overt clinical s/s of aspiration; no overt oropharyngeal phase deficits noted.  Discussed general REFLUX precautions; foods/prep of consistencies for ease of Esophageal clearing.  Pt appears to be tolerating her current regular/mech soft consistency diet w/ thin liquids w/ no overt clinical s/s of aspiration nor decline in functional status w/ diet and oral intake per NSG/chart/MD notes.  Pt afebrile and WBC WNL this admit.  No further skilled ST services indicated at this time. Discussed w/ MD via secure chart the recommendation for pt to f/u w/ GI post Discharge for management of Esophageal phase Dysmotility and/or need for further assessment. MD agreed stating preference for pt f/u w/ EGD and GI post Discharge. Precautions/education posted in chart and given to pt.       HPI HPI: 78yo female admitted 10/29/23 with severe SOB. PMH: anxiety, depression, HTN, acute hypoxic respiratory failure,  COPD, CHF. CXR = no focal consolidation      SLP Plan  All goals met      Recommendations for follow up therapy are one component of a multi-disciplinary discharge planning process, led by the attending physician.  Recommendations may be updated based on patient status, additional functional criteria and insurance authorization.    Recommendations  Diet recommendations: Thin liquid;Regular;Dysphagia 3 (mechanical soft) (cut/chopped foods; moistened foods) Liquids provided via: Cup;Straw (less straw use recommended d/t air swallowed/reflux) Medication Administration: Whole meds with puree (for more conservative swallowing and clearing of the Esophagus) Supervision: Patient able to self feed (setup) Compensations: Minimize environmental distractions;Slow rate;Small sips/bites;Follow solids with liquid Postural Changes and/or Swallow Maneuvers: Out of bed for meals;Seated upright 90 degrees;Upright 30-60 min after meal (GERD/REFLUX precautions)                 (GI f/u as indicated; Dietician f/u) Oral care BID;Patient independent with oral care (setup)   PRN Dysphagia, unspecified (R13.10) (suspected Esophageal phase Dysmotility)     All goals met       Comer Portugal, MS, CCC-SLP Speech Language Pathologist Rehab Services; [facility] [place] [phone] (ascom) [place]  11/03/2023, 6:29 PM

## 2023-11-03 NOTE — Progress Notes (Signed)
 Progress Note    Ashley Brooks   FMW:[id number]  DOB: Jan 01, 1946  DOA: 10/29/2023     5 PCP: Glover Lenis, MD  Initial CC: SOB  Hospital Course: Ashley Brooks is a 78 y.o. Caucasian female with medical history significant for CHF, COPD, anxiety and hypertension, presented to the emergency room with acute onset of worsening dyspnea which has been significantly worsening with associated wheezing with no significant increased cough from her baseline with COPD.   Her residents had been recently sprayed with chemicals for bedbug treatment and afterwards, she developed worsening respiratory status with difficulty breathing. CXR showed chronic hyperinflation with bronchial thickening.  She was started on breathing treatments and steroids for presumed COPD exacerbation.  Interval History:  No events overnight.  Dysuria has resolved.  She endorses frequent urination from Lasix  and has been declining second dose.  She takes it daily at home.  Assessment and Plan: * COPD exacerbation (HCC) - Suspect precipitated from recent bedbug spraying at the house -If worsens or no significant improvement, will check COVID and RVP - Continue DuoNebs and steroids - Completed 5 days Rocephin    Acute respiratory failure with hypoxia (HCC) Not on oxygen at home -Continue O2 and wean as able -Some mild desaturation when ambulating with PT on 11/01/2023 down to 87%  Physical deconditioning - Resides at independent living and now with some deconditioning during hospitalization - PT/OT recommending SNF - Patient with slow improvement.  Likely will need SNF at this rate  Dysphagia - Appreciate SLP eval, performed 11/01/2023 - Patient okay for continuing on regular diet - Hold off on barium swallow; can be done outpatient if still deemed needed  Chronic systolic CHF (congestive heart failure) (HCC) - Continue digoxin  - Home meds on hold in setting of soft blood pressure  Dysuria-resolved as of  11/03/2023 - Patient started complaining of some dysuria on 10/31/2023 - UA noted with positive nitrite, trace LE, 6-10 WBC, rare bacteria; not fully consistent with infection but given patient being symptomatic, she is already on Rocephin  for COPD exacerbation and theoretically covered -Completed 5 days Rocephin  during hospitalization -Noted history of Pseudomonas on urine culture in December 2021  Anxiety and depression - continue Xanax  and Prozac .  Dyslipidemia - Continue statin   Old records reviewed in assessment of this patient  Antimicrobials: Rocephin  12/31 >> 11/02/2023  DVT prophylaxis:  enoxaparin  (LOVENOX ) injection 30 mg Start: 11/01/23 2200   Code Status:   Code Status: Full Code  Mobility Assessment (Last 72 Hours)     Mobility Assessment     Row Name 11/02/23 1957 11/02/23 1546 11/02/23 1100 11/01/23 2100 11/01/23 1206   Does patient have an order for bedrest or is patient medically unstable No - Continue assessment -- No - Continue assessment No - Continue assessment --   What is the highest level of mobility based on the progressive mobility assessment? Level 5 (Walks with assist in room/hall) - Balance while stepping forward/back and can walk in room with assist - Complete Level 4 (Walks with assist in room) - Balance while marching in place and cannot step forward and back - Complete Level 6 (Walks independently in room and hall) - Balance while walking in room without assist - Complete Level 5 (Walks with assist in room/hall) - Balance while stepping forward/back and can walk in room with assist - Complete Level 5 (Walks with assist in room/hall) - Balance while stepping forward/back and can walk in room with assist - Complete  Row Name 11/01/23 1110 11/01/23 1109 11/01/23 0930 10/31/23 2052 10/31/23 1525   Does patient have an order for bedrest or is patient medically unstable No - Continue assessment -- No - Continue assessment No - Continue assessment No -  Continue assessment   What is the highest level of mobility based on the progressive mobility assessment? Level 5 (Walks with assist in room/hall) - Balance while stepping forward/back and can walk in room with assist - Complete Level 4 (Walks with assist in room) - Balance while marching in place and cannot step forward and back - Complete Level 5 (Walks with assist in room/hall) - Balance while stepping forward/back and can walk in room with assist - Complete Level 4 (Walks with assist in room) - Balance while marching in place and cannot step forward and back - Complete Level 4 (Walks with assist in room) - Balance while marching in place and cannot step forward and back - Complete            Barriers to discharge: none Disposition Plan:  Home HH orders placed: none Status is: Inpt  Objective: Blood pressure (!) 118/59, pulse 70, temperature 98.7 F (37.1 C), temperature source Oral, resp. rate 18, height 5' (1.524 m), weight 40.8 kg, SpO2 97%.  Examination:  Physical Exam Constitutional:      Comments: Thin and anxious appearing  HENT:     Head: Normocephalic and atraumatic.     Mouth/Throat:     Mouth: Mucous membranes are moist.  Eyes:     Extraocular Movements: Extraocular movements intact.  Cardiovascular:     Rate and Rhythm: Normal rate and regular rhythm.  Pulmonary:     Effort: Pulmonary effort is normal. No respiratory distress.     Breath sounds: Normal breath sounds. No wheezing.  Abdominal:     General: Bowel sounds are normal. There is no distension.     Palpations: Abdomen is soft.     Tenderness: There is no abdominal tenderness.  Musculoskeletal:        General: Normal range of motion.     Cervical back: Normal range of motion and neck supple.  Skin:    General: Skin is warm and dry.  Neurological:     General: No focal deficit present.     Mental Status: She is alert.  Psychiatric:        Mood and Affect: Mood normal.      Consultants:     Procedures:    Data Reviewed: No results found for this or any previous visit (from the past 24 hours).   I have reviewed pertinent nursing notes, vitals, labs, and images as necessary. I have ordered labwork to follow up on as indicated.  I have reviewed the last notes from staff over past 24 hours. I have discussed patient's care plan and test results with nursing staff, CM/SW, and other staff as appropriate.    LOS: 5 days   Alm Apo, MD [facility] 11/03/2023, 10:59 AM

## 2023-11-03 NOTE — Progress Notes (Signed)
  Mobility Specialist - Progress Note     11/03/23 1846  Mobility  Activity Stood at bedside;Transferred from bed to chair;Dangled on edge of bed  Level of Assistance Standby assist, set-up cues, supervision of patient - no hands on  Assistive Device Front wheel walker  Mobility Referral Yes  Mobility visit 1 Mobility   Pt resting in bed on 2L upon entry. Pt STS and transferred to recliner SBA with RW. Pt left with needs in reach.   Guido Rumble Mobility Specialist 11/03/23, 6:50 PM

## 2023-11-04 DIAGNOSIS — J441 Chronic obstructive pulmonary disease with (acute) exacerbation: Secondary | ICD-10-CM | POA: Diagnosis not present

## 2023-11-04 MED ORDER — ALBUTEROL SULFATE (2.5 MG/3ML) 0.083% IN NEBU: 2.5000 mg | INHALATION_SOLUTION | RESPIRATORY_TRACT | Status: DC | PRN

## 2023-11-04 MED ORDER — ASPIRIN 81 MG PO CHEW
81.0000 mg | CHEWABLE_TABLET | Freq: Every day | ORAL | Status: DC
  Administered 2023-11-04 – 2023-11-06 (×3): 81 mg via ORAL
  Filled 2023-11-04 (×3): qty 1

## 2023-11-04 MED ORDER — ARFORMOTEROL TARTRATE 15 MCG/2ML IN NEBU
15.0000 ug | INHALATION_SOLUTION | Freq: Two times a day (BID) | RESPIRATORY_TRACT | Status: DC
  Administered 2023-11-04 – 2023-11-06 (×4): 15 ug via RESPIRATORY_TRACT
  Filled 2023-11-04 (×5): qty 2

## 2023-11-04 MED ORDER — UMECLIDINIUM BROMIDE 62.5 MCG/ACT IN AEPB
1.0000 | INHALATION_SPRAY | Freq: Every day | RESPIRATORY_TRACT | Status: DC
  Administered 2023-11-04 – 2023-11-06 (×3): 1 via RESPIRATORY_TRACT
  Filled 2023-11-04: qty 7

## 2023-11-04 MED ORDER — ATORVASTATIN CALCIUM 20 MG PO TABS
40.0000 mg | ORAL_TABLET | Freq: Every day | ORAL | Status: DC
  Administered 2023-11-04 – 2023-11-06 (×3): 40 mg via ORAL
  Filled 2023-11-04 (×3): qty 2

## 2023-11-04 NOTE — Progress Notes (Signed)
 Progress Note   Patient: Ashley Brooks FMW:[id number] DOB: Oct 04, 1946 DOA: 10/29/2023     6 DOS: the patient was seen and examined on 11/04/2023   Brief hospital course: 77yo with h/o chronic systolic CHF, COPD, anxiety, and HTN who presented on 12/31 with SOB, being treated for COPD exacerbation.  She is awaiting placement.  Assessment and Plan:  COPD exacerbation  Suspect this was precipitated from recent bedbug spraying at the house Completed 5 days Rocephin  and steroids Resume Stiolto Some mild desaturation when ambulating with PT on 11/01/2023 down to 87% She does not wear home O2 but now qualifies for home O2   Physical deconditioning Resides at independent living and now with some deconditioning during hospitalization PT/OT recommending SNF Awaiting placement   Dysphagia Appreciate SLP eval, performed 11/01/2023 Patient okay for continuing on regular diet Hold off on barium swallow; can be done outpatient if still deemed needed   Chronic systolic CHF (congestive heart failure)  Continue digoxin  Home meds (losartan , Toprol  XL) on hold in setting of soft blood pressure Continue ASA   Anxiety and depression Continue Xanax  and Prozac    Dyslipidemia Continue statin  Abnormal mammogram Calcifications noted on 12/17 imaging She was supposed to go for further testing the day PTA  but was too ill She understands the importance of follow up about this issue      Consultants: PT OT SLP TOC team  Procedures: None  Antibiotics: Ceftraaxone x 5 days  30 Day Unplanned Readmission Risk Score    Flowsheet Row ED to Hosp-Admission (Current) from 10/29/2023 in [facility]  30 Day Unplanned Readmission Risk Score (%) 13.56 Filed at 11/04/2023 0401       This score is the patient's risk of an unplanned readmission within 30 days of being discharged (0 -100%). The score is based on dignosis, age, lab data, medications, orders, and  past utilization.   Low:  0-14.9   Medium: 15-21.9   High: 22-29.9   Extreme: 30 and above           Subjective: Feeling ok, awaiting placement.   Objective: Vitals:   11/04/23 0622 11/04/23 0835  BP: (!) 108/53 116/66  Pulse: 67 71  Resp: 17 20  Temp:  98.5 F (36.9 C)  SpO2: 100% 100%   No intake or output data in the 24 hours ending 11/04/23 1508 Filed Weights   10/29/23 1948  Weight: 40.8 kg    Exam:  General:  Appears calm and comfortable and is in NAD, on  [place] O2 Eyes:  EOMI, normal lids, iris ENT:  grossly normal hearing, lips & tongue, mmm Neck:  no LAD, masses or thyromegaly Cardiovascular:  RRR, no m/r/g. No LE edema.  Respiratory:   CTA bilaterally with no wheezes/rales/rhonchi.  Normal respiratory effort. Abdomen:  soft, NT, ND Skin:  no rash or induration seen on limited exam Musculoskeletal:  grossly normal tone BUE/BLE, good ROM, no bony abnormality Psychiatric:  grossly normal mood and affect, speech fluent and appropriate, AOx3 Neurologic:  CN 2-12 grossly intact, moves all extremities in coordinated fashion, sensation intact  Data Reviewed: I have reviewed the patient's lab results since admission.  Pertinent labs for today include:    None    Family Communication: None present  Disposition: Status is: Inpatient Remains inpatient appropriate because: awaiting placement     Time spent: 35 minutes  Unresulted Labs (From admission, onward)     Start     Ordered  11/05/23 0500  CBC with Differential/Platelet  Tomorrow morning,   R        11/04/23 1508   11/05/23 0500  Basic metabolic panel  Tomorrow morning,   R        11/04/23 1508             Author: Delon Herald, MD 11/04/2023 3:08 PM  For on call review [email or web address].

## 2023-11-04 NOTE — Plan of Care (Signed)

## 2023-11-04 NOTE — Progress Notes (Signed)
 Physical Therapy Treatment Patient Details Name: Ashley Brooks MRN: [id number] DOB: [birth year] Today's Date: 11/04/2023   History of Present Illness Pt is a 78 yo female with medical history significant for CHF, COPD, anxiety and hypertension, presented to the emergency room with acute onset of worsening dyspnea which has been significantly worsening with associated wheezing with no significant increased cough from her baseline with COPD, admitted for potential COPD exacerbation.    PT Comments  Pt was pleasant and motivated to participate during the session and put forth good effort throughout. Pt found on 1LO2/min with nursing requesting O2 qualification test with results as follows:  1LO2/min at rest: SpO2 98%/HR 90bpm, room air at rest: SpO2 95%/HR 93, room air after amb 15 feet: SpO2 86%/ HR 114, 1LO2 after amb 15 feet: SpO2 95%/ HR 105.  Pt reported no adverse symptoms during the session other than min SOB after amb that resolved after short therapeutic rest breaks in sitting with cues for PLB.  Of note pt with some noted bleeding through her R distal sock at the end of the session with nursing notified attending to patient. Pt will benefit from continued PT services upon discharge to safely address deficits listed in patient problem list for decreased caregiver assistance and eventual return to PLOF.       If plan is discharge home, recommend the following: A little help with walking and/or transfers;A little help with bathing/dressing/bathroom;Assistance with cooking/housework;Assist for transportation;Help with stairs or ramp for entrance   Can travel by private vehicle     Yes  Equipment Recommendations  Other (comment) (TBD at next venue of care)    Recommendations for Other Services       Precautions / Restrictions Precautions Precautions: Fall Restrictions Weight Bearing Restrictions Per Provider Order: No     Mobility  Bed Mobility               General bed  mobility comments: NT pt in recliner at start/end of session    Transfers Overall transfer level: Needs assistance Equipment used: Rolling walker (2 wheels)   Sit to Stand: Contact guard assist           General transfer comment: Min increased time and effort to come to standing but no physical assist needed this session.    Ambulation/Gait Ambulation/Gait assistance: Contact guard assist Gait Distance (Feet): 15 Feet Assistive device: Rolling walker (2 wheels) Gait Pattern/deviations: Step-through pattern, Decreased step length - right, Decreased step length - left, Trunk flexed Gait velocity: decreased     General Gait Details: Slow cadence with short B step length but steady with no overt LOB   Stairs             Wheelchair Mobility     Tilt Bed    Modified Rankin (Stroke Patients Only)       Balance           Standing balance support: Bilateral upper extremity supported, During functional activity, Reliant on assistive device for balance Standing balance-Leahy Scale: Good                              Cognition Arousal: Alert Behavior During Therapy: WFL for tasks assessed/performed Overall Cognitive Status: Within Functional Limits for tasks assessed  General Comments: pt is HOH but A and O x 4        Exercises      General Comments        Pertinent Vitals/Pain Pain Assessment Pain Assessment: No/denies pain    Home Living                          Prior Function            PT Goals (current goals can now be found in the care plan section) Progress towards PT goals: Progressing toward goals    Frequency    Min 1X/week      PT Plan      Co-evaluation              AM-PAC PT 6 Clicks Mobility   Outcome Measure  Help needed turning from your back to your side while in a flat bed without using bedrails?: A Little Help needed moving from lying  on your back to sitting on the side of a flat bed without using bedrails?: A Little Help needed moving to and from a bed to a chair (including a wheelchair)?: A Little Help needed standing up from a chair using your arms (e.g., wheelchair or bedside chair)?: A Little Help needed to walk in hospital room?: A Little Help needed climbing 3-5 steps with a railing? : A Lot 6 Click Score: 17    End of Session Equipment Utilized During Treatment: Oxygen;Gait belt Activity Tolerance: Patient tolerated treatment well Patient left: in chair;with call bell/phone within reach Nurse Communication: Mobility status;Other (comment) (chair alarm not functioning, needs batteries) PT Visit Diagnosis: Other abnormalities of gait and mobility (R26.89);Difficulty in walking, not elsewhere classified (R26.2);Muscle weakness (generalized) (M62.81)     Time: 8662-8595 PT Time Calculation (min) (ACUTE ONLY): 27 min  Charges:    $Gait Training: 8-22 mins $Therapeutic Activity: 8-22 mins PT General Charges $$ ACUTE PT VISIT: 1 Visit                     D. Scott Aza Dantes PT, DPT 11/04/23, 2:52 PM

## 2023-11-04 NOTE — TOC Progression Note (Signed)
 Transition of Care [facility]) - Progression Note    Patient Details  Name: Ashley Brooks MRN: [id number] Date of Birth: 19-May-1946  [facility]) CM/SW Contact  Lauraine JAYSON Carpen, LCSW Phone Number: 11/04/2023, 3:24 PM  Clinical Narrative:   No bed offers at this time. CSW sent therapy notes to facilities that have not responded yet.  Expected Discharge Plan and Services                                               Social Determinants of Health (SDOH) Interventions SDOH Screenings   Food Insecurity: No Food Insecurity (10/30/2023)  Housing: Low Risk  (11/02/2023)  Transportation Needs: No Transportation Needs (10/30/2023)  Utilities: Not At Risk (10/30/2023)  Social Connections: Unknown (10/31/2023)  Tobacco Use: Medium Risk (11/02/2023)    Readmission Risk Interventions     No data to display

## 2023-11-05 DIAGNOSIS — J441 Chronic obstructive pulmonary disease with (acute) exacerbation: Secondary | ICD-10-CM | POA: Diagnosis not present

## 2023-11-05 LAB — CBC WITH DIFFERENTIAL/PLATELET
Abs Immature Granulocytes: 0.02 10*3/uL (ref 0.00–0.07)
Basophils Absolute: 0 10*3/uL (ref 0.0–0.1)
Basophils Relative: 0 %
Eosinophils Absolute: 0 10*3/uL (ref 0.0–0.5)
Eosinophils Relative: 1 %
HCT: 32.8 % — ABNORMAL LOW (ref 36.0–46.0)
Hemoglobin: 11.1 g/dL — ABNORMAL LOW (ref 12.0–15.0)
Immature Granulocytes: 0 %
Lymphocytes Relative: 16 %
Lymphs Abs: 0.8 10*3/uL (ref 0.7–4.0)
MCH: 32.7 pg (ref 26.0–34.0)
MCHC: 33.8 g/dL (ref 30.0–36.0)
MCV: 96.8 fL (ref 80.0–100.0)
Monocytes Absolute: 0.4 10*3/uL (ref 0.1–1.0)
Monocytes Relative: 9 %
Neutro Abs: 3.8 10*3/uL (ref 1.7–7.7)
Neutrophils Relative %: 74 %
Platelets: 141 10*3/uL — ABNORMAL LOW (ref 150–400)
RBC: 3.39 MIL/uL — ABNORMAL LOW (ref 3.87–5.11)
RDW: 12 % (ref 11.5–15.5)
WBC: 5 10*3/uL (ref 4.0–10.5)
nRBC: 0 % (ref 0.0–0.2)

## 2023-11-05 LAB — BASIC METABOLIC PANEL
Anion gap: 9 (ref 5–15)
BUN: 24 mg/dL — ABNORMAL HIGH (ref 8–23)
CO2: 29 mmol/L (ref 22–32)
Calcium: 8.4 mg/dL — ABNORMAL LOW (ref 8.9–10.3)
Chloride: 99 mmol/L (ref 98–111)
Creatinine, Ser: 0.6 mg/dL (ref 0.44–1.00)
GFR, Estimated: >60 mL/min (ref >60)
Glucose, Bld: 95 mg/dL (ref 70–99)
Potassium: 3.4 mmol/L — ABNORMAL LOW (ref 3.5–5.1)
Sodium: 137 mmol/L (ref 135–145)

## 2023-11-05 MED ORDER — ENSURE ENLIVE PO LIQD
237.0000 mL | Freq: Two times a day (BID) | ORAL | Status: DC
  Administered 2023-11-05 – 2023-11-06 (×2): 237 mL via ORAL

## 2023-11-05 MED ORDER — POTASSIUM CHLORIDE CRYS ER 20 MEQ PO TBCR
40.0000 meq | EXTENDED_RELEASE_TABLET | Freq: Once | ORAL | Status: AC
  Administered 2023-11-05: 40 meq via ORAL
  Filled 2023-11-05: qty 2

## 2023-11-05 MED ORDER — ADULT MULTIVITAMIN W/MINERALS CH
1.0000 | ORAL_TABLET | Freq: Every day | ORAL | Status: DC
  Administered 2023-11-06: 1 via ORAL
  Filled 2023-11-05: qty 1

## 2023-11-05 NOTE — Progress Notes (Signed)
 Progress Note   Patient: Ashley Brooks FMW:[id number] DOB: 16-Apr-1946 DOA: 10/29/2023     7 DOS: the patient was seen and examined on 11/05/2023   Brief hospital course: 77yo with h/o chronic systolic CHF, COPD, anxiety, and HTN who presented on 12/31 with SOB, being treated for COPD exacerbation.  She is awaiting placement.  Assessment and Plan:  COPD exacerbation  Suspect this was precipitated from recent bedbug spraying at the house Completed 5 days Rocephin  and steroids Resume Stiolto Some mild desaturation when ambulating with PT on 11/01/2023 down to 87% She does not wear home O2 but now qualifies for home O2    Physical deconditioning Resides at independent living and now with some deconditioning during hospitalization PT/OT recommending SNF Awaiting placement - has been approved and has insurance authorization to go on 1/8   Dysphagia Appreciate SLP eval, performed 11/01/2023 Patient okay for continuing on regular diet Hold off on barium swallow; can be done outpatient if still deemed needed   Chronic systolic CHF (congestive heart failure)  Continue digoxin  Home meds (losartan , Toprol  XL) on hold in setting of soft blood pressure Continue ASA   Anxiety and depression Continue Xanax  and Prozac    Dyslipidemia Continue statin   Abnormal mammogram Calcifications noted on 12/17 imaging She was supposed to go for further testing the day PTA  but was too ill She understands the importance of follow up about this issue         Consultants: PT OT SLP TOC team   Procedures: None   Antibiotics: Ceftraxone x 5 days   30 Day Unplanned Readmission Risk Score    Flowsheet Row ED to Hosp-Admission (Current) from 10/29/2023 in [facility]  30 Day Unplanned Readmission Risk Score (%) 14.22 Filed at 11/05/2023 0401       This score is the patient's risk of an unplanned readmission within 30 days of being discharged (0 -100%).  The score is based on dignosis, age, lab data, medications, orders, and past utilization.   Low:  0-14.9   Medium: 15-21.9   High: 22-29.9   Extreme: 30 and above           Subjective: Feeling good, just wants to know the plan.   Objective: Vitals:   11/05/23 0345 11/05/23 0758  BP: (!) 114/59 (!) 109/53  Pulse: 73 84  Resp: 18 16  Temp: 98.7 F (37.1 C) 98.9 F (37.2 C)  SpO2: 99% 94%    Intake/Output Summary (Last 24 hours) at 11/05/2023 1441 Last data filed at 11/05/2023 1033 Gross per 24 hour  Intake 120 ml  Output 200 ml  Net -80 ml   Filed Weights   10/29/23 1948  Weight: 40.8 kg    Exam:  General:  Appears calm and comfortable and is in NAD, on  [place] O2 Eyes:  EOMI, normal lids, iris ENT:  grossly normal hearing, lips & tongue, mmm Neck:  no LAD, masses or thyromegaly Cardiovascular:  RRR, no m/r/g. No LE edema.  Respiratory:   CTA bilaterally with no wheezes/rales/rhonchi.  Normal respiratory effort. Abdomen:  soft, NT, ND Skin:  no rash or induration seen on limited exam Musculoskeletal:  grossly normal tone BUE/BLE, good ROM, no bony abnormality Psychiatric:  grossly normal mood and affect, speech fluent and appropriate, AOx3 Neurologic:  CN 2-12 grossly intact, moves all extremities in coordinated fashion, sensation intact  Data Reviewed: I have reviewed the patient's lab results since admission.  Pertinent labs for today  include:   K+ 3.4 Stable CBC     Family Communication: None listed - patient refused  Disposition: Status is: Inpatient Remains inpatient appropriate because: awaiting placement     Time spent: 35 minutes  Unresulted Labs (From admission, onward)    None        Author: Delon Herald, MD 11/05/2023 2:41 PM  For on call review [email or web address].

## 2023-11-05 NOTE — Progress Notes (Signed)
 Occupational Therapy Treatment Patient Details Name: Ashley Brooks MRN: [id number] DOB: Oct 24, 1946 Today's Date: 11/05/2023   History of present illness Pt is a 78 yo female with medical history significant for CHF, COPD, anxiety and hypertension, presented to the emergency room with acute onset of worsening dyspnea which has been significantly worsening with associated wheezing with no significant increased cough from her baseline with COPD, admitted for potential COPD exacerbation.   OT comments  Ms. Hurlbut was seen for OT treatment on this date. Upon arrival to room pt seated in room recliner, agreeable to OT Tx session. OT facilitated ADL management with education and assistance as described below. See ADL section for additional details regarding occupational performance. Pt continues to be functionally limited by generalized weakness, decreased cardiopulmonary status, and decreased activity tolerance. Pt return verbalizes understanding of education provided t/o session. Pt is progressing toward OT goals and continues to benefit from skilled OT services to maximize return to PLOF and minimize risk of future falls, injury, caregiver burden, and readmission. Will continue to follow POC as written. Discharge recommendation remains appropriate.        If plan is discharge home, recommend the following:  A little help with walking and/or transfers;Assist for transportation;Assistance with cooking/housework;A lot of help with bathing/dressing/bathroom   Equipment Recommendations  BSC/3in1    Recommendations for Other Services      Precautions / Restrictions Precautions Precautions: Fall Restrictions Weight Bearing Restrictions Per Provider Order: No       Mobility Bed Mobility               General bed mobility comments: NT pt in recliner at start/end of session    Transfers Overall transfer level: Needs assistance Equipment used: Rolling walker (2 wheels) Transfers: Sit  to/from Stand Sit to Stand: Supervision           General transfer comment: Min increased time and effort to come to standing but no physical assist needed this session. Supervision for safety with amb in hospital room.     Balance Overall balance assessment: Needs assistance Sitting-balance support: Feet supported, No upper extremity supported Sitting balance-Leahy Scale: Good     Standing balance support: During functional activity, No upper extremity supported Standing balance-Leahy Scale: Good Standing balance comment: pt states she uses bariatric rollator at baseline. No LOB with RW use                           ADL either performed or assessed with clinical judgement   ADL Overall ADL's : Needs assistance/impaired     Grooming: Standing Grooming Details (indicate cue type and reason): Simulated standing grooming task at sink to practice energy conservation techniques. Pt declines engaging in actual functional activity this date. Return demos understanding of activity pacing, work simplification, and pursed lipped breathing techniques.                 Toilet Transfer: Set up;Supervision/safety;Rolling walker (2 wheels) Toilet Transfer Details (indicate cue type and reason): simulated to/from recliner with good recall of safety education from prior session.         Functional mobility during ADLs: Supervision/safety;Set up;Rolling walker (2 wheels) General ADL Comments: Improved activity tolerance from time of initial OT evaluation. Able to perform functional mobility of HH distance with supervision for safety and RW. Educated on ECS as described above and O2 safety for home as pt likely to DC with home O2 per chart. Return verbalizes  understanding of education provided.    Extremity/Trunk Assessment Upper Extremity Assessment Upper Extremity Assessment: Generalized weakness   Lower Extremity Assessment Lower Extremity Assessment: Generalized weakness         Vision Baseline Vision/History: 6 Macular Degeneration;1 Wears glasses Ability to See in Adequate Light: 3 Highly impaired Patient Visual Report: No change from baseline     Perception     Praxis      Cognition Arousal: Alert Behavior During Therapy: WFL for tasks assessed/performed Overall Cognitive Status: Within Functional Limits for tasks assessed                                          Exercises Other Exercises Other Exercises: OT facilitated ADL training with education and assistance as described above. See ADL section for additional detail.    Shoulder Instructions       General Comments VS remain WFL t/o session. HR noted to increase from mid 80's to 110's with functional activity. Pt on 1.5 L [place] t/o session.SpO2 remains >/= 90% with functional activity.    Pertinent Vitals/ Pain       Pain Assessment Pain Assessment: No/denies pain Faces Pain Scale: No hurt  Home Living                                          Prior Functioning/Environment              Frequency  Min 1X/week        Progress Toward Goals  OT Goals(current goals can now be found in the care plan section)  Progress towards OT goals: Progressing toward goals  Acute Rehab OT Goals Patient Stated Goal: to feel better/go home OT Goal Formulation: With patient Time For Goal Achievement: 11/15/23 Potential to Achieve Goals: Good  Plan      Co-evaluation                 AM-PAC OT 6 Clicks Daily Activity     Outcome Measure   Help from another person eating meals?: None Help from another person taking care of personal grooming?: A Little Help from another person toileting, which includes using toliet, bedpan, or urinal?: A Little Help from another person bathing (including washing, rinsing, drying)?: A Little Help from another person to put on and taking off regular upper body clothing?: A Little Help from another person to put  on and taking off regular lower body clothing?: A Little 6 Click Score: 19    End of Session Equipment Utilized During Treatment: Gait belt;Rolling walker (2 wheels)  OT Visit Diagnosis: Other abnormalities of gait and mobility (R26.89);Muscle weakness (generalized) (M62.81)   Activity Tolerance Patient tolerated treatment well   Patient Left in chair;with call bell/phone within reach;with chair alarm set   Nurse Communication          Time: 8676-8660 OT Time Calculation (min): 16 min  Charges: OT General Charges $OT Visit: 1 Visit OT Treatments $Self Care/Home Management : 8-22 mins  Jhonny Pelton, M.S., OTR/L 11/05/23, 2:43 PM

## 2023-11-05 NOTE — TOC Progression Note (Addendum)
 Transition of Care [facility]) - Progression Note    Patient Details  Name: Ashley Brooks MRN: [id number] Date of Birth: 1946-09-24  [facility]) CM/SW Contact  Corean ONEIDA Haddock, RN Phone Number: 11/05/2023, 11:23 AM  Clinical Narrative:      Met with patient at bedside.  Presented bed offer.  Patient accepts bed at [facility].  Accepted bed in [facility] and notified Tanya at [facility].  Message sent to MD to determine when it is anticipated patient will medically be ready in order to start auth     Update:  Per MD patient medically stable for auth to be started.  Jon with TOC to start auth   Expected Discharge Plan and Services                                               Social Determinants of Health (SDOH) Interventions SDOH Screenings   Food Insecurity: No Food Insecurity (10/30/2023)  Housing: Low Risk  (11/02/2023)  Transportation Needs: No Transportation Needs (10/30/2023)  Utilities: Not At Risk (10/30/2023)  Social Connections: Unknown (10/31/2023)  Tobacco Use: Medium Risk (11/02/2023)    Readmission Risk Interventions     No data to display

## 2023-11-05 NOTE — Plan of Care (Signed)

## 2023-11-06 DIAGNOSIS — J441 Chronic obstructive pulmonary disease with (acute) exacerbation: Secondary | ICD-10-CM | POA: Diagnosis not present

## 2023-11-06 MED ORDER — FUROSEMIDE 20 MG PO TABS: 20.0000 mg | ORAL_TABLET | Freq: Every day | ORAL | Status: AC

## 2023-11-06 NOTE — Plan of Care (Signed)

## 2023-11-06 NOTE — Discharge Summary (Signed)
 Physician Discharge Summary  [name]:[id number] DOB: 1946-09-23 DOA: 10/29/2023  PCP: Glover Lenis, MD  Admit date: 10/29/2023 Discharge date: 11/06/2023  Admitted From: home  Disposition:  SNF  Recommendations for Outpatient Follow-up:  Follow up with PCP in 1 week   Home Health: no  Equipment/Devices:   Discharge Condition: stable  CODE STATUS: full  Diet recommendation:  Regular   Brief/Interim Summary: HPI was taken from Dr. Lawence: Ashley Brooks is a 78 y.o. Caucasian female with medical history significant for CHF, COPD, anxiety and hypertension, presented to the emergency room with acute onset of worsening dyspnea which has been significantly worsening since yesterday with associated wheezing with no significant increased cough from her baseline with COPD.  She reported that she saw bedbugs in her home and therefore had her house sprayed yesterday.  She feels she may have had exposure to irritants.  She denied any fever or chills.  No chest pain or palpitations.  No nausea or vomiting or abdominal pain.  No dysuria, oliguria or hematuria or flank pain.   ED Course: When she came to the ER, respiratory rate was 29 with pulse oximetry of 100% on 6 L and later on 2 L of O2 by nasal cannula.  Prior to that the patient was on nonrebreather by EMS.  Vital signs were otherwise within normal.  Labs revealed blood glucose of 167 and calcium  8.6 with otherwise unremarkable CMP.  CBC showed mild leukocytosis of 11.1. EKG as reviewed by me : EKG showed normal sinus rhythm with rate of 93 with left axis deviation and Q waves anteroseptally. Imaging: Portable chest x-ray showed chronic hyperinflation and bronchial thickening with no acute findings.   The patient was given 4 DuoNebs and 2 g of IV magnesium  sulfate as well as 125 mg of IV Solu-Medrol .  She will be admitted to a medical telemetry bed for further evaluation and management.  Discharge Diagnoses:  Principal  Problem:   COPD exacerbation (HCC) Active Problems:   Acute respiratory failure with hypoxia (HCC)   Chronic systolic CHF (congestive heart failure) (HCC)   Dysphagia   Physical deconditioning   Dyslipidemia   Anxiety and depression  COPD exacerbation: completed abx and steroid course inpatient. Continue w/ bronchodilators & encourage incentive spirometry   Acute hypoxic respiratory failure: likely secondary to COPD. Continue on supplemental oxygen and unable to be weaned from supplemental oxygen currently    Physical deconditioning: PT/OT recs SNF    Dysphagia: continue on regular diet as per speech    Chronic systolic CHF: continue on digoxin . Holding home metoprolol , losartan  secondary low normal BP. Can f/u w/ PCP to determine when and if to start these meds back    Anxiety: severity unknown. Continue on home dose of xanax   Depression: severity unknown. Continue on home dose of prozac    HLD: continue on statin    Abnormal mammogram: calcifications noted on 12/17 imaging. Pt was supposed to go for further testing the day PTA  but was too ill & pt understands the importance of follow up about this issue  Discharge Instructions  Discharge Instructions     Diet general   Complete by: As directed    Discharge instructions   Complete by: As directed    F/u w/ PCP in 1 week.   Increase activity slowly   Complete by: As directed       Allergies as of 11/06/2023   No Known Allergies      Medication List  PAUSE taking these medications    losartan  25 MG tablet Wait to take this until your doctor or other care provider tells you to start again. Commonly known as: COZAAR  Take 0.5 tablets (12.5 mg total) by mouth daily.   metoprolol  succinate 25 MG 24 hr tablet Wait to take this until your doctor or other care provider tells you to start again. Commonly known as: TOPROL -XL Take 0.5 tablets (12.5 mg total) by mouth daily.       TAKE these medications     acetaminophen  325 MG tablet Commonly known as: TYLENOL  Take 2 tablets (650 mg total) by mouth every 4 (four) hours as needed for headache or mild pain.   aspirin  81 MG chewable tablet Chew 1 tablet (81 mg total) by mouth daily.   atorvastatin  40 MG tablet Commonly known as: LIPITOR Take 1 tablet (40 mg total) by mouth daily.   digoxin  0.125 MG tablet Commonly known as: LANOXIN  Take 1 tablet (0.125 mg total) by mouth daily.   feeding supplement Liqd Take 237 mLs by mouth 2 (two) times daily between meals.   FLUoxetine  40 MG capsule Commonly known as: PROZAC  Take 40 mg by mouth daily.   furosemide  20 MG tablet Commonly known as: LASIX  Take 1 tablet (20 mg total) by mouth daily. Start taking on: November 07, 2023 What changed:  when to take this additional instructions   ipratropium-albuterol  0.5-2.5 (3) MG/3ML Soln Commonly known as: DUONEB Take 3 mLs by nebulization every 4 (four) hours as needed.   melatonin 3 MG Tabs tablet Take 3 mg by mouth at bedtime.   senna-docusate 8.6-50 MG tablet Commonly known as: Senokot-S Take 1 tablet by mouth 2 (two) times daily as needed for mild constipation.   Stiolto Respimat 2.5-2.5 MCG/ACT Aers Generic drug: Tiotropium Bromide-Olodaterol Inhale 2 puffs into the lungs daily.   Ventolin  HFA 108 (90 Base) MCG/ACT inhaler Generic drug: albuterol  Inhale 2 puffs into the lungs every 4 (four) hours as needed for wheezing.        Contact information for follow-up providers     Glover Lenis, MD Follow up.   Specialty: Family Medicine Why: F/u in 1 week Contact information: [address] [place] [address] [phone]              Contact information for after-discharge care     Destination     [facility] .   Service: Skilled Nursing Contact information: [address] [place]  [address] [phone]                    No Known  Allergies  Consultations:    Procedures/Studies: DG Chest Port 1 View Result Date: 10/31/2023 CLINICAL DATA:  [id number] Acute on chronic respiratory failure with hypoxia (HCC) [id number] EXAM: PORTABLE CHEST 1 VIEW COMPARISON:  Chest x-ray 10/29/2023 FINDINGS: The heart and mediastinal contours are unchanged. Aortic calcification. Hyperinflation of the lungs. No focal consolidation. Chronic coarsened interstitial markings with no overt pulmonary edema. Nonspecific left costophrenic angle blunting. Possible trace left pleural effusion. No pneumothorax. No acute osseous abnormality. IMPRESSION: 1. Nonspecific left costophrenic angle blunting. Possible trace left pleural effusion. 2. Aortic Atherosclerosis (ICD10-I70.0) and Emphysema (ICD10-J43.9). Electronically Signed   By: Morgane  Naveau M.D.   On: 10/31/2023 03:08   DG Chest Port 1 View Result Date: 10/29/2023 CLINICAL DATA:  Shortness of breath. EXAM: PORTABLE CHEST 1 VIEW COMPARISON:  12/02/2022, lung cancer screening CT 02/05/2023 FINDINGS: Chronic hyperinflation. Mild edematous changes  on prior CT are not well seen by radiograph. There is mild chronic bronchial thickening. No focal airspace disease. The heart is normal in size. Aortic atherosclerosis. No pleural fluid or pneumothorax. IMPRESSION: Chronic hyperinflation and bronchial thickening. No acute findings. Electronically Signed   By: Andrea Gasman M.D.   On: 10/29/2023 21:51   MM 3D SCREENING MAMMOGRAM BILATERAL BREAST Result Date: 10/17/2023 CLINICAL DATA:  Screening. EXAM: DIGITAL SCREENING BILATERAL MAMMOGRAM WITH TOMOSYNTHESIS AND CAD TECHNIQUE: Bilateral screening digital craniocaudal and mediolateral oblique mammograms were obtained. Bilateral screening digital breast tomosynthesis was performed. The images were evaluated with computer-aided detection. COMPARISON:  Previous exam(s). ACR Breast Density Category c: The breasts are heterogeneously dense, which may obscure small masses.  FINDINGS: In the right breast, calcifications warrant further evaluation with magnified views. In the left breast, no findings suspicious for malignancy. IMPRESSION: Further evaluation is suggested for calcifications in the right breast. RECOMMENDATION: Diagnostic mammogram of the right breast. (Code:[id number]) The patient will be contacted regarding the findings, and additional imaging will be scheduled. BI-RADS CATEGORY  0: Incomplete: Need additional imaging evaluation. Electronically Signed   By: Delon Music M.D.   On: 10/17/2023 09:22   (Echo, Carotid, EGD, Colonoscopy, ERCP)    Subjective: Pt c/o fatigue    Discharge Exam: Vitals:   11/06/23 0421 11/06/23 0807  BP: (!) 108/50 (!) 103/39  Pulse: 69 72  Resp: 18 16  Temp: 98.5 F (36.9 C) 98.1 F (36.7 C)  SpO2: 98% 100%   Vitals:   11/05/23 1932 11/06/23 0421 11/06/23 0500 11/06/23 0807  BP:  (!) 108/50  (!) 103/39  Pulse:  69  72  Resp:  18  16  Temp:  98.5 F (36.9 C)  98.1 F (36.7 C)  TempSrc:  Oral  Oral  SpO2: 98% 98%  100%  Weight:   39 kg   Height:        General: Pt is alert, awake, not in acute distress Cardiovascular: S1/S2 +, no rubs, no gallops Respiratory: CTA bilaterally, no wheezing, no rhonchi Abdominal: Soft, NT, ND, bowel sounds + Extremities: no edema, no cyanosis    The results of significant diagnostics from this hospitalization (including imaging, microbiology, ancillary and laboratory) are listed below for reference.     Microbiology: No results found for this or any previous visit (from the past 240 hours).   Labs: BNP (last 3 results) Recent Labs    12/03/22 0218 10/29/23 1942 10/31/23 0505  BNP 133.8* 98.8 328.2*   Basic Metabolic Panel: Recent Labs  Lab 11/05/23 0444  NA 137  K 3.4*  CL 99  CO2 29  GLUCOSE 95  BUN 24*  CREATININE 0.60  CALCIUM  8.4*   Liver Function Tests: No results for input(s): AST, ALT, ALKPHOS, BILITOT, PROT, ALBUMIN in the last  168 hours. No results for input(s): LIPASE, AMYLASE in the last 168 hours. No results for input(s): AMMONIA in the last 168 hours. CBC: Recent Labs  Lab 11/05/23 0444  WBC 5.0  NEUTROABS 3.8  HGB 11.1*  HCT 32.8*  MCV 96.8  PLT 141*   Cardiac Enzymes: No results for input(s): CKTOTAL, CKMB, CKMBINDEX, TROPONINI in the last 168 hours. BNP: Invalid input(s): POCBNP CBG: No results for input(s): GLUCAP in the last 168 hours. D-Dimer No results for input(s): DDIMER in the last 72 hours. Hgb A1c No results for input(s): HGBA1C in the last 72 hours. Lipid Profile No results for input(s): CHOL, HDL, LDLCALC, TRIG, CHOLHDL, LDLDIRECT in the last 72  hours. Thyroid function studies No results for input(s): TSH, T4TOTAL, T3FREE, THYROIDAB in the last 72 hours.  Invalid input(s): FREET3 Anemia work up No results for input(s): VITAMINB12, FOLATE, FERRITIN, TIBC, IRON, RETICCTPCT in the last 72 hours. Urinalysis    Component Value Date/Time   COLORURINE YELLOW (A) 10/31/2023 1137   APPEARANCEUR CLEAR (A) 10/31/2023 1137   LABSPEC 1.013 10/31/2023 1137   PHURINE 5.0 10/31/2023 1137   GLUCOSEU NEGATIVE 10/31/2023 1137   HGBUR SMALL (A) 10/31/2023 1137   BILIRUBINUR NEGATIVE 10/31/2023 1137   KETONESUR NEGATIVE 10/31/2023 1137   PROTEINUR NEGATIVE 10/31/2023 1137   NITRITE POSITIVE (A) 10/31/2023 1137   LEUKOCYTESUR TRACE (A) 10/31/2023 1137   Sepsis Labs Recent Labs  Lab 11/05/23 0444  WBC 5.0   Microbiology No results found for this or any previous visit (from the past 240 hours).   Time coordinating discharge: Over 30 minutes  SIGNED:   Anthony CHRISTELLA Pouch, MD  [facility] 11/06/2023, 3:14 PM Pager   If 7PM-7AM, please contact night-coverage [email or web address]

## 2023-11-06 NOTE — TOC Transition Note (Signed)
 Transition of Care [facility]) - Discharge Note   Patient Details  Name: Ashley Brooks MRN: [id number] Date of Birth: October 20, 1946  [facility] CM/SW Contact:  Corean ONEIDA Haddock, RN Phone Number: 11/06/2023, 3:32 PM   Clinical Narrative:        Patient will DC to: [facility]  Anticipated DC date: 11/06/23   Transport ab:[id number]  Per MD patient ready for DC to . RN, patient,  and facility notified of DC. Discharge Summary sent to facility. RN given number for report. DC packet on chart. Ambulance transport requested for patient.  TOC signing off.      Patient Goals and CMS Choice            Discharge Placement                       Discharge Plan and Services Additional resources added to the After Visit Summary for                                       Social Drivers of Health (SDOH) Interventions SDOH Screenings   Food Insecurity: No Food Insecurity (10/30/2023)  Housing: Low Risk  (11/02/2023)  Transportation Needs: No Transportation Needs (10/30/2023)  Utilities: Not At Risk (10/30/2023)  Social Connections: Moderately Integrated (11/06/2023)  Tobacco Use: Medium Risk (11/02/2023)     Readmission Risk Interventions     No data to display

## 2023-11-06 NOTE — Progress Notes (Signed)
 This nurse attempted to call [facility] to provide report on patient prior to discharge but had 2 unsuccessful phone calls. Patient aware of transport pending, verbalized understanding. IV removed.

## 2023-11-06 NOTE — Progress Notes (Signed)
 Physical Therapy Treatment Patient Details Name: Ashley Brooks MRN: [id number] DOB: Sep 02, 1946 Today's Date: 11/06/2023   History of Present Illness Pt is a 78 yo female with medical history significant for CHF, COPD, anxiety and hypertension, presented to the emergency room with acute onset of worsening dyspnea which has been significantly worsening with associated wheezing with no significant increased cough from her baseline with COPD, admitted for potential COPD exacerbation.    PT Comments  Pt was pleasant and motivated to participate during the session and put forth good effort throughout. Pt found on room air with SpO2 99% and HR 92 bpm.  Pt able to amb 20 feet on room air with min verbal cues for safe sequencing with the RW with SpO2 dropping to a low of 87% and HR 114 bpm.  Pt placed on 1LO2/min and after a therapeutic rest break was able to amb 20 feet a second time with SpO2 95% and HR 118 bpm.  Pt left on 1LO2/min at end of session, nursing notified.  Pt will benefit from continued PT services upon discharge to safely address deficits listed in patient problem list for decreased caregiver assistance and eventual return to PLOF.       If plan is discharge home, recommend the following: A little help with walking and/or transfers;A little help with bathing/dressing/bathroom;Assistance with cooking/housework;Assist for transportation;Help with stairs or ramp for entrance   Can travel by private vehicle     Yes  Equipment Recommendations  Other (comment) (TBD at next venue of care)    Recommendations for Other Services       Precautions / Restrictions Restrictions Weight Bearing Restrictions Per Provider Order: No     Mobility  Bed Mobility               General bed mobility comments: NT pt in recliner at start/end of session    Transfers Overall transfer level: Needs assistance Equipment used: Rolling walker (2 wheels) Transfers: Sit to/from Stand Sit to Stand:  Supervision           General transfer comment: Min increased time and effort to come to standing but no physical assist needed this session    Ambulation/Gait Ambulation/Gait assistance: Supervision Gait Distance (Feet): 20 Feet Assistive device: Rolling walker (2 wheels) Gait Pattern/deviations: Step-through pattern, Decreased step length - right, Decreased step length - left, Trunk flexed Gait velocity: decreased     General Gait Details: Slow cadence with short B step length but steady with no overt LOB   Stairs             Wheelchair Mobility     Tilt Bed    Modified Rankin (Stroke Patients Only)       Balance Overall balance assessment: Needs assistance Sitting-balance support: Feet supported, No upper extremity supported Sitting balance-Leahy Scale: Good     Standing balance support: During functional activity, Bilateral upper extremity supported Standing balance-Leahy Scale: Good                              Cognition Arousal: Alert Behavior During Therapy: WFL for tasks assessed/performed Overall Cognitive Status: Within Functional Limits for tasks assessed                                 General Comments: pt is HOH but A and O x 4  Exercises Total Joint Exercises Towel Squeeze: Strengthening, Both, 10 reps Long Arc Quad: Strengthening, Both, 10 reps Knee Flexion: Strengthening, Both, 10 reps Marching in Standing: Strengthening, Both, 10 reps, Seated    General Comments        Pertinent Vitals/Pain Pain Assessment Pain Assessment: No/denies pain    Home Living                          Prior Function            PT Goals (current goals can now be found in the care plan section) Progress towards PT goals: Progressing toward goals    Frequency    Min 1X/week      PT Plan      Co-evaluation              AM-PAC PT 6 Clicks Mobility   Outcome Measure  Help needed  turning from your back to your side while in a flat bed without using bedrails?: A Little Help needed moving from lying on your back to sitting on the side of a flat bed without using bedrails?: A Little Help needed moving to and from a bed to a chair (including a wheelchair)?: A Little Help needed standing up from a chair using your arms (e.g., wheelchair or bedside chair)?: A Little Help needed to walk in hospital room?: A Little Help needed climbing 3-5 steps with a railing? : A Lot 6 Click Score: 17    End of Session Equipment Utilized During Treatment: Gait belt;Oxygen Activity Tolerance: Patient tolerated treatment well Patient left: in chair;with call bell/phone within reach Nurse Communication: Mobility status;Other (comment) (Chair alarm needs batteries) PT Visit Diagnosis: Other abnormalities of gait and mobility (R26.89);Difficulty in walking, not elsewhere classified (R26.2);Muscle weakness (generalized) (M62.81)     Time: 8943-8882 PT Time Calculation (min) (ACUTE ONLY): 21 min  Charges:    $Gait Training: 8-22 mins PT General Charges $$ ACUTE PT VISIT: 1 Visit                     D. Scott Lirio Bach PT, DPT 11/06/23, 12:08 PM

## 2024-02-17 ENCOUNTER — Other Ambulatory Visit: Payer: Self-pay | Admitting: Acute Care

## 2024-02-17 DIAGNOSIS — Z87891 Personal history of nicotine dependence: Secondary | ICD-10-CM

## 2024-02-17 DIAGNOSIS — Z122 Encounter for screening for malignant neoplasm of respiratory organs: Secondary | ICD-10-CM

## 2024-03-05 ENCOUNTER — Ambulatory Visit
Admission: RE | Admit: 2024-03-05 | Discharge: 2024-03-05 | Disposition: A | Discharge status: Home / Self Care | Source: Ambulatory Visit | Attending: Acute Care | Admitting: Acute Care

## 2024-03-05 DIAGNOSIS — Z87891 Personal history of nicotine dependence: Secondary | ICD-10-CM | POA: Diagnosis present

## 2024-03-05 DIAGNOSIS — Z122 Encounter for screening for malignant neoplasm of respiratory organs: Secondary | ICD-10-CM | POA: Insufficient documentation

## 2024-03-31 ENCOUNTER — Other Ambulatory Visit: Payer: Self-pay

## 2024-05-31 ENCOUNTER — Other Ambulatory Visit: Payer: Self-pay

## 2024-05-31 ENCOUNTER — Emergency Department

## 2024-05-31 ENCOUNTER — Inpatient Hospital Stay
Admission: EM | Admit: 2024-05-31 | Discharge: 2024-06-02 | DRG: 191 | Disposition: A | Discharge status: Home / Self Care | Attending: Internal Medicine | Admitting: Internal Medicine

## 2024-05-31 DIAGNOSIS — R0602 Shortness of breath: Secondary | ICD-10-CM

## 2024-05-31 DIAGNOSIS — E785 Hyperlipidemia, unspecified: Secondary | ICD-10-CM | POA: Diagnosis present

## 2024-05-31 DIAGNOSIS — Z87891 Personal history of nicotine dependence: Secondary | ICD-10-CM

## 2024-05-31 DIAGNOSIS — F419 Anxiety disorder, unspecified: Secondary | ICD-10-CM | POA: Diagnosis present

## 2024-05-31 DIAGNOSIS — R64 Cachexia: Secondary | ICD-10-CM | POA: Diagnosis present

## 2024-05-31 DIAGNOSIS — I428 Other cardiomyopathies: Secondary | ICD-10-CM | POA: Diagnosis present

## 2024-05-31 DIAGNOSIS — I252 Old myocardial infarction: Secondary | ICD-10-CM

## 2024-05-31 DIAGNOSIS — J9611 Chronic respiratory failure with hypoxia: Secondary | ICD-10-CM | POA: Diagnosis present

## 2024-05-31 DIAGNOSIS — I2510 Atherosclerotic heart disease of native coronary artery without angina pectoris: Secondary | ICD-10-CM | POA: Diagnosis present

## 2024-05-31 DIAGNOSIS — Z7982 Long term (current) use of aspirin: Secondary | ICD-10-CM

## 2024-05-31 DIAGNOSIS — J441 Chronic obstructive pulmonary disease with (acute) exacerbation: Principal | ICD-10-CM | POA: Diagnosis present

## 2024-05-31 DIAGNOSIS — R54 Age-related physical debility: Secondary | ICD-10-CM | POA: Diagnosis present

## 2024-05-31 DIAGNOSIS — Z681 Body mass index (BMI) 19 or less, adult: Secondary | ICD-10-CM | POA: Diagnosis not present

## 2024-05-31 DIAGNOSIS — Z7951 Long term (current) use of inhaled steroids: Secondary | ICD-10-CM

## 2024-05-31 DIAGNOSIS — I5032 Chronic diastolic (congestive) heart failure: Secondary | ICD-10-CM | POA: Diagnosis present

## 2024-05-31 DIAGNOSIS — J439 Emphysema, unspecified: Secondary | ICD-10-CM | POA: Diagnosis present

## 2024-05-31 DIAGNOSIS — Z79899 Other long term (current) drug therapy: Secondary | ICD-10-CM | POA: Diagnosis not present

## 2024-05-31 DIAGNOSIS — I110 Hypertensive heart disease with heart failure: Secondary | ICD-10-CM | POA: Diagnosis present

## 2024-05-31 DIAGNOSIS — Z800 Family history of malignant neoplasm of digestive organs: Secondary | ICD-10-CM | POA: Diagnosis not present

## 2024-05-31 DIAGNOSIS — R636 Underweight: Secondary | ICD-10-CM | POA: Diagnosis present

## 2024-05-31 LAB — CBC WITH DIFFERENTIAL/PLATELET
Abs Immature Granulocytes: 0.03 K/uL (ref 0.00–0.07)
Basophils Absolute: 0 K/uL (ref 0.0–0.1)
Basophils Relative: 0 %
Eosinophils Absolute: 0 K/uL (ref 0.0–0.5)
Eosinophils Relative: 0 %
HCT: 35.4 % — ABNORMAL LOW (ref 36.0–46.0)
Hemoglobin: 11.9 g/dL — ABNORMAL LOW (ref 12.0–15.0)
Immature Granulocytes: 0 %
Lymphocytes Relative: 8 %
Lymphs Abs: 0.7 K/uL (ref 0.7–4.0)
MCH: 32.4 pg (ref 26.0–34.0)
MCHC: 33.6 g/dL (ref 30.0–36.0)
MCV: 96.5 fL (ref 80.0–100.0)
Monocytes Absolute: 0.3 K/uL (ref 0.1–1.0)
Monocytes Relative: 4 %
Neutro Abs: 7.2 K/uL (ref 1.7–7.7)
Neutrophils Relative %: 88 %
Platelets: 167 K/uL (ref 150–400)
RBC: 3.67 MIL/uL — ABNORMAL LOW (ref 3.87–5.11)
RDW: 12.7 % (ref 11.5–15.5)
WBC: 8.2 K/uL (ref 4.0–10.5)
nRBC: 0 % (ref 0.0–0.2)

## 2024-05-31 LAB — RESPIRATORY PANEL BY PCR

## 2024-05-31 LAB — CREATININE, SERUM
Creatinine, Ser: 0.58 mg/dL (ref 0.44–1.00)
GFR, Estimated: >60 mL/min (ref >60)

## 2024-05-31 LAB — COMPREHENSIVE METABOLIC PANEL WITH GFR
ALT: 17 U/L (ref 0–44)
AST: 21 U/L (ref 15–41)
Albumin: 3.5 g/dL (ref 3.5–5.0)
Alkaline Phosphatase: 63 U/L (ref 38–126)
Anion gap: 7 (ref 5–15)
BUN: 23 mg/dL (ref 8–23)
CO2: 26 mmol/L (ref 22–32)
Calcium: 8.5 mg/dL — ABNORMAL LOW (ref 8.9–10.3)
Chloride: 106 mmol/L (ref 98–111)
Creatinine, Ser: 0.53 mg/dL (ref 0.44–1.00)
GFR, Estimated: >60 mL/min (ref >60)
Glucose, Bld: 186 mg/dL — ABNORMAL HIGH (ref 70–99)
Potassium: 3.8 mmol/L (ref 3.5–5.1)
Sodium: 139 mmol/L (ref 135–145)
Total Bilirubin: 0.7 mg/dL (ref 0.0–1.2)
Total Protein: 6.2 g/dL — ABNORMAL LOW (ref 6.5–8.1)

## 2024-05-31 LAB — CBC
HCT: 36.9 % (ref 36.0–46.0)
Hemoglobin: 12.7 g/dL (ref 12.0–15.0)
MCH: 32.9 pg (ref 26.0–34.0)
MCHC: 34.4 g/dL (ref 30.0–36.0)
MCV: 95.6 fL (ref 80.0–100.0)
Platelets: 146 K/uL — ABNORMAL LOW (ref 150–400)
RBC: 3.86 MIL/uL — ABNORMAL LOW (ref 3.87–5.11)
RDW: 12.8 % (ref 11.5–15.5)
WBC: 8.3 K/uL (ref 4.0–10.5)
nRBC: 0 % (ref 0.0–0.2)

## 2024-05-31 LAB — TSH: TSH: 0.753 u[IU]/mL (ref 0.350–4.500)

## 2024-05-31 LAB — TROPONIN I (HIGH SENSITIVITY)
Troponin I (High Sensitivity): 5 ng/L (ref <18)
Troponin I (High Sensitivity): 7 ng/L (ref <18)

## 2024-05-31 LAB — BRAIN NATRIURETIC PEPTIDE: B Natriuretic Peptide: 58.4 pg/mL (ref 0.0–100.0)

## 2024-05-31 LAB — MAGNESIUM: Magnesium: 4.8 mg/dL — ABNORMAL HIGH (ref 1.7–2.4)

## 2024-05-31 MED ORDER — MELATONIN 5 MG PO TABS
5.0000 mg | ORAL_TABLET | Freq: Every day | ORAL | Status: DC
  Administered 2024-05-31 – 2024-06-01 (×2): 5 mg via ORAL
  Filled 2024-05-31 (×2): qty 1

## 2024-05-31 MED ORDER — FLUTICASONE FUROATE-VILANTEROL 100-25 MCG/ACT IN AEPB
1.0000 | INHALATION_SPRAY | Freq: Every day | RESPIRATORY_TRACT | Status: DC
  Administered 2024-05-31 – 2024-06-01 (×2): 1 via RESPIRATORY_TRACT
  Filled 2024-05-31: qty 28

## 2024-05-31 MED ORDER — ONDANSETRON HCL 4 MG PO TABS: 4.0000 mg | ORAL_TABLET | Freq: Four times a day (QID) | ORAL | Status: DC | PRN

## 2024-05-31 MED ORDER — ACETAMINOPHEN 325 MG PO TABS
650.0000 mg | ORAL_TABLET | Freq: Four times a day (QID) | ORAL | Status: DC | PRN
Start: 2024-05-31 — End: 2024-06-02

## 2024-05-31 MED ORDER — HEPARIN SODIUM (PORCINE) 5000 UNIT/ML IJ SOLN
5000.0000 [IU] | Freq: Three times a day (TID) | INTRAMUSCULAR | Status: DC
  Administered 2024-05-31 – 2024-06-02 (×7): 5000 [IU] via SUBCUTANEOUS
  Filled 2024-05-31 (×7): qty 1

## 2024-05-31 MED ORDER — ENSURE ENLIVE PO LIQD
237.0000 mL | Freq: Two times a day (BID) | ORAL | Status: DC
  Administered 2024-06-01 – 2024-06-02 (×4): 237 mL via ORAL
  Filled 2024-05-31: qty 237

## 2024-05-31 MED ORDER — METHYLPREDNISOLONE SODIUM SUCC 40 MG IJ SOLR
40.0000 mg | Freq: Two times a day (BID) | INTRAMUSCULAR | Status: AC
  Administered 2024-05-31 (×2): 40 mg via INTRAVENOUS
  Filled 2024-05-31 (×2): qty 1

## 2024-05-31 MED ORDER — ACETAMINOPHEN 650 MG RE SUPP: 650.0000 mg | Freq: Four times a day (QID) | RECTAL | Status: DC | PRN

## 2024-05-31 MED ORDER — IPRATROPIUM-ALBUTEROL 0.5-2.5 (3) MG/3ML IN SOLN
3.0000 mL | Freq: Four times a day (QID) | RESPIRATORY_TRACT | Status: DC
  Administered 2024-05-31 – 2024-06-01 (×5): 3 mL via RESPIRATORY_TRACT
  Filled 2024-05-31 (×5): qty 3

## 2024-05-31 MED ORDER — ALBUTEROL SULFATE (2.5 MG/3ML) 0.083% IN NEBU
2.5000 mg | INHALATION_SOLUTION | RESPIRATORY_TRACT | Status: DC | PRN
  Administered 2024-05-31 – 2024-06-02 (×2): 2.5 mg via RESPIRATORY_TRACT
  Filled 2024-05-31 (×2): qty 3

## 2024-05-31 MED ORDER — FLUOXETINE HCL 20 MG PO CAPS
40.0000 mg | ORAL_CAPSULE | Freq: Every day | ORAL | Status: DC
  Administered 2024-05-31 – 2024-06-02 (×3): 40 mg via ORAL
  Filled 2024-05-31 (×3): qty 2

## 2024-05-31 MED ORDER — IPRATROPIUM-ALBUTEROL 0.5-2.5 (3) MG/3ML IN SOLN
6.0000 mL | Freq: Once | RESPIRATORY_TRACT | Status: AC
  Administered 2024-05-31: 6 mL via RESPIRATORY_TRACT
  Filled 2024-05-31: qty 6

## 2024-05-31 MED ORDER — PREDNISONE 20 MG PO TABS
40.0000 mg | ORAL_TABLET | Freq: Every day | ORAL | Status: DC
  Administered 2024-06-01 – 2024-06-02 (×2): 40 mg via ORAL
  Filled 2024-05-31 (×2): qty 2

## 2024-05-31 MED ORDER — FUROSEMIDE 20 MG PO TABS
20.0000 mg | ORAL_TABLET | Freq: Every day | ORAL | Status: DC
  Administered 2024-05-31 – 2024-06-02 (×3): 20 mg via ORAL
  Filled 2024-05-31 (×3): qty 1

## 2024-05-31 MED ORDER — ONDANSETRON HCL 4 MG/2ML IJ SOLN: 4.0000 mg | Freq: Four times a day (QID) | INTRAMUSCULAR | Status: DC | PRN

## 2024-05-31 MED ORDER — ATORVASTATIN CALCIUM 20 MG PO TABS
40.0000 mg | ORAL_TABLET | Freq: Every day | ORAL | Status: DC
Start: 2024-05-31 — End: 2024-06-02
  Administered 2024-05-31 – 2024-06-01 (×2): 40 mg via ORAL
  Filled 2024-05-31 (×2): qty 2

## 2024-05-31 MED ORDER — DOXYCYCLINE HYCLATE 100 MG PO TABS
100.0000 mg | ORAL_TABLET | Freq: Two times a day (BID) | ORAL | Status: DC
  Administered 2024-05-31 – 2024-06-02 (×5): 100 mg via ORAL
  Filled 2024-05-31 (×5): qty 1

## 2024-05-31 MED ORDER — SENNOSIDES-DOCUSATE SODIUM 8.6-50 MG PO TABS: 1.0000 | ORAL_TABLET | Freq: Two times a day (BID) | ORAL | Status: DC | PRN

## 2024-05-31 MED ORDER — ASPIRIN 81 MG PO CHEW
81.0000 mg | CHEWABLE_TABLET | Freq: Every day | ORAL | Status: DC
  Administered 2024-05-31 – 2024-06-02 (×3): 81 mg via ORAL
  Filled 2024-05-31 (×3): qty 1

## 2024-05-31 NOTE — ED Triage Notes (Signed)
 COPD & CHF, last episode was 1 year ago. difficulty breathing started last night. albuterol  inhaler 3 times since last night. Patient feels tight in chest. 1 douneb with EMS. Mag hung with EMS. EMS states she is not working as hard as she was this morning. Patient usually wears 2L at night but was not wearing it when EMS got there. Pt hard of hearing.   Patient AOX44 CBG: 123

## 2024-05-31 NOTE — ED Provider Notes (Signed)
 [facility] Provider Note    Event Date/Time   First MD Initiated Contact with Patient 05/31/24 [phone]     (approximate)   History   Shortness of Breath   HPI  Ashley Brooks is a 78 y.o. female who presents to the ED for evaluation of Shortness of Breath   I review a [facility] cardiology clinic visit from February.  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  Patient presents to the ED from home via EMS for shortness of breath since last night.  EMS provide steroids, DuoNeb with improvement of clinical picture.  Here in the ED she reports shortness of breath without chest pain   Physical Exam   Triage Vital Signs: ED Triage Vitals [05/31/24 0846]  Encounter Vitals Group     BP      Girls Systolic BP Percentile      Girls Diastolic BP Percentile      Boys Systolic BP Percentile      Boys Diastolic BP Percentile      Pulse      Resp      Temp      Temp src      SpO2      Weight      Height      Head Circumference      Peak Flow      Pain Score 0     Pain Loc      Pain Education      Exclude from Growth Chart     Most recent vital signs: Vitals:   05/31/24 0849  BP: 119/78  Pulse: 73  Resp: 20  Temp: 98 F (36.7 C)  SpO2: 100%    General: Awake, no distress.  Frail and thin, pink puffer morphology CV:  Good peripheral perfusion.  Resp:  Mild tachypnea to the low/mid 20s.  Very poor airflow with nearly silent breath sounds.  Faint wheezing Abd:  No distention.  MSK:  No deformity noted.  Neuro:  No focal deficits appreciated. Other:     ED Results / Procedures / Treatments   Labs (all labs ordered are listed, but only abnormal results are displayed) Labs Reviewed  COMPREHENSIVE METABOLIC PANEL WITH GFR - Abnormal; Notable for the following components:      Result Value   Glucose, Bld 186 (*)    Calcium  8.5 (*)    Total Protein 6.2 (*)    All other components  within normal limits  CBC WITH DIFFERENTIAL/PLATELET - Abnormal; Notable for the following components:   RBC 3.67 (*)    Hemoglobin 11.9 (*)    HCT 35.4 (*)    All other components within normal limits  MAGNESIUM  - Abnormal; Notable for the following components:   Magnesium  4.8 (*)    All other components within normal limits  RESPIRATORY PANEL BY PCR  EXPECTORATED SPUTUM ASSESSMENT W GRAM STAIN, RFLX TO RESP C  BRAIN NATRIURETIC PEPTIDE  CBC  CREATININE, SERUM  TSH  HEMOGLOBIN A1C  TROPONIN I (HIGH SENSITIVITY)  TROPONIN I (HIGH SENSITIVITY)    EKG Sinus rhythm with a rate of 75 bpm.  Normal axis and intervals without clear signs of acute ischemia.  RADIOLOGY CXR interpreted by me without evidence of acute cardiopulmonary pathology.  Official radiology report(s): DG Chest Portable 1 View Result Date: 05/31/2024 EXAM: 1 VIEW XRAY OF THE CHEST 05/31/2024 09:02:00 AM COMPARISON: 10/31/2023 CLINICAL HISTORY: COPD exacerbation. Table formatting from the  original note was not included.; COPD \\T \ CHF, last episode was 1 year ago. difficulty breathing started last night. albuterol  inhaler 3 times since last night. Patient feels tight in chest. 1 douneb with EMS. Mag hung with EMS. EMS states she is not working as hard as she was this morning. Patient usually wears 2L at night but was not wearing it when EMS got there. FINDINGS: LUNGS AND PLEURA: The lungs are hyperinflated with coarsened interstitial markings and diffuse bronchial wall thickening compatible with COPD/emphysema. No focal pulmonary opacity. No pulmonary edema. No pleural effusion. No pneumothorax. HEART AND MEDIASTINUM: No acute abnormality of the cardiac and mediastinal silhouettes. Aortic atherosclerotic calcification. BONES AND SOFT TISSUES: No acute osseous abnormality. IMPRESSION: 1. No acute abnormality. 2. Hyperinflated lungs with coarsened interstitial markings and diffuse bronchial wall thickening, compatible with  COPD/emphysema. Electronically signed by: Waddell Calk MD 05/31/2024 09:08 AM EDT RP Workstation: [id number]    PROCEDURES and INTERVENTIONS:  .1-3 Lead EKG Interpretation  Performed by: Claudene Rover, MD Authorized by: Claudene Rover, MD     Interpretation: normal     ECG rate:  74   ECG rate assessment: normal     Rhythm: sinus rhythm     Ectopy: none     Conduction: normal     Medications  doxycycline  (VIBRA -TABS) tablet 100 mg (has no administration in time range)  methylPREDNISolone  sodium succinate (SOLU-MEDROL ) 40 mg/mL injection 40 mg (has no administration in time range)    Followed by  predniSONE  (DELTASONE ) tablet 40 mg (has no administration in time range)  ipratropium-albuterol  (DUONEB) 0.5-2.5 (3) MG/3ML nebulizer solution 3 mL (has no administration in time range)  albuterol  (PROVENTIL ) (2.5 MG/3ML) 0.083% nebulizer solution 2.5 mg (has no administration in time range)  heparin  injection 5,000 Units (has no administration in time range)  ondansetron  (ZOFRAN ) tablet 4 mg (has no administration in time range)    Or  ondansetron  (ZOFRAN ) injection 4 mg (has no administration in time range)  acetaminophen  (TYLENOL ) tablet 650 mg (has no administration in time range)    Or  acetaminophen  (TYLENOL ) suppository 650 mg (has no administration in time range)  ipratropium-albuterol  (DUONEB) 0.5-2.5 (3) MG/3ML nebulizer solution 6 mL (6 mLs Nebulization Given 05/31/24 0858)  ipratropium-albuterol  (DUONEB) 0.5-2.5 (3) MG/3ML nebulizer solution 6 mL (6 mLs Nebulization Given 05/31/24 0956)     IMPRESSION / MDM / ASSESSMENT AND PLAN / ED COURSE  I reviewed the triage vital signs and the nursing notes.  Differential diagnosis includes, but is not limited to, ACS, PTX, PNA, muscle strain/spasm, PE, dissection, anxiety, pleural effusion  {Patient presents with symptoms of an acute illness or injury that is potentially life-threatening.  Patient presents with evidence of a COPD  exacerbation requiring medical admission.  No hypoxia or distress.  No indications for BiPAP.  No clear signs of volume overload.  Clear CXR, low BNP.  Low troponin with normal electrolytes and WBC.  Despite multiple rounds of nebulizers, prehospital steroids she is still quite symptomatic and moving very little air.  Consult with medicine for admission.      FINAL CLINICAL IMPRESSION(S) / ED DIAGNOSES   Final diagnoses:  COPD exacerbation (HCC)  Shortness of breath     Rx / DC Orders   ED Discharge Orders     None        Note:  This document was prepared using Dragon voice recognition software and may include unintentional dictation errors.   Claudene Rover, MD 05/31/24 [phone]

## 2024-05-31 NOTE — ED Notes (Signed)
 Patient provided with specimen cup for Sputum sample. Education provided to patient on how to provide sample.

## 2024-05-31 NOTE — H&P (Addendum)
 History and Physical    Ashley Brooks FMW:[id number] DOB: April 25, 1946 DOA: 05/31/2024  PCP: Glover Lenis, MD  Patient coming from: home  I have personally briefly reviewed patient's old medical records in [facility]  Chief Complaint: sob   HPI: Ashley Brooks is a 78 y.o. female with medical history significant of COPD  CHF ef 20-25%, CAD s/p NSTEMI, HLD , Anxiety, who presents to ED BIB EMS for  complaint of sob x 1 week worse over the last 24 hours. Patient notes no new cough , fever/chills/n/v/chest pain, or sick contacts.  She notes compliance with her medications.   ED Course:  Afeb,bp 119/78, hr 73, sat 100%  Labs: wbc 8.2, hgg 11.9, plt 167  Na 139, K 3.8, cl 106, glu 186,  Mag 4.8 Bnp58.4 EKG:NSR RAD Cxr:  IMPRESSION: 1. No acute abnormality. 2. Hyperinflated lungs with coarse interstitial markings and diffuse bronchial wall thickening, compatible with COPD/emphysema.   Tx solumedrol, doxycline,dounebs Review of Systems: As per HPI otherwise 10 point review of systems negative.   Past Medical History:  Diagnosis Date   Acute hypoxic respiratory failure (HCC) 12/04/2022   Anxiety    CHF (congestive heart failure) (HCC)    COPD (chronic obstructive pulmonary disease) (HCC)    COPD exacerbation (HCC) 08/25/2018   Elevated troponin 12/03/2022   HLD (hyperlipidemia)    Hypertension    Hypokalemia 09/30/2020   Personal history of tobacco use, presenting hazards to health 12/06/2015    Past Surgical History:  Procedure Laterality Date   CATARACT EXTRACTION     LEFT HEART CATH AND CORONARY ANGIOGRAPHY Right 12/03/2022   Procedure: LEFT HEART CATH AND CORONARY ANGIOGRAPHY with possible intervention;  Surgeon: Fernand Denyse LABOR, MD;  Location: [facility] INVASIVE CV LAB;  Service: Cardiovascular;  Laterality: Right;   right foot fusion     TUBAL LIGATION       reports that she quit smoking about 6 years ago. Her smoking use included cigarettes. She started  smoking about 48 years ago. She has a 42 pack-year smoking history. She has never used smokeless tobacco. She reports current alcohol use of about 2.0 standard drinks of alcohol per week. She reports that she does not use drugs.  No Known Allergies  Family History  Problem Relation Age of Onset   Colon cancer Mother    Breast cancer Neg Hx     Prior to Admission medications   Medication Sig Start Date End Date Taking? Authorizing Provider  acetaminophen  (TYLENOL ) 325 MG tablet Take 2 tablets (650 mg total) by mouth every 4 (four) hours as needed for headache or mild pain. 12/11/22  Yes Fausto Sor A, DO  aspirin  81 MG chewable tablet Chew 1 tablet (81 mg total) by mouth daily. 12/12/22  Yes Fausto Sor A, DO  atorvastatin  (LIPITOR) 40 MG tablet Take 1 tablet (40 mg total) by mouth daily. 12/12/22  Yes Fausto Sor A, DO  FLUoxetine  (PROZAC ) 40 MG capsule Take 40 mg by mouth daily.    Yes [provider]  fluticasone -salmeterol (ADVAIR) 100-50 MCG/ACT AEPB Inhale 1 puff into the lungs 2 (two) times daily.   Yes [provider]  furosemide  (LASIX ) 20 MG tablet Take 1 tablet (20 mg total) by mouth daily. 11/07/23  Yes Trudy Anthony CHRISTELLA, MD  ipratropium-albuterol  (DUONEB) 0.5-2.5 (3) MG/3ML SOLN Take 3 mLs by nebulization every 4 (four) hours as needed. 12/11/22  Yes Fausto Sor A, DO  melatonin 3 MG TABS tablet Take 3  mg by mouth at bedtime.   Yes [provider]  senna-docusate (SENOKOT-S) 8.6-50 MG tablet Take 1 tablet by mouth 2 (two) times daily as needed for mild constipation. 12/11/22  Yes Fausto Burnard LABOR, DO  VENTOLIN  HFA 108 (90 Base) MCG/ACT inhaler Inhale 2 puffs into the lungs every 4 (four) hours as needed for wheezing. 06/03/18  Yes [provider]  digoxin  (LANOXIN ) 0.125 MG tablet Take 1 tablet (0.125 mg total) by mouth daily. Patient not taking: Reported on 05/31/2024 12/12/22   Fausto Burnard A, DO  feeding supplement (ENSURE ENLIVE /  ENSURE PLUS) LIQD Take 237 mLs by mouth 2 (two) times daily between meals. 12/11/22   Fausto Burnard LABOR, DO  losartan  (COZAAR ) 25 MG tablet Take 0.5 tablets (12.5 mg total) by mouth daily. Patient not taking: Reported on 05/31/2024 12/12/22   Fausto Burnard A, DO  metoprolol  succinate (TOPROL -XL) 25 MG 24 hr tablet Take 0.5 tablets (12.5 mg total) by mouth daily. Patient not taking: Reported on 05/31/2024 12/12/22   Fausto Burnard A, DO  STIOLTO RESPIMAT 2.5-2.5 MCG/ACT AERS Inhale 2 puffs into the lungs daily. Patient not taking: Reported on 05/31/2024 09/23/20   [provider]    Physical Exam: Vitals:   05/31/24 0849 05/31/24 1200 05/31/24 1330 05/31/24 1500  BP: 119/78 119/65 102/63 107/72  Pulse: 73 97 95 96  Resp: 20 (!) 24 20 18   Temp: 98 F (36.7 C)     TempSrc: Oral     SpO2: 100% 94% 92% 96%  Weight:      Height:        Constitutional: NAD, calm, comfortable Vitals:   05/31/24 0849 05/31/24 1200 05/31/24 1330 05/31/24 1500  BP: 119/78 119/65 102/63 107/72  Pulse: 73 97 95 96  Resp: 20 (!) 24 20 18   Temp: 98 F (36.7 C)     TempSrc: Oral     SpO2: 100% 94% 92% 96%  Weight:      Height:       Eyes: PERRL, lids and conjunctivae normal ENMT: Mucous membranes are moist. Posterior pharynx clear of any exudate or lesions.Normal dentition.  Neck: normal, supple, no masses, no thyromegaly Respiratory: decrease air movement + wheezing, no crackles. Normal respiratory effort. No accessory muscle use.  Cardiovascular: Regular rate and rhythm, no murmurs / rubs / gallops. No extremity edema. 2+ pedal pulses.  Abdomen: no tenderness, no masses palpated. No hepatosplenomegaly. Bowel sounds positive.  Musculoskeletal: no clubbing / cyanosis. No joint deformity upper and lower extremities. Good ROM, no contractures. Normal muscle tone.  Skin: no rashes, lesions, ulcers. No induration Neurologic: CN grossly intact. Sensation intact,  Strength 5/5 in all 4.  Psychiatric:  Normal judgment and insight. Alert and oriented x 3. Normal mood.    Labs on Admission: I have personally reviewed following labs and imaging studies  CBC: Recent Labs  Lab 05/31/24 0851 05/31/24 1124  WBC 8.2 8.3  NEUTROABS 7.2  --   HGB 11.9* 12.7  HCT 35.4* 36.9  MCV 96.5 95.6  PLT 167 146*   Basic Metabolic Panel: Recent Labs  Lab 05/31/24 0851 05/31/24 1124  NA 139  --   K 3.8  --   CL 106  --   CO2 26  --   GLUCOSE 186*  --   BUN 23  --   CREATININE 0.53 0.58  CALCIUM  8.5*  --   MG 4.8*  --    GFR: Estimated Creatinine Clearance: 31.2 mL/min (by C-G formula  based on SCr of 0.58 mg/dL). Liver Function Tests: Recent Labs  Lab 05/31/24 0851  AST 21  ALT 17  ALKPHOS 63  BILITOT 0.7  PROT 6.2*  ALBUMIN 3.5   No results for input(s): LIPASE, AMYLASE in the last 168 hours. No results for input(s): AMMONIA in the last 168 hours. Coagulation Profile: No results for input(s): INR, PROTIME in the last 168 hours. Cardiac Enzymes: No results for input(s): CKTOTAL, CKMB, CKMBINDEX, TROPONINI in the last 168 hours. BNP (last 3 results) No results for input(s): PROBNP in the last 8760 hours. HbA1C: No results for input(s): HGBA1C in the last 72 hours. CBG: No results for input(s): GLUCAP in the last 168 hours. Lipid Profile: No results for input(s): CHOL, HDL, LDLCALC, TRIG, CHOLHDL, LDLDIRECT in the last 72 hours. Thyroid Function Tests: Recent Labs    05/31/24 1124  TSH 0.753   Anemia Panel: No results for input(s): VITAMINB12, FOLATE, FERRITIN, TIBC, IRON, RETICCTPCT in the last 72 hours. Urine analysis:    Component Value Date/Time   COLORURINE YELLOW (A) 10/31/2023 1137   APPEARANCEUR CLEAR (A) 10/31/2023 1137   LABSPEC 1.013 10/31/2023 1137   PHURINE 5.0 10/31/2023 1137   GLUCOSEU NEGATIVE 10/31/2023 1137   HGBUR SMALL (A) 10/31/2023 1137   BILIRUBINUR NEGATIVE 10/31/2023 1137   KETONESUR NEGATIVE  10/31/2023 1137   PROTEINUR NEGATIVE 10/31/2023 1137   NITRITE POSITIVE (A) 10/31/2023 1137   LEUKOCYTESUR TRACE (A) 10/31/2023 1137    Radiological Exams on Admission: DG Chest Portable 1 View Result Date: 05/31/2024 EXAM: 1 VIEW XRAY OF THE CHEST 05/31/2024 09:02:00 AM COMPARISON: 10/31/2023 CLINICAL HISTORY: COPD exacerbation. Table formatting from the original note was not included.; COPD \\T \ CHF, last episode was 1 year ago. difficulty breathing started last night. albuterol  inhaler 3 times since last night. Patient feels tight in chest. 1 douneb with EMS. Mag hung with EMS. EMS states she is not working as hard as she was this morning. Patient usually wears 2L at night but was not wearing it when EMS got there. FINDINGS: LUNGS AND PLEURA: The lungs are hyperinflated with coarsened interstitial markings and diffuse bronchial wall thickening compatible with COPD/emphysema. No focal pulmonary opacity. No pulmonary edema. No pleural effusion. No pneumothorax. HEART AND MEDIASTINUM: No acute abnormality of the cardiac and mediastinal silhouettes. Aortic atherosclerotic calcification. BONES AND SOFT TISSUES: No acute osseous abnormality. IMPRESSION: 1. No acute abnormality. 2. Hyperinflated lungs with coarsened interstitial markings and diffuse bronchial wall thickening, compatible with COPD/emphysema. Electronically signed by: Waddell Calk MD 05/31/2024 09:08 AM EDT RP Workstation: [id number]    EKG: Independently reviewed.   Assessment/Plan Acute COPD exacerbation with acute hypoxic respiratory failure  - solumedrol iv , bid taper to  po prednisone  daily per protocol - doxycycline   -f/u on sputum cultures  -consider further imaging of chest if patient does not improve -cxr : NAD - nebs standing and prn  -resume chronic inhalers  -pulmonary toilet  -wean O2 as able   CHF ref  -well compensated  - lasix  , not on any other med per med rec  Anxiety  - continue ssri   DVT prophylaxis:  heparin  Code Status: full/ as discussed per patient wishes in event of cardiac arrest  Family Communication: none at bedside Disposition Plan: patient  expected to be admitted greater than 2 midnights  Consults called: n/a Admission status: cardiac tele   Camila DELENA Ned MD [facility]   If 7PM-7AM, please contact night-coverage [email or web address] Password [facility]  05/31/2024, 6:17 PM

## 2024-06-01 DIAGNOSIS — J441 Chronic obstructive pulmonary disease with (acute) exacerbation: Secondary | ICD-10-CM

## 2024-06-01 LAB — COMPREHENSIVE METABOLIC PANEL WITH GFR
ALT: 15 U/L (ref 0–44)
AST: 21 U/L (ref 15–41)
Albumin: 3.7 g/dL (ref 3.5–5.0)
Alkaline Phosphatase: 60 U/L (ref 38–126)
Anion gap: 8 (ref 5–15)
BUN: 23 mg/dL (ref 8–23)
CO2: 23 mmol/L (ref 22–32)
Calcium: 8.9 mg/dL (ref 8.9–10.3)
Chloride: 107 mmol/L (ref 98–111)
Creatinine, Ser: 0.93 mg/dL (ref 0.44–1.00)
GFR, Estimated: >60 mL/min (ref >60)
Glucose, Bld: 149 mg/dL — ABNORMAL HIGH (ref 70–99)
Potassium: 4.1 mmol/L (ref 3.5–5.1)
Sodium: 138 mmol/L (ref 135–145)
Total Bilirubin: 0.6 mg/dL (ref 0.0–1.2)
Total Protein: 6.8 g/dL (ref 6.5–8.1)

## 2024-06-01 LAB — HEMOGLOBIN A1C
Hgb A1c MFr Bld: 5.2 % (ref 4.8–5.6)
Mean Plasma Glucose: 103 mg/dL

## 2024-06-01 MED ORDER — IPRATROPIUM-ALBUTEROL 0.5-2.5 (3) MG/3ML IN SOLN
3.0000 mL | Freq: Three times a day (TID) | RESPIRATORY_TRACT | Status: DC
  Administered 2024-06-01 – 2024-06-02 (×4): 3 mL via RESPIRATORY_TRACT
  Filled 2024-06-01 (×5): qty 3

## 2024-06-01 NOTE — Plan of Care (Signed)

## 2024-06-01 NOTE — Progress Notes (Signed)
 Progress Note    Ashley Brooks  FMW:[id number] DOB: [birth year]  DOA: 05/31/2024 PCP: Glover Lenis, MD      Brief Narrative:    Medical records reviewed and are as summarized below:  Ashley Brooks is a 78 y.o. female with medical history significant for COPD, chronic hypoxic respiratory failure on oxygen as needed, systolic CHF with previous EF of 20 to 25% in February 2024 but now with recovered EF with EF of 50% on 2D echo in June 2024, CAD s/p NSTEMI, hyperlipidemia, anxiety, who presented to the hospital because of shortness of breath of about 1 week duration.  Shortness of breath has progressively worsened.  She decided to come to the ED for further evaluation.  Chest x-ray did not show any acute abnormality but there is evidence of emphysema. She was admitted to the hospital for COPD exacerbation.      Assessment/Plan:   Active Problems:   COPD exacerbation (HCC)   Body mass index is 14.45 kg/m.  (Underweight)   COPD exacerbation: Continue steroids, bronchodilators and doxycycline . She uses oxygen only as needed at home. Check pulse oximetry with ambulation.   History of systolic CHF (EF 20 to 25% in February 2024) but now with recovered EF.  EF in June 2024 was 50%.  Chronic diastolic CHF: Compensated.  Continue oral Lasix    CAD: Continue aspirin    Comorbidities include hypertension, hyperlipidemia, anxiety   Diet Order             Diet Heart Room service appropriate? Yes; Fluid consistency: Thin  Diet effective now                            Consultants: None  Procedures: None    Medications:    aspirin   81 mg Oral Daily   atorvastatin   40 mg Oral Daily   doxycycline   100 mg Oral Q12H   feeding supplement  237 mL Oral BID BM   FLUoxetine   40 mg Oral Daily   fluticasone  furoate-vilanterol  1 puff Inhalation Daily   furosemide   20 mg Oral Daily   heparin   5,000 Units Subcutaneous Q8H   ipratropium-albuterol   3  mL Nebulization TID   melatonin  5 mg Oral QHS   predniSONE   40 mg Oral Q breakfast   Continuous Infusions:   Anti-infectives (From admission, onward)    Start     Dose/Rate Route Frequency Ordered Stop   05/31/24 1015  doxycycline  (VIBRA -TABS) tablet 100 mg        100 mg Oral Every 12 hours 05/31/24 1006 06/05/24 0959              Family Communication/Anticipated D/C date and plan/Code Status   DVT prophylaxis: heparin  injection 5,000 Units Start: 05/31/24 1400     Code Status: Full Code  Family Communication: None Disposition Plan: Plan to discharge home   Status is: Inpatient Remains inpatient appropriate because: COPD exacerbation       Subjective:   Interval events noted.  She complains of shortness of breath with minimal exertion.  She said she only uses oxygen as needed at home. [place], RN, was at the bedside   Objective:    Vitals:   06/01/24 0725 06/01/24 0750 06/01/24 1128 06/01/24 1338  BP: 119/64  (!) 117/58   Pulse: 78  89   Resp: 18  16   Temp: 97.9 F (36.6 C)  97.8 F (36.6 C)  TempSrc:      SpO2: 99% 98% 97% 97%  Weight:      Height:       No data found.   Intake/Output Summary (Last 24 hours) at 06/01/2024 1523 Last data filed at 06/01/2024 1300 Gross per 24 hour  Intake 1200 ml  Output --  Net 1200 ml   Filed Weights   05/31/24 0847  Weight: 33.6 kg    Exam:  GEN: NAD, cachectic SKIN: Warm and dry EYES: EOMI ENT: MMM CV: RRR PULM: Decreased air entry bilaterally.  No wheezing heard ABD: soft, ND, NT, +BS CNS: AAO x 3, non focal EXT: No edema or tenderness        Data Reviewed:   I have personally reviewed following labs and imaging studies:  Labs: Labs show the following:   Basic Metabolic Panel: Recent Labs  Lab 05/31/24 0851 05/31/24 1124 06/01/24 0358  NA 139  --  138  K 3.8  --  4.1  CL 106  --  107  CO2 26  --  23  GLUCOSE 186*  --  149*  BUN 23  --  23  CREATININE 0.53 0.58 0.93   CALCIUM  8.5*  --  8.9  MG 4.8*  --   --    GFR Estimated Creatinine Clearance: 26.9 mL/min (by C-G formula based on SCr of 0.93 mg/dL). Liver Function Tests: Recent Labs  Lab 05/31/24 0851 06/01/24 0358  AST 21 21  ALT 17 15  ALKPHOS 63 60  BILITOT 0.7 0.6  PROT 6.2* 6.8  ALBUMIN 3.5 3.7   No results for input(s): LIPASE, AMYLASE in the last 168 hours. No results for input(s): AMMONIA in the last 168 hours. Coagulation profile No results for input(s): INR, PROTIME in the last 168 hours.  CBC: Recent Labs  Lab 05/31/24 0851 05/31/24 1124  WBC 8.2 8.3  NEUTROABS 7.2  --   HGB 11.9* 12.7  HCT 35.4* 36.9  MCV 96.5 95.6  PLT 167 146*   Cardiac Enzymes: No results for input(s): CKTOTAL, CKMB, CKMBINDEX, TROPONINI in the last 168 hours. BNP (last 3 results) No results for input(s): PROBNP in the last 8760 hours. CBG: No results for input(s): GLUCAP in the last 168 hours. D-Dimer: No results for input(s): DDIMER in the last 72 hours. Hgb A1c: Recent Labs    05/31/24 1124  HGBA1C 5.2   Lipid Profile: No results for input(s): CHOL, HDL, LDLCALC, TRIG, CHOLHDL, LDLDIRECT in the last 72 hours. Thyroid function studies: Recent Labs    05/31/24 1124  TSH 0.753   Anemia work up: No results for input(s): VITAMINB12, FOLATE, FERRITIN, TIBC, IRON, RETICCTPCT in the last 72 hours. Sepsis Labs: Recent Labs  Lab 05/31/24 0851 05/31/24 1124  WBC 8.2 8.3    Microbiology Recent Results (from the past 240 hours)  Respiratory (~20 pathogens) panel by PCR     Status: None   Collection Time: 05/31/24 11:24 AM   Specimen: Nasopharyngeal Swab; Respiratory  Result Value Ref Range Status   Adenovirus NOT DETECTED NOT DETECTED Final   Coronavirus 229E NOT DETECTED NOT DETECTED Final    Comment: (NOTE) The Coronavirus on the Respiratory Panel, DOES NOT test for the novel  Coronavirus (2019 nCoV)    Coronavirus HKU1 NOT  DETECTED NOT DETECTED Final   Coronavirus NL63 NOT DETECTED NOT DETECTED Final   Coronavirus OC43 NOT DETECTED NOT DETECTED Final   Metapneumovirus NOT DETECTED NOT DETECTED Final   Rhinovirus / Enterovirus NOT DETECTED NOT  DETECTED Final   Influenza A NOT DETECTED NOT DETECTED Final   Influenza B NOT DETECTED NOT DETECTED Final   Parainfluenza Virus 1 NOT DETECTED NOT DETECTED Final   Parainfluenza Virus 2 NOT DETECTED NOT DETECTED Final   Parainfluenza Virus 3 NOT DETECTED NOT DETECTED Final   Parainfluenza Virus 4 NOT DETECTED NOT DETECTED Final   Respiratory Syncytial Virus NOT DETECTED NOT DETECTED Final   Bordetella pertussis NOT DETECTED NOT DETECTED Final   Bordetella Parapertussis NOT DETECTED NOT DETECTED Final   Chlamydophila pneumoniae NOT DETECTED NOT DETECTED Final   Mycoplasma pneumoniae NOT DETECTED NOT DETECTED Final    Comment: Performed at [facility], [address], [place], [place] [address]    Procedures and diagnostic studies:  DG Chest Portable 1 View Result Date: 05/31/2024 EXAM: 1 VIEW XRAY OF THE CHEST 05/31/2024 09:02:00 AM COMPARISON: 10/31/2023 CLINICAL HISTORY: COPD exacerbation. Table formatting from the original note was not included.; COPD \\T \ CHF, last episode was 1 year ago. difficulty breathing started last night. albuterol  inhaler 3 times since last night. Patient feels tight in chest. 1 douneb with EMS. Mag hung with EMS. EMS states she is not working as hard as she was this morning. Patient usually wears 2L at night but was not wearing it when EMS got there. FINDINGS: LUNGS AND PLEURA: The lungs are hyperinflated with coarsened interstitial markings and diffuse bronchial wall thickening compatible with COPD/emphysema. No focal pulmonary opacity. No pulmonary edema. No pleural effusion. No pneumothorax. HEART AND MEDIASTINUM: No acute abnormality of the cardiac and mediastinal silhouettes. Aortic atherosclerotic calcification. BONES AND SOFT  TISSUES: No acute osseous abnormality. IMPRESSION: 1. No acute abnormality. 2. Hyperinflated lungs with coarsened interstitial markings and diffuse bronchial wall thickening, compatible with COPD/emphysema. Electronically signed by: Waddell Calk MD 05/31/2024 09:08 AM EDT RP Workstation: [id number]               LOS: 1 day   Jaquil Todt  [facility]   Pager on [email or web address]. If 7PM-7AM, please contact night-coverage at [email or web address]     06/01/2024, 3:23 PM

## 2024-06-02 DIAGNOSIS — J441 Chronic obstructive pulmonary disease with (acute) exacerbation: Secondary | ICD-10-CM | POA: Diagnosis not present

## 2024-06-02 MED ORDER — PREDNISONE 20 MG PO TABS: 40.0000 mg | ORAL_TABLET | Freq: Every day | ORAL | 0 refills | Status: AC

## 2024-06-02 MED ORDER — DOXYCYCLINE HYCLATE 100 MG PO TABS: 100.0000 mg | ORAL_TABLET | Freq: Two times a day (BID) | ORAL | 0 refills | Status: AC

## 2024-06-02 NOTE — TOC CM/SW Note (Signed)
 Transition of Care [facility]) - Inpatient Brief Assessment   Patient Details  Name: Ashley Brooks MRN: [id number] Date of Birth: [birth year]  Transition of Care [facility]) CM/SW Contact:    Lauraine JAYSON Carpen, LCSW Phone Number: 06/02/2024, 9:34 AM   Clinical Narrative: CSW reviewed chart. No TOC needs identified so far. CSW will continue to follow progress. Please place [facility] consult if any needs arise.  Transition of Care Asessment: Insurance and Status: Insurance coverage has been reviewed Patient has primary care physician: Yes Home environment has been reviewed: Apartment Prior level of function:: Not documented Prior/Current Home Services: No current home services Social Drivers of Health Review: SDOH reviewed no interventions necessary Readmission risk has been reviewed: Yes Transition of care needs: no transition of care needs at this time

## 2024-06-02 NOTE — Plan of Care (Signed)

## 2024-06-02 NOTE — TOC Transition Note (Signed)
 Transition of Care [facility]) - Discharge Note   Patient Details  Name: Ashley Brooks MRN: [id number] Date of Birth: 1946/08/04  Transition of Care [facility]) CM/SW Contact:  Lauraine JAYSON Carpen, LCSW Phone Number: 06/02/2024, 3:07 PM   Clinical Narrative:  Patient has orders to discharge home today. CSW met with patient. No family at bedside. CSW introduced role and explained that discharge planning would be discussed. Patient lives at [facility]. She has home oxygen through [employer] and her orders are for 2 L continuous. Patient stated she does not have family in the area and does not have a ride home or clothes for transport. CSW contacted [facility]. They will coordinate for their transportation person to [facility] and bring clothes and an oxygen tank for the ride. No further concerns. CSW signing off.   Final next level of care: Home/Self Care Barriers to Discharge: Continued Medical Work up   Patient Goals and CMS Choice            Discharge Placement                Patient to be transferred to facility by: [facility] staff   Patient and family notified of of transfer: 06/02/24  Discharge Plan and Services Additional resources added to the After Visit Summary for                                       Social Drivers of Health (SDOH) Interventions SDOH Screenings   Food Insecurity: No Food Insecurity (05/31/2024)  Housing: Low Risk  (05/31/2024)  Transportation Needs: No Transportation Needs (05/31/2024)  Utilities: Not At Risk (05/31/2024)  Financial Resource Strain: Patient Declined (04/21/2024)   Received from [facility]  Social Connections: Unknown (05/31/2024)  Tobacco Use: Medium Risk (05/31/2024)     Readmission Risk Interventions     No data to display

## 2024-06-02 NOTE — Progress Notes (Signed)
 At 0846 pt ambulated 20 ft on RA. Pt sats 79% RA. Pt placed on oxygen 2LNC at 0847. At 0849 pt's oxygen saturation was 96% on 2LNC.

## 2024-06-02 NOTE — Discharge Summary (Signed)
 Physician Discharge Summary   Patient: Ashley Brooks MRN: [id number] DOB: 1946-02-03  Admit date:     05/31/2024  Discharge date: 06/02/24  Discharge Physician: AIDA CHO   PCP: Glover Lenis, MD   Recommendations at discharge:   Follow-up with PCP in 1 week  Discharge Diagnoses: Active Problems:   COPD exacerbation (HCC)  Resolved Problems:   * No resolved hospital problems. *  Hospital Course:  Ashley Brooks is a 78 y.o. female with medical history significant for COPD, chronic hypoxic respiratory failure on oxygen as needed, systolic CHF with previous EF of 20 to 25% in February 2024 but now with recovered EF with EF of 50% on 2D echo in June 2024, CAD s/p NSTEMI, hyperlipidemia, anxiety, who presented to the hospital because of shortness of breath of about 1 week duration.  Shortness of breath has progressively worsened.  She decided to come to the ED for further evaluation.   Chest x-ray did not show any acute abnormality but there is evidence of emphysema. She was admitted to the hospital for COPD exacerbation.   Assessment and Plan:  COPD exacerbation: Improved.  She will be discharged on prednisone  to complete 5 days of treatment.  She will also be discharged on oral doxycycline  to complete 5 days of treatment.  Doxycycline .   Chronic hypoxic respiratory failure: She was using oxygen only as needed.  Oxygen saturation was 79% on room air and oxygen saturation improved to 96% on 2 L of oxygen.  It was recommended that she uses oxygen around-the-clock since she has been complaining of shortness of breath with minimal exertion.      History of systolic CHF (EF 20 to 25% in February 2024) but now with recovered EF.  EF in June 2024 was 50%.  Chronic diastolic CHF: Compensated.  Continue oral Lasix      CAD: Continue aspirin      Comorbidities include hypertension, hyperlipidemia, anxiety    Her condition has improved and she is deemed stable for discharge  to home today.       Consultants: None Procedures performed: None Disposition: Home Diet recommendation:  Discharge Diet Orders (From admission, onward)     Start     Ordered   06/02/24 0000  Diet - low sodium heart healthy        06/02/24 1444           Cardiac diet DISCHARGE MEDICATION: Allergies as of 06/02/2024   No Known Allergies      Medication List     PAUSE taking these medications    losartan  25 MG tablet Wait to take this until your doctor or other care provider tells you to start again. Commonly known as: COZAAR  Take 0.5 tablets (12.5 mg total) by mouth daily.   metoprolol  succinate 25 MG 24 hr tablet Wait to take this until your doctor or other care provider tells you to start again. Commonly known as: TOPROL -XL Take 0.5 tablets (12.5 mg total) by mouth daily.       STOP taking these medications    digoxin  0.125 MG tablet Commonly known as: LANOXIN    Stiolto Respimat 2.5-2.5 MCG/ACT Aers Generic drug: Tiotropium Bromide-Olodaterol       TAKE these medications    acetaminophen  325 MG tablet Commonly known as: TYLENOL  Take 2 tablets (650 mg total) by mouth every 4 (four) hours as needed for headache or mild pain.   aspirin  81 MG chewable tablet Chew 1 tablet (81 mg total) by mouth daily.  atorvastatin  40 MG tablet Commonly known as: LIPITOR Take 1 tablet (40 mg total) by mouth daily.   doxycycline  100 MG tablet Commonly known as: VIBRA -TABS Take 1 tablet (100 mg total) by mouth 2 (two) times daily for 5 doses.   feeding supplement Liqd Take 237 mLs by mouth 2 (two) times daily between meals.   FLUoxetine  40 MG capsule Commonly known as: PROZAC  Take 40 mg by mouth daily.   fluticasone -salmeterol 100-50 MCG/ACT Aepb Commonly known as: ADVAIR Inhale 1 puff into the lungs 2 (two) times daily.   furosemide  20 MG tablet Commonly known as: LASIX  Take 1 tablet (20 mg total) by mouth daily.   ipratropium-albuterol  0.5-2.5 (3)  MG/3ML Soln Commonly known as: DUONEB Take 3 mLs by nebulization every 4 (four) hours as needed.   melatonin 3 MG Tabs tablet Take 3 mg by mouth at bedtime.   predniSONE  20 MG tablet Commonly known as: DELTASONE  Take 2 tablets (40 mg total) by mouth daily with breakfast for 2 days. Start taking on: June 03, 2024   senna-docusate 8.6-50 MG tablet Commonly known as: Senokot-S Take 1 tablet by mouth 2 (two) times daily as needed for mild constipation.   Ventolin  HFA 108 (90 Base) MCG/ACT inhaler Generic drug: albuterol  Inhale 2 puffs into the lungs every 4 (four) hours as needed for wheezing.               Durable Medical Equipment  (From admission, onward)           Start     Ordered   06/02/24 1445  DME Oxygen  Once       Question Answer Comment  Length of Need Lifetime   Mode or (Route) Nasal cannula   Liters per Minute 2   Frequency Continuous (stationary and portable oxygen unit needed)   Oxygen conserving device Yes   Oxygen delivery system Gas      06/02/24 1444            Discharge Exam: Filed Weights   05/31/24 0847  Weight: 33.6 kg   GEN: NAD SKIN: Warm and dry EYES: No pallor or icterus ENT: MMM CV: RRR PULM: Air entry adequate bilaterally.  No wheezing or rales heard ABD: soft, ND, NT, +BS CNS: AAO x 3, non focal EXT: No edema or tenderness    Condition at discharge: good  The results of significant diagnostics from this hospitalization (including imaging, microbiology, ancillary and laboratory) are listed below for reference.   Imaging Studies: DG Chest Portable 1 View Result Date: 05/31/2024 EXAM: 1 VIEW XRAY OF THE CHEST 05/31/2024 09:02:00 AM COMPARISON: 10/31/2023 CLINICAL HISTORY: COPD exacerbation. Table formatting from the original note was not included.; COPD \\T \ CHF, last episode was 1 year ago. difficulty breathing started last night. albuterol  inhaler 3 times since last night. Patient feels tight in chest. 1 douneb with  EMS. Mag hung with EMS. EMS states she is not working as hard as she was this morning. Patient usually wears 2L at night but was not wearing it when EMS got there. FINDINGS: LUNGS AND PLEURA: The lungs are hyperinflated with coarsened interstitial markings and diffuse bronchial wall thickening compatible with COPD/emphysema. No focal pulmonary opacity. No pulmonary edema. No pleural effusion. No pneumothorax. HEART AND MEDIASTINUM: No acute abnormality of the cardiac and mediastinal silhouettes. Aortic atherosclerotic calcification. BONES AND SOFT TISSUES: No acute osseous abnormality. IMPRESSION: 1. No acute abnormality. 2. Hyperinflated lungs with coarsened interstitial markings and diffuse bronchial wall thickening, compatible with  COPD/emphysema. Electronically signed by: Waddell Calk MD 05/31/2024 09:08 AM EDT RP Workstation: [id number]    Microbiology: Results for orders placed or performed during the hospital encounter of 05/31/24  Respiratory (~20 pathogens) panel by PCR     Status: None   Collection Time: 05/31/24 11:24 AM   Specimen: Nasopharyngeal Swab; Respiratory  Result Value Ref Range Status   Adenovirus NOT DETECTED NOT DETECTED Final   Coronavirus 229E NOT DETECTED NOT DETECTED Final    Comment: (NOTE) The Coronavirus on the Respiratory Panel, DOES NOT test for the novel  Coronavirus (2019 nCoV)    Coronavirus HKU1 NOT DETECTED NOT DETECTED Final   Coronavirus NL63 NOT DETECTED NOT DETECTED Final   Coronavirus OC43 NOT DETECTED NOT DETECTED Final   Metapneumovirus NOT DETECTED NOT DETECTED Final   Rhinovirus / Enterovirus NOT DETECTED NOT DETECTED Final   Influenza A NOT DETECTED NOT DETECTED Final   Influenza B NOT DETECTED NOT DETECTED Final   Parainfluenza Virus 1 NOT DETECTED NOT DETECTED Final   Parainfluenza Virus 2 NOT DETECTED NOT DETECTED Final   Parainfluenza Virus 3 NOT DETECTED NOT DETECTED Final   Parainfluenza Virus 4 NOT DETECTED NOT DETECTED Final    Respiratory Syncytial Virus NOT DETECTED NOT DETECTED Final   Bordetella pertussis NOT DETECTED NOT DETECTED Final   Bordetella Parapertussis NOT DETECTED NOT DETECTED Final   Chlamydophila pneumoniae NOT DETECTED NOT DETECTED Final   Mycoplasma pneumoniae NOT DETECTED NOT DETECTED Final    Comment: Performed at [facility], [address], [place], [place] [address]    Labs: CBC: Recent Labs  Lab 05/31/24 0851 05/31/24 1124  WBC 8.2 8.3  NEUTROABS 7.2  --   HGB 11.9* 12.7  HCT 35.4* 36.9  MCV 96.5 95.6  PLT 167 146*   Basic Metabolic Panel: Recent Labs  Lab 05/31/24 0851 05/31/24 1124 06/01/24 0358  NA 139  --  138  K 3.8  --  4.1  CL 106  --  107  CO2 26  --  23  GLUCOSE 186*  --  149*  BUN 23  --  23  CREATININE 0.53 0.58 0.93  CALCIUM  8.5*  --  8.9  MG 4.8*  --   --    Liver Function Tests: Recent Labs  Lab 05/31/24 0851 06/01/24 0358  AST 21 21  ALT 17 15  ALKPHOS 63 60  BILITOT 0.7 0.6  PROT 6.2* 6.8  ALBUMIN 3.5 3.7   CBG: No results for input(s): GLUCAP in the last 168 hours.  Discharge time spent: greater than 30 minutes.  Signed: AIDA CHO, MD [facility] 06/02/2024

## 2024-06-02 NOTE — Plan of Care (Signed)
   Problem: Education: Goal: Knowledge of General Education information will improve Description: Including pain rating scale, medication(s)/side effects and non-pharmacologic comfort measures Outcome: Completed/Met   Problem: Health Behavior/Discharge Planning: Goal: Ability to manage health-related needs will improve Outcome: Completed/Met   Problem: Clinical Measurements: Goal: Ability to maintain clinical measurements within normal limits will improve Outcome: Completed/Met Goal: Will remain free from infection Outcome: Completed/Met Goal: Diagnostic test results will improve Outcome: Completed/Met Goal: Respiratory complications will improve Outcome: Completed/Met Goal: Cardiovascular complication will be avoided Outcome: Completed/Met   Problem: Activity: Goal: Risk for activity intolerance will decrease Outcome: Completed/Met   Problem: Nutrition: Goal: Adequate nutrition will be maintained Outcome: Completed/Met   Problem: Coping: Goal: Level of anxiety will decrease Outcome: Completed/Met   Problem: Elimination: Goal: Will not experience complications related to bowel motility Outcome: Completed/Met Goal: Will not experience complications related to urinary retention Outcome: Completed/Met   Problem: Pain Managment: Goal: General experience of comfort will improve and/or be controlled Outcome: Completed/Met   Problem: Safety: Goal: Ability to remain free from injury will improve Outcome: Completed/Met   Problem: Skin Integrity: Goal: Risk for impaired skin integrity will decrease Outcome: Completed/Met   Problem: Education: Goal: Knowledge of disease or condition will improve Outcome: Completed/Met Goal: Knowledge of the prescribed therapeutic regimen will improve Outcome: Completed/Met Goal: Individualized Educational Video(s) Outcome: Completed/Met   Problem: Activity: Goal: Ability to tolerate increased activity will improve Outcome:  Completed/Met Goal: Will verbalize the importance of balancing activity with adequate rest periods Outcome: Completed/Met   Problem: Respiratory: Goal: Ability to maintain a clear airway will improve Outcome: Completed/Met Goal: Levels of oxygenation will improve Outcome: Completed/Met Goal: Ability to maintain adequate ventilation will improve Outcome: Completed/Met

## 2024-06-02 NOTE — Care Management Important Message (Signed)
 Important Message  Patient Details  Name: TAMARRA GEISELMAN MRN: [id number] Date of Birth: 06/28/1946   Important Message Given:  Yes - Medicare IM     Rojelio [name] 06/02/2024, 12:04 PM

## 2024-07-31 ENCOUNTER — Observation Stay
Admission: EM | Admit: 2024-07-31 | Discharge: 2024-08-01 | Disposition: A | Discharge status: Home / Self Care | Attending: Internal Medicine | Admitting: Internal Medicine

## 2024-07-31 ENCOUNTER — Emergency Department

## 2024-07-31 DIAGNOSIS — R531 Weakness: Secondary | ICD-10-CM | POA: Insufficient documentation

## 2024-07-31 DIAGNOSIS — Z87891 Personal history of nicotine dependence: Secondary | ICD-10-CM | POA: Insufficient documentation

## 2024-07-31 DIAGNOSIS — F419 Anxiety disorder, unspecified: Secondary | ICD-10-CM | POA: Insufficient documentation

## 2024-07-31 DIAGNOSIS — J9611 Chronic respiratory failure with hypoxia: Secondary | ICD-10-CM | POA: Insufficient documentation

## 2024-07-31 DIAGNOSIS — Z79899 Other long term (current) drug therapy: Secondary | ICD-10-CM | POA: Insufficient documentation

## 2024-07-31 DIAGNOSIS — I110 Hypertensive heart disease with heart failure: Secondary | ICD-10-CM | POA: Diagnosis not present

## 2024-07-31 DIAGNOSIS — J441 Chronic obstructive pulmonary disease with (acute) exacerbation: Principal | ICD-10-CM | POA: Diagnosis present

## 2024-07-31 DIAGNOSIS — I5022 Chronic systolic (congestive) heart failure: Secondary | ICD-10-CM | POA: Insufficient documentation

## 2024-07-31 DIAGNOSIS — Z7982 Long term (current) use of aspirin: Secondary | ICD-10-CM | POA: Diagnosis not present

## 2024-07-31 DIAGNOSIS — R0602 Shortness of breath: Secondary | ICD-10-CM | POA: Diagnosis present

## 2024-07-31 LAB — CBC
HCT: 36.3 % (ref 36.0–46.0)
Hemoglobin: 12.3 g/dL (ref 12.0–15.0)
MCH: 32.4 pg (ref 26.0–34.0)
MCHC: 33.9 g/dL (ref 30.0–36.0)
MCV: 95.5 fL (ref 80.0–100.0)
Platelets: 199 K/uL (ref 150–400)
RBC: 3.8 MIL/uL — ABNORMAL LOW (ref 3.87–5.11)
RDW: 12.7 % (ref 11.5–15.5)
WBC: 8.4 K/uL (ref 4.0–10.5)
nRBC: 0 % (ref 0.0–0.2)

## 2024-07-31 LAB — RESP PANEL BY RT-PCR (RSV, FLU A&B, COVID)  RVPGX2
Influenza A by PCR: NEGATIVE
Influenza B by PCR: NEGATIVE
Resp Syncytial Virus by PCR: NEGATIVE
SARS Coronavirus 2 by RT PCR: NEGATIVE

## 2024-07-31 LAB — BASIC METABOLIC PANEL WITH GFR
Anion gap: 10 (ref 5–15)
BUN: 27 mg/dL — ABNORMAL HIGH (ref 8–23)
CO2: 26 mmol/L (ref 22–32)
Calcium: 8.8 mg/dL — ABNORMAL LOW (ref 8.9–10.3)
Chloride: 104 mmol/L (ref 98–111)
Creatinine, Ser: 0.69 mg/dL (ref 0.44–1.00)
GFR, Estimated: >60 mL/min (ref >60)
Glucose, Bld: 96 mg/dL (ref 70–99)
Potassium: 4.1 mmol/L (ref 3.5–5.1)
Sodium: 140 mmol/L (ref 135–145)

## 2024-07-31 LAB — TROPONIN I (HIGH SENSITIVITY)
Troponin I (High Sensitivity): 3 ng/L (ref <18)
Troponin I (High Sensitivity): 4 ng/L (ref <18)

## 2024-07-31 LAB — BRAIN NATRIURETIC PEPTIDE: B Natriuretic Peptide: 36.3 pg/mL (ref 0.0–100.0)

## 2024-07-31 MED ORDER — IPRATROPIUM-ALBUTEROL 0.5-2.5 (3) MG/3ML IN SOLN: 3.0000 mL | RESPIRATORY_TRACT | Status: DC | PRN

## 2024-07-31 MED ORDER — FLUTICASONE FUROATE-VILANTEROL 100-25 MCG/ACT IN AEPB
1.0000 | INHALATION_SPRAY | Freq: Every day | RESPIRATORY_TRACT | Status: DC
  Administered 2024-08-01: 1 via RESPIRATORY_TRACT
  Filled 2024-07-31: qty 28

## 2024-07-31 MED ORDER — ENOXAPARIN SODIUM 30 MG/0.3ML IJ SOSY
30.0000 mg | PREFILLED_SYRINGE | INTRAMUSCULAR | Status: DC
Start: 2024-08-01 — End: 2024-08-01
  Administered 2024-08-01: 30 mg via SUBCUTANEOUS
  Filled 2024-07-31: qty 0.3

## 2024-07-31 MED ORDER — IPRATROPIUM-ALBUTEROL 0.5-2.5 (3) MG/3ML IN SOLN
3.0000 mL | RESPIRATORY_TRACT | Status: DC
  Administered 2024-08-01: 3 mL via RESPIRATORY_TRACT
  Filled 2024-07-31: qty 3

## 2024-07-31 MED ORDER — ACETAMINOPHEN 325 MG PO TABS: 650.0000 mg | ORAL_TABLET | Freq: Four times a day (QID) | ORAL | Status: DC | PRN

## 2024-07-31 MED ORDER — IPRATROPIUM-ALBUTEROL 0.5-2.5 (3) MG/3ML IN SOLN
6.0000 mL | Freq: Once | RESPIRATORY_TRACT | Status: AC
  Administered 2024-07-31: 6 mL via RESPIRATORY_TRACT
  Filled 2024-07-31: qty 6

## 2024-07-31 MED ORDER — MELATONIN 5 MG PO TABS: 5.0000 mg | ORAL_TABLET | Freq: Every evening | ORAL | Status: DC | PRN

## 2024-07-31 MED ORDER — POLYETHYLENE GLYCOL 3350 17 G PO PACK: 17.0000 g | PACK | Freq: Every day | ORAL | Status: DC | PRN

## 2024-07-31 MED ORDER — PROCHLORPERAZINE EDISYLATE 10 MG/2ML IJ SOLN: 5.0000 mg | Freq: Four times a day (QID) | INTRAMUSCULAR | Status: DC | PRN

## 2024-07-31 MED ORDER — GUAIFENESIN 100 MG/5ML PO LIQD: 5.0000 mL | ORAL | Status: DC | PRN

## 2024-07-31 NOTE — ED Triage Notes (Signed)
 Pt BIB EMS for SOB for a few days. Pt had a mechanical fall requiring assistance to get up but denies injury. Pt 93% on 3L self administered. Pt had labored breathing. EMS gave 2 duo-nebs and 125mg  solu-medrol . Pt had bronchospasm with EMS.

## 2024-07-31 NOTE — ED Provider Notes (Signed)
 [facility] Provider Note    Event Date/Time   First MD Initiated Contact with Patient 07/31/24 1905     (approximate)   History   Shortness of Breath   HPI  Ashley Brooks is a 78 y.o. female who presents to the ED for evaluation of Shortness of Breath   Review medical DC summary from 2 months ago where she was admitted for COPD exacerbation.  Previous CHF but not recovered EF.  Diastolic dysfunction on Lasix .  Patient presents with shortness of breath over the past 2 days.  She reports quite similar to when she was admitted 2 months ago.  No cough or fever.  No chest pain.   Physical Exam   Triage Vital Signs: ED Triage Vitals  Encounter Vitals Group     BP 07/31/24 1853 118/62     Girls Systolic BP Percentile --      Girls Diastolic BP Percentile --      Boys Systolic BP Percentile --      Boys Diastolic BP Percentile --      Pulse Rate 07/31/24 1853 86     Resp 07/31/24 1853 (!) 24     Temp 07/31/24 1853 97.6 F (36.4 C)     Temp Source 07/31/24 1853 Oral     SpO2 07/31/24 1852 100 %     Weight 07/31/24 1855 90 lb 4.8 oz (41 kg)     Height 07/31/24 1855 4' 11 (1.499 m)     Head Circumference --      Peak Flow --      Pain Score --      Pain Loc --      Pain Education --      Exclude from Growth Chart --     Most recent vital signs: Vitals:   07/31/24 2100 07/31/24 2300  BP: (!) 124/58 (!) 99/54  Pulse: (!) 102 (!) 111  Resp: (!) 24 (!) 24  Temp:  98.4 F (36.9 C)  SpO2: 92% 96%    General: Awake, no distress.  CV:  Good peripheral perfusion.  Resp:  Tachypnea without distress.  Wheezing throughout and slightly decreased airflow  abd:  No distention.  MSK:  No deformity noted.  Neuro:  No focal deficits appreciated. Other:     ED Results / Procedures / Treatments   Labs (all labs ordered are listed, but only abnormal results are displayed) Labs Reviewed  BASIC METABOLIC PANEL WITH GFR - Abnormal; Notable for the  following components:      Result Value   BUN 27 (*)    Calcium  8.8 (*)    All other components within normal limits  CBC - Abnormal; Notable for the following components:   RBC 3.80 (*)    All other components within normal limits  RESP PANEL BY RT-PCR (RSV, FLU A&B, COVID)  [id number]  BRAIN NATRIURETIC PEPTIDE  TROPONIN I (HIGH SENSITIVITY)  TROPONIN I (HIGH SENSITIVITY)    EKG   RADIOLOGY CXR interpreted by me without evidence of acute cardiopulmonary pathology.  Official radiology report(s): DG Chest Port 1 View Result Date: 07/31/2024 CLINICAL DATA:  Shortness of breath EXAM: PORTABLE CHEST 1 VIEW COMPARISON:  05/31/2024 FINDINGS: No acute airspace disease or effusion. Normal cardiac size with aortic atherosclerosis. No pneumothorax IMPRESSION: No active disease. Electronically Signed   By: Luke Bun M.D.   On: 07/31/2024 19:32    PROCEDURES and INTERVENTIONS:  .1-3 Lead EKG Interpretation  Performed by: Claudene,  Ester, MD Authorized by: Claudene Ester, MD     Interpretation: normal     ECG rate:  92   ECG rate assessment: normal     Rhythm: sinus rhythm     Ectopy: none     Conduction: normal     Medications  ipratropium-albuterol  (DUONEB) 0.5-2.5 (3) MG/3ML nebulizer solution 6 mL (6 mLs Nebulization Given 07/31/24 2024)  ipratropium-albuterol  (DUONEB) 0.5-2.5 (3) MG/3ML nebulizer solution 6 mL (6 mLs Nebulization Given 07/31/24 2134)     IMPRESSION / MDM / ASSESSMENT AND PLAN / ED COURSE  I reviewed the triage vital signs and the nursing notes.  Differential diagnosis includes, but is not limited to, ACS, PTX, PNA, muscle strain/spasm, PE, dissection, anxiety, pleural effusion  {Patient presents with symptoms of an acute illness or injury that is potentially life-threatening.  Patient presents with evidence of a COPD exacerbation requiring medical admission due to persistent wheezing and symptoms despite multiple rounds of breathing treatments and prehospital  steroids.  Clear x-ray, negative viral swabs.  No signs of volume overload or CHF exacerbation, negative troponins.  Normal CBC and metabolic panel.  Consult medicine for admission  Clinical Course as of 07/31/24 2326  [facility] Jul 31, 2024  2017 Increased SOB for 3 days. PRN O2 at home [DS]    Clinical Course User Index [DS] Claudene Ester, MD     FINAL CLINICAL IMPRESSION(S) / ED DIAGNOSES   Final diagnoses:  COPD exacerbation (HCC)     Rx / DC Orders   ED Discharge Orders     None        Note:  This document was prepared using Dragon voice recognition software and may include unintentional dictation errors.   Claudene Ester, MD 07/31/24 [phone]

## 2024-07-31 NOTE — ED Notes (Signed)
 Pt O2 saturation dropping below 94%, pt placed on 2LPM via nasal cannula.  Provider notified

## 2024-07-31 NOTE — Progress Notes (Signed)
 PHARMACIST - PHYSICIAN COMMUNICATION  CONCERNING:  Enoxaparin  (Lovenox ) for DVT Prophylaxis    RECOMMENDATION: Patient was prescribed enoxaprin 40mg  q24 hours for VTE prophylaxis.   Filed Weights   07/31/24 1855  Weight: 41 kg (90 lb 4.8 oz)    Body mass index is 18.24 kg/m.  Estimated Creatinine Clearance: 37.5 mL/min (by C-G formula based on SCr of 0.69 mg/dL).  Patient is candidate for enoxaparin  30mg  every 24 hours based on CrCl <64ml/min or Weight <45kg  DESCRIPTION: Pharmacy has adjusted enoxaparin  dose per [facility] policy.  Patient is now receiving enoxaparin  30 mg every 24 hours   Rankin CANDIE Dills, PharmD, [facility] 07/31/2024 11:56 PM

## 2024-07-31 NOTE — H&P (Signed)
 History and Physical  Ashley Brooks FMW:[id number] DOB: [birth year] DOA: 07/31/2024  Referring physician: Dr. Claudene, EDP  PCP: Glover Lenis, MD  Outpatient Specialists: Cardiology. Patient coming from: Home.  Chief Complaint: Shortness of breath.  HPI: Ashley Brooks is a 78 y.o. female with medical history significant for COPD, chronic hypoxia on 3 L oxygen supplementation as needed, chronic HFrEF, coronary artery disease status post NSTEMI, hyperlipidemia, chronic anxiety, presents to the ER with complaints of worsening shortness of breath for the past few days.  Denies any chest pain.  Her symptoms were severe today and felt like she could not catch her breath.  Associated with minimal cough.  Last tobacco use was at least 4 years ago.  Denies exposure to secondhand smoking.  In the ER, tachycardic and tachypneic.  Diffuse wheezing heard on lung auscultation bilaterally.  Her chest x-ray was nonacute.  She received IV steroids en route via EMS and nebulized bronchodilators.  Admitted for further management of acute COPD exacerbation.  ED Course: Temperature 98.3.  BP 120/81, pulse 106, respiratory rate 20, O2 saturation 97% on 2 L.  Troponin 3, 4.  Review of Systems: Review of systems as noted in the HPI. All other systems reviewed and are negative.   Past Medical History:  Diagnosis Date   Acute hypoxic respiratory failure (HCC) 12/04/2022   Anxiety    CHF (congestive heart failure) (HCC)    COPD (chronic obstructive pulmonary disease) (HCC)    COPD exacerbation (HCC) 08/25/2018   Elevated troponin 12/03/2022   HLD (hyperlipidemia)    Hypertension    Hypokalemia 09/30/2020   Personal history of tobacco use, presenting hazards to health 12/06/2015   Past Surgical History:  Procedure Laterality Date   CATARACT EXTRACTION     LEFT HEART CATH AND CORONARY ANGIOGRAPHY Right 12/03/2022   Procedure: LEFT HEART CATH AND CORONARY ANGIOGRAPHY with possible intervention;   Surgeon: Fernand Denyse LABOR, MD;  Location: [facility] INVASIVE CV LAB;  Service: Cardiovascular;  Laterality: Right;   right foot fusion     TUBAL LIGATION      Social History:  reports that she quit smoking about 6 years ago. Her smoking use included cigarettes. She started smoking about 48 years ago. She has a 42 pack-year smoking history. She has never used smokeless tobacco. She reports current alcohol use of about 2.0 standard drinks of alcohol per week. She reports that she does not use drugs.   No Known Allergies  Family History  Problem Relation Age of Onset   Colon cancer Mother    Breast cancer Neg Hx       Prior to Admission medications   Medication Sig Start Date End Date Taking? Authorizing Provider  acetaminophen  (TYLENOL ) 325 MG tablet Take 2 tablets (650 mg total) by mouth every 4 (four) hours as needed for headache or mild pain. 12/11/22   Fausto Burnard LABOR, DO  aspirin  81 MG chewable tablet Chew 1 tablet (81 mg total) by mouth daily. 12/12/22   Fausto Burnard LABOR, DO  atorvastatin  (LIPITOR) 40 MG tablet Take 1 tablet (40 mg total) by mouth daily. 12/12/22   Fausto Burnard LABOR, DO  feeding supplement (ENSURE ENLIVE / ENSURE PLUS) LIQD Take 237 mLs by mouth 2 (two) times daily between meals. 12/11/22   Fausto Burnard LABOR, DO  FLUoxetine  (PROZAC ) 40 MG capsule Take 40 mg by mouth daily.     [provider]  fluticasone -salmeterol (ADVAIR) 100-50 MCG/ACT AEPB Inhale 1 puff into the lungs 2 (  two) times daily.    [provider]  furosemide  (LASIX ) 20 MG tablet Take 1 tablet (20 mg total) by mouth daily. 11/07/23   Trudy Anthony HERO, MD  ipratropium-albuterol  (DUONEB) 0.5-2.5 (3) MG/3ML SOLN Take 3 mLs by nebulization every 4 (four) hours as needed. 12/11/22   Fausto Burnard LABOR, DO  losartan  (COZAAR ) 25 MG tablet Take 0.5 tablets (12.5 mg total) by mouth daily. Patient not taking: Reported on 05/31/2024 12/12/22   Fausto Burnard A, DO  melatonin 3 MG TABS tablet Take 3 mg by  mouth at bedtime.    [provider]  metoprolol  succinate (TOPROL -XL) 25 MG 24 hr tablet Take 0.5 tablets (12.5 mg total) by mouth daily. Patient not taking: Reported on 05/31/2024 12/12/22   Fausto Burnard A, DO  senna-docusate (SENOKOT-S) 8.6-50 MG tablet Take 1 tablet by mouth 2 (two) times daily as needed for mild constipation. 12/11/22   Fausto Burnard LABOR, DO  VENTOLIN  HFA 108 (90 Base) MCG/ACT inhaler Inhale 2 puffs into the lungs every 4 (four) hours as needed for wheezing. 06/03/18   [provider]    Physical Exam: BP (!) 99/54   Pulse (!) 111   Temp 98.4 F (36.9 C) (Oral)   Resp (!) 24   Ht 4' 11 (1.499 m)   Wt 41 kg   SpO2 96%   BMI 18.24 kg/m   General: 78 y.o. year-old female well developed well nourished in no acute distress.  Alert and oriented x3. Cardiovascular: Regular rate and rhythm with no rubs or gallops.  No thyromegaly or JVD noted.  Trace lower extremities bilaterally. Respiratory: Diffuse wheezing.  Poor inspiratory effort. Abdomen: Soft nontender nondistended with normal bowel sounds x4 quadrants. Muskuloskeletal: No cyanosis or clubbing noted bilaterally Neuro: CN II-XII intact, strength, sensation, reflexes Skin: No ulcerative lesions noted or rashes Psychiatry: Judgement and insight appear normal. Mood is appropriate for condition and setting          Labs on Admission:  Basic Metabolic Panel: Recent Labs  Lab 07/31/24 1856  NA 140  K 4.1  CL 104  CO2 26  GLUCOSE 96  BUN 27*  CREATININE 0.69  CALCIUM  8.8*   Liver Function Tests: No results for input(s): AST, ALT, ALKPHOS, BILITOT, PROT, ALBUMIN in the last 168 hours. No results for input(s): LIPASE, AMYLASE in the last 168 hours. No results for input(s): AMMONIA in the last 168 hours. CBC: Recent Labs  Lab 07/31/24 1856  WBC 8.4  HGB 12.3  HCT 36.3  MCV 95.5  PLT 199   Cardiac Enzymes: No results for input(s): CKTOTAL, CKMB, CKMBINDEX,  TROPONINI in the last 168 hours.  BNP (last 3 results) Recent Labs    10/31/23 0505 05/31/24 0851 07/31/24 1856  BNP 328.2* 58.4 36.3    ProBNP (last 3 results) No results for input(s): PROBNP in the last 8760 hours.  CBG: No results for input(s): GLUCAP in the last 168 hours.  Radiological Exams on Admission: DG Chest Port 1 View Result Date: 07/31/2024 CLINICAL DATA:  Shortness of breath EXAM: PORTABLE CHEST 1 VIEW COMPARISON:  05/31/2024 FINDINGS: No acute airspace disease or effusion. Normal cardiac size with aortic atherosclerosis. No pneumothorax IMPRESSION: No active disease. Electronically Signed   By: Luke Bun M.D.   On: 07/31/2024 19:32    EKG: I independently viewed the EKG done and my findings are as followed: None available at the time of this visit.  Assessment/Plan Present on Admission:  COPD exacerbation (HCC)  Active Problems:   COPD exacerbation (HCC)  Acute COPD exacerbation, unclear trigger Stopped tobacco use at least 4 years ago.  Denies exposure to secondhand smoking. IV azithromycin  and IV Solu-Medrol  added. Continue bronchodilator nebulizers. As needed antitussives Early mobilization  Chronic hypoxic respiratory failure Maintain O2 saturation above 90% Prior to admission on 3 L nasal cannula as needed  Chronic anxiety disorder Resume home Xanax  as needed Resume home Prozac   Chronic HFrEF Euvolemic on exam Resume home regimen Monitor strict I's and O's and daily weight.  Generalized weakness PT OT evaluation Fall precautions.   Time: 75 minutes.   DVT prophylaxis: Subcu Lovenox  daily.  Code Status: Full code.  Family Communication: None at bedside.  Disposition Plan: Admitted to telemetry medical unit.  Consults called: None.  Admission status: Observation status.   Status is: Observation    Terry LOISE Hurst MD [facility] Pager [phone]  If 7PM-7AM, please contact  night-coverage [email or web address] Password [email or web address]  07/31/2024, 11:52 PM

## 2024-08-01 ENCOUNTER — Other Ambulatory Visit: Payer: Self-pay

## 2024-08-01 ENCOUNTER — Encounter: Payer: Self-pay | Admitting: Internal Medicine

## 2024-08-01 DIAGNOSIS — J441 Chronic obstructive pulmonary disease with (acute) exacerbation: Secondary | ICD-10-CM | POA: Diagnosis not present

## 2024-08-01 LAB — BASIC METABOLIC PANEL WITH GFR
Anion gap: 8 (ref 5–15)
BUN: 23 mg/dL (ref 8–23)
CO2: 27 mmol/L (ref 22–32)
Calcium: 8.5 mg/dL — ABNORMAL LOW (ref 8.9–10.3)
Chloride: 106 mmol/L (ref 98–111)
Creatinine, Ser: 0.63 mg/dL (ref 0.44–1.00)
GFR, Estimated: >60 mL/min (ref >60)
Glucose, Bld: 146 mg/dL — ABNORMAL HIGH (ref 70–99)
Potassium: 4.3 mmol/L (ref 3.5–5.1)
Sodium: 141 mmol/L (ref 135–145)

## 2024-08-01 LAB — CBC
HCT: 32.7 % — ABNORMAL LOW (ref 36.0–46.0)
Hemoglobin: 11.1 g/dL — ABNORMAL LOW (ref 12.0–15.0)
MCH: 32.6 pg (ref 26.0–34.0)
MCHC: 33.9 g/dL (ref 30.0–36.0)
MCV: 95.9 fL (ref 80.0–100.0)
Platelets: 138 K/uL — ABNORMAL LOW (ref 150–400)
RBC: 3.41 MIL/uL — ABNORMAL LOW (ref 3.87–5.11)
RDW: 12.6 % (ref 11.5–15.5)
WBC: 3.3 K/uL — ABNORMAL LOW (ref 4.0–10.5)
nRBC: 0 % (ref 0.0–0.2)

## 2024-08-01 LAB — PHOSPHORUS: Phosphorus: 3.1 mg/dL (ref 2.5–4.6)

## 2024-08-01 LAB — MAGNESIUM: Magnesium: 2.5 mg/dL — ABNORMAL HIGH (ref 1.7–2.4)

## 2024-08-01 MED ORDER — ALPRAZOLAM 0.25 MG PO TABS
0.2500 mg | ORAL_TABLET | ORAL | Status: AC
  Administered 2024-08-01: 0.25 mg via ORAL
  Filled 2024-08-01: qty 1

## 2024-08-01 MED ORDER — AZITHROMYCIN 250 MG PO TABS
ORAL_TABLET | ORAL | 0 refills | Status: DC
  Filled 2024-08-01: qty 6, 5d supply, fill #0

## 2024-08-01 MED ORDER — ENSURE ENLIVE PO LIQD
237.0000 mL | Freq: Two times a day (BID) | ORAL | Status: DC
  Administered 2024-08-01: 237 mL via ORAL
  Filled 2024-08-01: qty 237

## 2024-08-01 MED ORDER — IPRATROPIUM-ALBUTEROL 0.5-2.5 (3) MG/3ML IN SOLN
3.0000 mL | Freq: Four times a day (QID) | RESPIRATORY_TRACT | Status: DC
  Administered 2024-08-01: 3 mL via RESPIRATORY_TRACT
  Filled 2024-08-01: qty 3

## 2024-08-01 MED ORDER — DOXYCYCLINE HYCLATE 100 MG PO CAPS
100.0000 mg | ORAL_CAPSULE | Freq: Two times a day (BID) | ORAL | 0 refills | Status: AC
  Filled 2024-08-01: qty 10, 5d supply, fill #0

## 2024-08-01 MED ORDER — DIGOXIN 125 MCG PO TABS
0.1250 mg | ORAL_TABLET | Freq: Every day | ORAL | Status: DC
Start: 2024-08-01 — End: 2024-08-01
  Filled 2024-08-01: qty 1

## 2024-08-01 MED ORDER — GUAIFENESIN 100 MG/5ML PO LIQD
5.0000 mL | ORAL | 0 refills | Status: AC | PRN
  Filled 2024-08-01: qty 118, 4d supply, fill #0

## 2024-08-01 MED ORDER — IPRATROPIUM-ALBUTEROL 0.5-2.5 (3) MG/3ML IN SOLN
3.0000 mL | Freq: Three times a day (TID) | RESPIRATORY_TRACT | Status: DC
Start: 2024-08-01 — End: 2024-08-01
  Administered 2024-08-01: 3 mL via RESPIRATORY_TRACT
  Filled 2024-08-01: qty 3

## 2024-08-01 MED ORDER — FLUOXETINE HCL 20 MG PO CAPS
40.0000 mg | ORAL_CAPSULE | Freq: Every day | ORAL | Status: DC
  Administered 2024-08-01: 40 mg via ORAL
  Filled 2024-08-01: qty 2

## 2024-08-01 MED ORDER — FUROSEMIDE 10 MG/ML IJ SOLN
20.0000 mg | Freq: Once | INTRAMUSCULAR | Status: DC
  Filled 2024-08-01: qty 4

## 2024-08-01 MED ORDER — ATORVASTATIN CALCIUM 20 MG PO TABS
40.0000 mg | ORAL_TABLET | Freq: Every day | ORAL | Status: DC
Start: 2024-08-01 — End: 2024-08-01
  Administered 2024-08-01: 40 mg via ORAL
  Filled 2024-08-01: qty 2

## 2024-08-01 MED ORDER — PREDNISONE 20 MG PO TABS
40.0000 mg | ORAL_TABLET | Freq: Every day | ORAL | 0 refills | Status: AC
  Filled 2024-08-01: qty 10, 5d supply, fill #0

## 2024-08-01 MED ORDER — SODIUM CHLORIDE 0.9 % IV SOLN
500.0000 mg | INTRAVENOUS | Status: DC
  Administered 2024-08-01: 500 mg via INTRAVENOUS
  Filled 2024-08-01 (×2): qty 5

## 2024-08-01 MED ORDER — METHYLPREDNISOLONE SODIUM SUCC 40 MG IJ SOLR
40.0000 mg | Freq: Every day | INTRAMUSCULAR | Status: DC
  Administered 2024-08-01: 40 mg via INTRAVENOUS
  Filled 2024-08-01: qty 1

## 2024-08-01 MED ORDER — ALPRAZOLAM 0.25 MG PO TABS
0.2500 mg | ORAL_TABLET | Freq: Two times a day (BID) | ORAL | Status: DC | PRN
Start: 2024-08-01 — End: 2024-08-01

## 2024-08-01 NOTE — Evaluation (Signed)
 Physical Therapy Evaluation Patient Details Name: Ashley Brooks MRN: [id number] DOB: [birth year] Today's Date: 08/01/2024  History of Present Illness  78 y.o. female with medical history significant for COPD, chronic hypoxia on 3 L oxygen supplementation as needed, chronic HFrEF, coronary artery disease status post NSTEMI, hyperlipidemia, chronic anxiety, presents to the ER with complaints of worsening shortness of breath for the past few days. Admitted for further management of acute COPD exacerbation.  Clinical Impression  Patient seated edge of bed with OT upon arrival to room; alert and oriented, follows commands and agreeable to participation with session. Patient generally weak and deconditioned, but bilat UE/LE strength and ROM grossly symmetrical and WFL for basic transfers and gait. No focal weakness appreciated.  No pain reported.  Anxious with mobility efforts and any perceived SOB; cuing for relaxation and pursed lip breathing throughout session. Able to complete sit/stand, basic transfers and gait (40') with RW, cga. Demonstrates partially reciprocal stepping pattern with fair step height/length; decreased cadence, decreased endurance/overall activity tolerance. Required standing rest period after 20' for breath control. Desat to 84% with exertion on RA; requiring seated rest break and reapplication of 2L O2 for recovery >90%. RN/MD informed/aware  Would benefit from skilled PT to address above deficits and promote optimal return to PLOF.; recommend post-acute PT follow up as indicated by interdisciplinary care team.            If plan is discharge home, recommend the following: A little help with walking and/or transfers;A little help with bathing/dressing/bathroom   Can travel by private vehicle        Equipment Recommendations    Recommendations for Other Services       Functional Status Assessment       Precautions / Restrictions Precautions Precautions: Fall       Mobility  Bed Mobility Overal bed mobility: Modified Independent (Simultaneous filing. User may not have seen previous data.)                  Transfers Overall transfer level: Needs assistance (Simultaneous filing. User may not have seen previous data.) Equipment used: Rolling walker (2 wheels) (Simultaneous filing. User may not have seen previous data.) Transfers: Sit to/from Stand (Simultaneous filing. User may not have seen previous data.) Sit to Stand: Contact guard assist (Simultaneous filing. User may not have seen previous data.)                Ambulation/Gait Ambulation/Gait assistance: Contact guard assist Gait Distance (Feet): 40 Feet Assistive device: Rolling walker (2 wheels)         General Gait Details: partially reciprocal stepping pattern with fair step height/length; decreased cadence, decreased endurance/overall activity tolerance.  Required standing rest period after 20' for breath control.  Desat to 84% with exertion on RA; requiring seated rest break and reapplication of 2L O2 for recovery >90%.  RN/MD informed/aware  Stairs            Wheelchair Mobility     Tilt Bed    Modified Rankin (Stroke Patients Only)       Balance Overall balance assessment: Needs assistance                                           Pertinent Vitals/Pain      Home Living Family/patient expects to be discharged to:: Assisted living  Home Equipment: Agricultural consultant (2 wheels);Rollator (4 wheels);Shower seat Additional Comments: [employer]    Prior Function Prior Level of Function : Independent/Modified Independent             Mobility Comments: Ambulates w/ rollator within room and to/from dining hall for meals.  Does endorse at least 1 recent fall.  Home O2 when I need it ADLs Comments: Pt performs most B/IADLs INDly. Facility provides 3 meals/day, light housekeeping, and transportation. Pt able to  dress, toilet, bathe, do laundry w/o assist. She does not drive, 2/2 baseline visual impairment.     Extremity/Trunk Assessment   Upper Extremity Assessment Upper Extremity Assessment: Generalized weakness    Lower Extremity Assessment Lower Extremity Assessment: Generalized weakness (grossly at least 4-/5 throughout; no focal weakness appreciated)       Communication   Communication Communication: Impaired Factors Affecting Communication: Hearing impaired    Cognition Arousal: Alert Behavior During Therapy: WFL for tasks assessed/performed, Anxious   PT - Cognitive impairments: No apparent impairments                                 Cueing       General Comments General comments (skin integrity, edema, etc.): on RA at rest SpO2 94%, down to 84% with limited mobility, 2L applied and came back up with seated rest break to 98% on 2L    Exercises     Assessment/Plan    PT Assessment Patient needs continued PT services  PT Problem List Decreased activity tolerance;Decreased balance;Decreased mobility;Decreased knowledge of use of DME;Decreased safety awareness;Decreased knowledge of precautions;Cardiopulmonary status limiting activity       PT Treatment Interventions DME instruction;Gait training;Functional mobility training;Therapeutic activities;Therapeutic exercise;Balance training;Cognitive remediation;Patient/family education    PT Goals (Current goals can be found in the Care Plan section)  Acute Rehab PT Goals Patient Stated Goal: to get my walker back PT Goal Formulation: With patient Time For Goal Achievement: 08/15/24 Potential to Achieve Goals: Good    Frequency Min 2X/week     Co-evaluation               AM-PAC PT 6 Clicks Mobility  Outcome Measure Help needed turning from your back to your side while in a flat bed without using bedrails?: None Help needed moving from lying on your back to sitting on the side of a flat bed  without using bedrails?: None Help needed moving to and from a bed to a chair (including a wheelchair)?: A Little Help needed standing up from a chair using your arms (e.g., wheelchair or bedside chair)?: A Little Help needed to walk in hospital room?: A Little Help needed climbing 3-5 steps with a railing? : A Little 6 Click Score: 20    End of Session   Activity Tolerance: Patient tolerated treatment well Patient left: in chair;with call bell/phone within reach;with chair alarm set Nurse Communication: Mobility status PT Visit Diagnosis: Muscle weakness (generalized) (M62.81);Difficulty in walking, not elsewhere classified (R26.2)    Time: 1245-1301 PT Time Calculation (min) (ACUTE ONLY): 16 min   Charges:   PT Evaluation $PT Eval Moderate Complexity: 1 Mod   PT General Charges $$ ACUTE PT VISIT: 1 Visit        Reshawn Ostlund H. Delores, PT, DPT, NCS 08/01/24, 1:46 PM [phone]

## 2024-08-01 NOTE — Evaluation (Signed)
 Occupational Therapy Evaluation Patient Details Name: Ashley Brooks MRN: [id number] DOB: 06-29-1946 Today's Date: 08/01/2024   History of Present Illness   78 y.o. female with medical history significant for COPD, chronic hypoxia on 3 L oxygen supplementation as needed, chronic HFrEF, coronary artery disease status post NSTEMI, hyperlipidemia, chronic anxiety, presents to the ER with complaints of worsening shortness of breath for the past few days. Admitted for further management of acute COPD exacerbation.     Clinical Impressions Pt was seen for OT evaluation this date. Prior to hospital admission, pt was indep with ADL, using a rollator for mobility to/from dining hall from her ALF apartment, and using O2 PRN when SOB. She endorses that her baseline visual deficits make it difficult to know how much O2 she uses when she needs it. Pt presents with deficits in strength, balance, activity tolerance, and cardiopulmonary status, affecting safe and optimal ADL completion. Pt currently requires CGA for ADL transfers and mobility using 2WW. Desats to 84% on RA with limited mobility and very SOB. Returned to sitting with 2L and came back up to 98%. Pt would benefit from skilled OT services to address noted impairments and functional limitations (see below for any additional details) in order to maximize safety and independence while minimizing future risk of falls, injury, and readmission. Anticipate the need for follow up OT services upon acute hospital DC.     If plan is discharge home, recommend the following:   A little help with walking and/or transfers;A little help with bathing/dressing/bathroom;Assistance with cooking/housework;Assist for transportation;Help with stairs or ramp for entrance;Other (comment) (assist for O2 mgt)     Functional Status Assessment   Patient has had a recent decline in their functional status and demonstrates the ability to make significant improvements in  function in a reasonable and predictable amount of time.     Equipment Recommendations   None recommended by OT     Recommendations for Other Services         Precautions/Restrictions   Precautions Precautions: Fall Precaution/Restrictions Comments: monitor O2 Restrictions Weight Bearing Restrictions Per Provider Order: No     Mobility Bed Mobility                 Patient Response: Anxious  Transfers                          Balance Overall balance assessment: Needs assistance Sitting-balance support: No upper extremity supported, Feet supported Sitting balance-Leahy Scale: Good     Standing balance support: Bilateral upper extremity supported Standing balance-Leahy Scale: Fair                             ADL either performed or assessed with clinical judgement   ADL Overall ADL's : Modified independent                                       General ADL Comments: Increased time/effort to complete     Vision         Perception         Praxis         Pertinent Vitals/Pain       Extremity/Trunk Assessment Upper Extremity Assessment Upper Extremity Assessment: Generalized weakness   Lower Extremity Assessment Lower Extremity Assessment: Generalized weakness (grossly at least 4-/5  throughout; no focal weakness appreciated)       Communication Communication Communication: Impaired Factors Affecting Communication: Hearing impaired   Cognition     Cognition: No apparent impairments                               Following commands: Intact       Cueing  General Comments   Cueing Techniques: Verbal cues  on RA at rest SpO2 94%, down to 84% with limited mobility, 2L applied and came back up with seated rest break to 98% on 2L   Exercises Other Exercises Other Exercises: edu PLB, introduced ECS   Shoulder Instructions      Home Living Family/patient expects to be  discharged to:: Assisted living                             Home Equipment: Rolling Walker (2 wheels);Rollator (4 wheels);Shower seat   Additional Comments: [name]      Prior Functioning/Environment Prior Level of Function : Independent/Modified Independent             Mobility Comments: Ambulates w/ rollator within room and to/from dining hall for meals.  Does endorse at least 1 recent fall.  Home O2 when I need it ADLs Comments: Pt performs most B/IADLs INDly. Facility provides 3 meals/day, light housekeeping, and transportation. Pt able to dress, toilet, bathe, do laundry w/o assist. She does not drive, 2/2 baseline visual impairment.    OT Problem List: Decreased strength;Decreased activity tolerance;Impaired balance (sitting and/or standing);Decreased knowledge of use of DME or AE   OT Treatment/Interventions: Self-care/ADL training;Therapeutic exercise;Therapeutic activities;Energy conservation;DME and/or AE instruction;Patient/family education;Balance training      OT Goals(Current goals can be found in the care plan section)   Acute Rehab OT Goals Patient Stated Goal: go home OT Goal Formulation: With patient Time For Goal Achievement: 08/15/24 Potential to Achieve Goals: Good ADL Goals Additional ADL Goal #1: Pt will verbalize plan to implement at least 2 learned ECS to support ADL/mobility.   OT Frequency:  Min 2X/week    Co-evaluation              AM-PAC OT 6 Clicks Daily Activity     Outcome Measure Help from another person eating meals?: None Help from another person taking care of personal grooming?: None Help from another person toileting, which includes using toliet, bedpan, or urinal?: A Little Help from another person bathing (including washing, rinsing, drying)?: A Little Help from another person to put on and taking off regular upper body clothing?: None Help from another person to put on and taking off regular lower body  clothing?: A Little 6 Click Score: 21   End of Session Equipment Utilized During Treatment: Oxygen;Rolling walker (2 wheels) Nurse Communication: Mobility status;Other (comment) (O2)  Activity Tolerance: Other (comment) (SOB) Patient left: in chair;with call bell/phone within reach;with chair alarm set  OT Visit Diagnosis: Other abnormalities of gait and mobility (R26.89);Muscle weakness (generalized) (M62.81)                Time: 8765-8698 OT Time Calculation (min): 27 min Charges:  OT General Charges $OT Visit: 1 Visit OT Evaluation $OT Eval Low Complexity: 1 Low  Warren SAUNDERS., MPH, MS, OTR/L ascom [phone] 08/01/24, 1:49 PM

## 2024-08-01 NOTE — Plan of Care (Signed)
  Problem: Clinical Measurements: Goal: Ability to maintain clinical measurements within normal limits will improve Outcome: Progressing Goal: Will remain free from infection Outcome: Progressing Goal: Diagnostic test results will improve Outcome: Progressing Goal: Respiratory complications will improve Outcome: Progressing Goal: Cardiovascular complication will be avoided Outcome: Progressing   Problem: Education: Goal: Knowledge of General Education information will improve Description: Including pain rating scale, medication(s)/side effects and non-pharmacologic comfort measures Outcome: Progressing   Problem: Nutrition: Goal: Adequate nutrition will be maintained Outcome: Progressing   Problem: Activity: Goal: Risk for activity intolerance will decrease Outcome: Progressing   Problem: Coping: Goal: Level of anxiety will decrease Outcome: Progressing   Problem: Elimination: Goal: Will not experience complications related to bowel motility Outcome: Progressing Goal: Will not experience complications related to urinary retention Outcome: Progressing   Problem: Pain Managment: Goal: General experience of comfort will improve and/or be controlled Outcome: Progressing   Problem: Safety: Goal: Ability to remain free from injury will improve Outcome: Progressing   Problem: Skin Integrity: Goal: Risk for impaired skin integrity will decrease Outcome: Progressing

## 2024-08-01 NOTE — Discharge Summary (Addendum)
 Physician Discharge Summary   Patient: Ashley Brooks MRN: [id number] DOB: [birth year]  Admit date:     07/31/2024  Discharge date: 08/01/24  Discharge Physician: Amaryllis Dare   PCP: Glover Lenis, MD   Recommendations at discharge:  Please obtain CBC and BMP on follow-up Please ensure completion of doxycycline  and prednisone  Continue with home oxygen to keep the saturation above 90% Follow-up with primary care provider  Discharge Diagnoses: Active Problems:   COPD exacerbation [facility])   Hospital Course: Partly taken from H&P.  Ashley Brooks is a 78 y.o. female with medical history significant for COPD, chronic hypoxia on 3 L oxygen supplementation as needed, chronic HFrEF, coronary artery disease status post NSTEMI, hyperlipidemia, chronic anxiety, presents to the ER with complaints of worsening shortness of breath for the past few days.  Denies any chest pain.   On presentation patient was afebrile, tachycardic and tachypneic with diffuse wheezing.  She received IV steroid, nebulized bronchodilator and admitted for COPD exacerbation.  10/4: Vital and labs stable, saturating 100% on 2 L of oxygen, uses 3 L as needed at home.  BNP normal at 36, respiratory panel negative for COVID, influenza and RSV.  Patient with no more wheezing and feeling at baseline.  Patient was desaturating with ambulation.  Patient need to use at least 2 L of oxygen all the time.  Patient is stable for discharge.  She is being discharged on doxycycline  and 5 days of prednisone  for concern of COPD exacerbation.  No more wheezing today.  She will continue the rest of her home medications as she was doing it before and follow-up with her providers for further assistance.  Consultants: None Procedures performed: None Disposition: Home Diet recommendation:  Discharge Diet Orders (From admission, onward)     Start     Ordered   08/01/24 0000  Diet - low sodium heart healthy        08/01/24 1242            Cardiac diet DISCHARGE MEDICATION: Allergies as of 08/01/2024   No Known Allergies      Medication List     PAUSE taking these medications    losartan  25 MG tablet Wait to take this until your doctor or other care provider tells you to start again. Commonly known as: COZAAR  Take 0.5 tablets (12.5 mg total) by mouth daily.       TAKE these medications    acetaminophen  325 MG tablet Commonly known as: TYLENOL  Take 2 tablets (650 mg total) by mouth every 4 (four) hours as needed for headache or mild pain.   ALPRAZolam  0.25 MG tablet Commonly known as: XANAX  Take 0.25 mg by mouth every 8 (eight) hours as needed.   atorvastatin  40 MG tablet Commonly known as: LIPITOR Take 1 tablet (40 mg total) by mouth daily.   digoxin  0.125 MG tablet Commonly known as: LANOXIN  Take 0.125 mg by mouth daily.   doxycycline  100 MG tablet Commonly known as: VIBRA -TABS Take 1 tablet (100 mg total) by mouth 2 (two) times daily for 5 days.   feeding supplement Liqd Take 237 mLs by mouth 2 (two) times daily between meals.   FLUoxetine  40 MG capsule Commonly known as: PROZAC  Take 40 mg by mouth daily.   fluticasone -salmeterol 100-50 MCG/ACT Aepb Commonly known as: ADVAIR Inhale 1 puff into the lungs 2 (two) times daily.   furosemide  20 MG tablet Commonly known as: LASIX  Take 1 tablet (20 mg total) by mouth daily.  guaiFENesin  100 MG/5ML liquid Commonly known as: ROBITUSSIN Take 5 mLs by mouth every 4 (four) hours as needed for cough or to loosen phlegm.   ipratropium-albuterol  0.5-2.5 (3) MG/3ML Soln Commonly known as: DUONEB Take 3 mLs by nebulization every 4 (four) hours as needed.   predniSONE  20 MG tablet Commonly known as: DELTASONE  Take 2 tablets (40 mg total) by mouth daily with breakfast for 5 days.   Ventolin  HFA 108 (90 Base) MCG/ACT inhaler Generic drug: albuterol  Inhale 2 puffs into the lungs every 4 (four) hours as needed for wheezing.         Follow-up Information     Glover Lenis, MD. Schedule an appointment as soon as possible for a visit in 1 week(s).   Specialty: Family Medicine Contact information: [address] [place] [address] [phone]                Discharge Exam: Ashley Brooks   07/31/24 1855 08/01/24 0002 08/01/24 0448  Weight: 41 kg 40.9 kg 40 kg   General.  Frail and severely malnourished elderly lady, in no acute distress. Pulmonary.  Lungs clear bilaterally, normal respiratory effort. CV.  Regular rate and rhythm, no JVD, rub or murmur. Abdomen.  Soft, nontender, nondistended, BS positive. CNS.  Alert and oriented .  No focal neurologic deficit. Extremities.  No edema, no cyanosis, pulses intact and symmetrical.  Condition at discharge: stable  The results of significant diagnostics from this hospitalization (including imaging, microbiology, ancillary and laboratory) are listed below for reference.   Imaging Studies: DG Chest Port 1 View Result Date: 07/31/2024 CLINICAL DATA:  Shortness of breath EXAM: PORTABLE CHEST 1 VIEW COMPARISON:  05/31/2024 FINDINGS: No acute airspace disease or effusion. Normal cardiac size with aortic atherosclerosis. No pneumothorax IMPRESSION: No active disease. Electronically Signed   By: Luke Bun M.D.   On: 07/31/2024 19:32    Microbiology: Results for orders placed or performed during the hospital encounter of 07/31/24  Resp panel by RT-PCR (RSV, Flu A&B, Covid) Anterior Nasal Swab     Status: None   Collection Time: 07/31/24  6:56 PM   Specimen: Anterior Nasal Swab  Result Value Ref Range Status   SARS Coronavirus 2 by RT PCR NEGATIVE NEGATIVE Final    Comment: (NOTE) SARS-CoV-2 target nucleic acids are NOT DETECTED.  The SARS-CoV-2 RNA is generally detectable in upper respiratory specimens during the acute phase of infection. The lowest concentration of SARS-CoV-2 viral copies this assay can detect is 138 copies/mL. A negative result  does not preclude SARS-Cov-2 infection and should not be used as the sole basis for treatment or other patient management decisions. A negative result may occur with  improper specimen collection/handling, submission of specimen other than nasopharyngeal swab, presence of viral mutation(s) within the areas targeted by this assay, and inadequate number of viral copies(<138 copies/mL). A negative result must be combined with clinical observations, patient history, and epidemiological information. The expected result is Negative.  Fact Sheet for Patients:  BloggerCourse.com  Fact Sheet for Healthcare Providers:  SeriousBroker.it  This test is no t yet approved or cleared by the [place]  FDA and  has been authorized for detection and/or diagnosis of SARS-CoV-2 by FDA under an Emergency Use Authorization (EUA). This EUA will remain  in effect (meaning this test can be used) for the duration of the COVID-19 declaration under Section 564(b)(1) of the Act, 21 U.S.C.section 360bbb-3(b)(1), unless the authorization is terminated  or revoked sooner.  Influenza A by PCR NEGATIVE NEGATIVE Final   Influenza B by PCR NEGATIVE NEGATIVE Final    Comment: (NOTE) The Xpert Xpress SARS-CoV-2/FLU/RSV plus assay is intended as an aid in the diagnosis of influenza from Nasopharyngeal swab specimens and should not be used as a sole basis for treatment. Nasal washings and aspirates are unacceptable for Xpert Xpress SARS-CoV-2/FLU/RSV testing.  Fact Sheet for Patients: BloggerCourse.com  Fact Sheet for Healthcare Providers: SeriousBroker.it  This test is not yet approved or cleared by the [place]  FDA and has been authorized for detection and/or diagnosis of SARS-CoV-2 by FDA under an Emergency Use Authorization (EUA). This EUA will remain in effect (meaning this test can be used) for  the duration of the COVID-19 declaration under Section 564(b)(1) of the Act, 21 U.S.C. section 360bbb-3(b)(1), unless the authorization is terminated or revoked.     Resp Syncytial Virus by PCR NEGATIVE NEGATIVE Final    Comment: (NOTE) Fact Sheet for Patients: BloggerCourse.com  Fact Sheet for Healthcare Providers: SeriousBroker.it  This test is not yet approved or cleared by the [place]  FDA and has been authorized for detection and/or diagnosis of SARS-CoV-2 by FDA under an Emergency Use Authorization (EUA). This EUA will remain in effect (meaning this test can be used) for the duration of the COVID-19 declaration under Section 564(b)(1) of the Act, 21 U.S.C. section 360bbb-3(b)(1), unless the authorization is terminated or revoked.  Performed at [facility], [address] [place] [address] [place], [place] [address]     Labs: CBC: Recent Labs  Lab 07/31/24 1856 08/01/24 0543  WBC 8.4 3.3*  HGB 12.3 11.1*  HCT 36.3 32.7*  MCV 95.5 95.9  PLT 199 138*   Basic Metabolic Panel: Recent Labs  Lab 07/31/24 1856 08/01/24 0543  NA 140 141  K 4.1 4.3  CL 104 106  CO2 26 27  GLUCOSE 96 146*  BUN 27* 23  CREATININE 0.69 0.63  CALCIUM  8.8* 8.5*  MG  --  2.5*  PHOS  --  3.1   Liver Function Tests: No results for input(s): AST, ALT, ALKPHOS, BILITOT, PROT, ALBUMIN in the last 168 hours. CBG: No results for input(s): GLUCAP in the last 168 hours.  Discharge time spent: greater than 30 minutes.  This record has been created using Conservation officer, historic buildings. Errors have been sought and corrected,but may not always be located. Such creation errors do not reflect on the standard of care.   Signed: Amaryllis Dare, MD [facility] 08/01/2024

## 2024-08-01 NOTE — TOC Transition Note (Signed)
 Transition of Care [facility]) - Discharge Note   Patient Details  Name: Ashley Brooks MRN: [id number] Date of Birth: February 06, 1946  [facility]) CM/SW Contact:  Victory Jackquline RAMAN, RN Phone Number: 08/01/2024, 3:14 PM   Clinical Narrative:   Chart reviewed. RNCM, spoke with the patient at the bedside. I introduced myself, my role, and explained that discharge planning recommendations would be discussed. PT recommended Home with Home Patient declining HH at this time, states that she doesn't feel like she needs it and doesn't want to waste their time. I left the list of HH agencies with her in case she changes her mind once she gets home. Pt has home O2 concentrator and a portable tank at home and is om O2 continuously 3L now. Patient has discharge orders for today. No further concerns. RNCM Signing off.     Final next level of care: Home/Self Care    Patient Goals and CMS Choice   CMS Medicare.gov Compare Post Acute Care list provided to:: Patient Choice offered to / list presented to : Patient      Discharge Placement                Patient to be transferred to facility by: [facility] Name of family member notified: Patient and Marcey at [facility] Patient and family notified of of transfer: 08/01/24  Discharge Plan and Services Additional resources added to the After Visit Summary for                                       Social Drivers of Health (SDOH) Interventions SDOH Screenings   Food Insecurity: No Food Insecurity (08/01/2024)  Housing: Low Risk  (08/01/2024)  Transportation Needs: No Transportation Needs (08/01/2024)  Utilities: Not At Risk (08/01/2024)  Financial Resource Strain: Patient Declined (04/21/2024)   Received from [facility]  Social Connections: Socially Isolated (08/01/2024)  Tobacco Use: Medium Risk (08/01/2024)     Readmission Risk Interventions     No data to display

## 2024-08-01 NOTE — Care Management Obs Status (Signed)
 MEDICARE OBSERVATION STATUS NOTIFICATION   Patient Details  Name: Ashley Brooks MRN: [id number] Date of Birth: Apr 26, 1946   Medicare Observation Status Notification Given:  Yes    Rojelio [name] 08/01/2024, 1:39 PM

## 2024-08-01 NOTE — Hospital Course (Addendum)
 Partly taken from H&P.  Ashley Brooks is a 78 y.o. female with medical history significant for COPD, chronic hypoxia on 3 L oxygen supplementation as needed, chronic HFrEF, coronary artery disease status post NSTEMI, hyperlipidemia, chronic anxiety, presents to the ER with complaints of worsening shortness of breath for the past few days.  Denies any chest pain.   On presentation patient was afebrile, tachycardic and tachypneic with diffuse wheezing.  She received IV steroid, nebulized bronchodilator and admitted for COPD exacerbation.  10/4: Vital and labs stable, saturating 100% on 2 L of oxygen, uses 3 L as needed at home.  BNP normal at 36, respiratory panel negative for COVID, influenza and RSV.  Patient with no more wheezing and feeling at baseline.  Patient is stable for discharge.  She is being discharged on doxycycline  and 5 days of prednisone  for concern of COPD exacerbation.  No more wheezing today.  She will continue the rest of her home medications as she was doing it before and follow-up with her providers for further assistance.

## 2024-08-01 NOTE — Plan of Care (Signed)

## 2024-08-02 ENCOUNTER — Other Ambulatory Visit: Payer: Self-pay
